# Patient Record
Sex: Female | Born: 1945
Health system: Southern US, Community
[De-identification: ages and names within clinical notes are randomized; demographics above are authoritative.]

## PROBLEM LIST (undated history)

## (undated) DIAGNOSIS — E538 Deficiency of other specified B group vitamins: Secondary | ICD-10-CM

## (undated) DIAGNOSIS — N35919 Unspecified urethral stricture, male, unspecified site: Secondary | ICD-10-CM

## (undated) DIAGNOSIS — K219 Gastro-esophageal reflux disease without esophagitis: Secondary | ICD-10-CM

## (undated) DIAGNOSIS — F419 Anxiety disorder, unspecified: Secondary | ICD-10-CM

## (undated) DIAGNOSIS — K449 Diaphragmatic hernia without obstruction or gangrene: Secondary | ICD-10-CM

## (undated) DIAGNOSIS — G4733 Obstructive sleep apnea (adult) (pediatric): Secondary | ICD-10-CM

## (undated) DIAGNOSIS — E049 Nontoxic goiter, unspecified: Secondary | ICD-10-CM

## (undated) DIAGNOSIS — K760 Fatty (change of) liver, not elsewhere classified: Secondary | ICD-10-CM

## (undated) HISTORY — DX: Unspecified urethral stricture, male, unspecified site: N35.919

## (undated) HISTORY — PX: APPENDECTOMY: SHX54

## (undated) HISTORY — DX: Obstructive sleep apnea (adult) (pediatric): G47.33

## (undated) HISTORY — DX: Diaphragmatic hernia without obstruction or gangrene: K44.9

## (undated) HISTORY — DX: Gastro-esophageal reflux disease without esophagitis: K21.9

## (undated) HISTORY — DX: Nontoxic goiter, unspecified: E04.9

## (undated) HISTORY — DX: Anxiety disorder, unspecified: F41.9

## (undated) HISTORY — DX: Fatty (change of) liver, not elsewhere classified: K76.0

## (undated) HISTORY — DX: Deficiency of other specified B group vitamins: E53.8

---

## 2000-11-26 ENCOUNTER — Encounter: Admission: RE | Admit: 2000-11-26 | Discharge: 2001-02-24 | Payer: Self-pay | Admitting: Internal Medicine

## 2001-09-21 ENCOUNTER — Emergency Department (HOSPITAL_COMMUNITY): Admission: EM | Admit: 2001-09-21 | Discharge: 2001-09-21 | Payer: Self-pay | Admitting: Emergency Medicine

## 2001-09-21 ENCOUNTER — Encounter: Payer: Self-pay | Admitting: Emergency Medicine

## 2003-07-11 HISTORY — PX: ESOPHAGOGASTRODUODENOSCOPY: SHX1529

## 2003-09-02 ENCOUNTER — Encounter: Admission: RE | Admit: 2003-09-02 | Discharge: 2003-09-02 | Payer: Self-pay | Admitting: Internal Medicine

## 2003-09-04 ENCOUNTER — Encounter: Admission: RE | Admit: 2003-09-04 | Discharge: 2003-09-04 | Payer: Self-pay | Admitting: Internal Medicine

## 2003-11-17 ENCOUNTER — Emergency Department (HOSPITAL_COMMUNITY): Admission: EM | Admit: 2003-11-17 | Discharge: 2003-11-18 | Payer: Self-pay | Admitting: Emergency Medicine

## 2003-11-19 ENCOUNTER — Ambulatory Visit (HOSPITAL_BASED_OUTPATIENT_CLINIC_OR_DEPARTMENT_OTHER): Admission: RE | Admit: 2003-11-19 | Discharge: 2003-11-19 | Payer: Self-pay | Admitting: Urology

## 2003-12-09 ENCOUNTER — Encounter: Payer: Self-pay | Admitting: Gastroenterology

## 2004-05-02 ENCOUNTER — Encounter: Payer: Self-pay | Admitting: Gastroenterology

## 2004-10-24 ENCOUNTER — Ambulatory Visit: Payer: Self-pay | Admitting: Internal Medicine

## 2004-11-11 ENCOUNTER — Ambulatory Visit: Payer: Self-pay | Admitting: Internal Medicine

## 2004-12-14 ENCOUNTER — Ambulatory Visit: Payer: Self-pay | Admitting: Internal Medicine

## 2005-02-27 ENCOUNTER — Ambulatory Visit: Payer: Self-pay | Admitting: Internal Medicine

## 2005-03-22 ENCOUNTER — Ambulatory Visit: Payer: Self-pay | Admitting: Internal Medicine

## 2005-06-22 ENCOUNTER — Ambulatory Visit: Payer: Self-pay | Admitting: Gastroenterology

## 2006-04-30 ENCOUNTER — Ambulatory Visit: Payer: Self-pay | Admitting: Internal Medicine

## 2006-04-30 LAB — CONVERTED CEMR LAB
ALT: 38 units/L (ref 0–40)
CO2: 26 meq/L (ref 19–32)
Calcium: 9.1 mg/dL (ref 8.4–10.5)
Chloride: 105 meq/L (ref 96–112)
Creatinine, Ser: 1.1 mg/dL (ref 0.4–1.2)
Free T4: 0.6 ng/dL — ABNORMAL LOW (ref 0.9–1.8)
Glucose, Bld: 101 mg/dL — ABNORMAL HIGH (ref 70–99)
Hemoglobin: 12.6 g/dL (ref 12.0–15.0)
MCV: 94.6 fL (ref 78.0–100.0)
Platelets: 172 10*3/uL (ref 150–400)
RBC: 3.98 M/uL (ref 3.87–5.11)
RDW: 12.7 % (ref 11.5–14.6)
Sodium: 139 meq/L (ref 135–145)
T3, Free: 2.6 pg/mL (ref 2.3–4.2)
TSH: 3.33 microintl units/mL (ref 0.35–5.50)

## 2006-06-29 ENCOUNTER — Ambulatory Visit: Payer: Self-pay | Admitting: Gastroenterology

## 2006-12-10 ENCOUNTER — Ambulatory Visit: Payer: Self-pay | Admitting: Internal Medicine

## 2006-12-10 DIAGNOSIS — R5381 Other malaise: Secondary | ICD-10-CM | POA: Insufficient documentation

## 2006-12-10 DIAGNOSIS — R5383 Other fatigue: Secondary | ICD-10-CM

## 2006-12-10 DIAGNOSIS — N951 Menopausal and female climacteric states: Secondary | ICD-10-CM | POA: Insufficient documentation

## 2006-12-10 DIAGNOSIS — F411 Generalized anxiety disorder: Secondary | ICD-10-CM | POA: Insufficient documentation

## 2006-12-28 LAB — CONVERTED CEMR LAB
Basophils Absolute: 0 10*3/uL (ref 0.0–0.1)
Basophils Relative: 0.4 % (ref 0.0–1.0)
Free T4: 0.7 ng/dL (ref 0.6–1.6)
HCT: 36.7 % (ref 36.0–46.0)
Hemoglobin: 12.8 g/dL (ref 12.0–15.0)
MCHC: 34.8 g/dL (ref 30.0–36.0)
Monocytes Absolute: 0.3 10*3/uL (ref 0.2–0.7)
Neutrophils Relative %: 52.3 % (ref 43.0–77.0)
RBC: 3.93 M/uL (ref 3.87–5.11)
RDW: 12.8 % (ref 11.5–14.6)
T3, Free: 2.6 pg/mL (ref 2.3–4.2)
TSH: 3.26 microintl units/mL (ref 0.35–5.50)
WBC: 3.3 10*3/uL — ABNORMAL LOW (ref 4.5–10.5)

## 2007-01-02 ENCOUNTER — Encounter (INDEPENDENT_AMBULATORY_CARE_PROVIDER_SITE_OTHER): Payer: Self-pay | Admitting: *Deleted

## 2007-02-12 ENCOUNTER — Encounter: Payer: Self-pay | Admitting: Internal Medicine

## 2007-02-18 ENCOUNTER — Telehealth (INDEPENDENT_AMBULATORY_CARE_PROVIDER_SITE_OTHER): Payer: Self-pay | Admitting: *Deleted

## 2007-02-20 DIAGNOSIS — N35919 Unspecified urethral stricture, male, unspecified site: Secondary | ICD-10-CM | POA: Insufficient documentation

## 2007-02-21 ENCOUNTER — Other Ambulatory Visit: Admission: RE | Admit: 2007-02-21 | Discharge: 2007-02-21 | Payer: Self-pay | Admitting: Obstetrics & Gynecology

## 2007-10-04 ENCOUNTER — Telehealth (INDEPENDENT_AMBULATORY_CARE_PROVIDER_SITE_OTHER): Payer: Self-pay | Admitting: *Deleted

## 2008-05-04 ENCOUNTER — Telehealth (INDEPENDENT_AMBULATORY_CARE_PROVIDER_SITE_OTHER): Payer: Self-pay | Admitting: *Deleted

## 2008-07-09 ENCOUNTER — Ambulatory Visit: Payer: Self-pay | Admitting: Internal Medicine

## 2008-07-20 ENCOUNTER — Telehealth (INDEPENDENT_AMBULATORY_CARE_PROVIDER_SITE_OTHER): Payer: Self-pay | Admitting: *Deleted

## 2008-07-20 LAB — CONVERTED CEMR LAB
BUN: 24 mg/dL — ABNORMAL HIGH (ref 6–23)
Calcium: 9.4 mg/dL (ref 8.4–10.5)
Cholesterol: 252 mg/dL (ref 0–200)
Eosinophils Absolute: 0.1 10*3/uL (ref 0.0–0.7)
Eosinophils Relative: 2.3 % (ref 0.0–5.0)
GFR calc Af Amer: 93 mL/min
GFR calc non Af Amer: 77 mL/min
Glucose, Bld: 96 mg/dL (ref 70–99)
HCT: 39.1 % (ref 36.0–46.0)
Hemoglobin: 13.5 g/dL (ref 12.0–15.0)
MCV: 92.9 fL (ref 78.0–100.0)
Monocytes Absolute: 0.3 10*3/uL (ref 0.1–1.0)
Monocytes Relative: 9.5 % (ref 3.0–12.0)
Neutro Abs: 1.5 10*3/uL (ref 1.4–7.7)
Platelets: 143 10*3/uL — ABNORMAL LOW (ref 150–400)
RDW: 12.2 % (ref 11.5–14.6)
TSH: 4.56 microintl units/mL (ref 0.35–5.50)
Total CHOL/HDL Ratio: 6.1
Triglycerides: 129 mg/dL (ref 0–149)
WBC: 3.2 10*3/uL — ABNORMAL LOW (ref 4.5–10.5)

## 2008-09-07 ENCOUNTER — Telehealth (INDEPENDENT_AMBULATORY_CARE_PROVIDER_SITE_OTHER): Payer: Self-pay | Admitting: *Deleted

## 2008-09-10 ENCOUNTER — Ambulatory Visit: Payer: Self-pay | Admitting: Internal Medicine

## 2008-09-15 ENCOUNTER — Telehealth: Payer: Self-pay | Admitting: Internal Medicine

## 2008-09-15 LAB — CONVERTED CEMR LAB
ALT: 37 units/L — ABNORMAL HIGH (ref 0–35)
AST: 28 units/L (ref 0–37)
Alkaline Phosphatase: 55 units/L (ref 39–117)
Bilirubin, Direct: 0.1 mg/dL (ref 0.0–0.3)
Total Bilirubin: 0.9 mg/dL (ref 0.3–1.2)

## 2008-11-16 ENCOUNTER — Other Ambulatory Visit: Admission: RE | Admit: 2008-11-16 | Discharge: 2008-11-16 | Payer: Self-pay | Admitting: Obstetrics and Gynecology

## 2009-01-18 DIAGNOSIS — K449 Diaphragmatic hernia without obstruction or gangrene: Secondary | ICD-10-CM | POA: Insufficient documentation

## 2009-01-21 ENCOUNTER — Ambulatory Visit: Payer: Self-pay | Admitting: Gastroenterology

## 2009-01-21 DIAGNOSIS — K219 Gastro-esophageal reflux disease without esophagitis: Secondary | ICD-10-CM | POA: Insufficient documentation

## 2009-10-13 ENCOUNTER — Telehealth (INDEPENDENT_AMBULATORY_CARE_PROVIDER_SITE_OTHER): Payer: Self-pay | Admitting: *Deleted

## 2009-10-14 ENCOUNTER — Encounter (INDEPENDENT_AMBULATORY_CARE_PROVIDER_SITE_OTHER): Payer: Self-pay | Admitting: *Deleted

## 2010-01-11 ENCOUNTER — Ambulatory Visit: Payer: Self-pay | Admitting: Internal Medicine

## 2010-01-13 ENCOUNTER — Ambulatory Visit: Payer: Self-pay | Admitting: Internal Medicine

## 2010-01-13 ENCOUNTER — Encounter (INDEPENDENT_AMBULATORY_CARE_PROVIDER_SITE_OTHER): Payer: Self-pay | Admitting: *Deleted

## 2010-01-13 LAB — CONVERTED CEMR LAB
HCV Ab: NEGATIVE
Hep A Total Ab: NEGATIVE

## 2010-01-25 ENCOUNTER — Telehealth: Payer: Self-pay | Admitting: Internal Medicine

## 2010-01-27 ENCOUNTER — Encounter: Admission: RE | Admit: 2010-01-27 | Discharge: 2010-01-27 | Payer: Self-pay | Admitting: Internal Medicine

## 2010-02-14 ENCOUNTER — Ambulatory Visit: Payer: Self-pay | Admitting: Gastroenterology

## 2010-02-14 DIAGNOSIS — D72819 Decreased white blood cell count, unspecified: Secondary | ICD-10-CM | POA: Insufficient documentation

## 2010-02-15 ENCOUNTER — Encounter: Payer: Self-pay | Admitting: Internal Medicine

## 2010-02-15 DIAGNOSIS — E538 Deficiency of other specified B group vitamins: Secondary | ICD-10-CM | POA: Insufficient documentation

## 2010-02-15 LAB — CONVERTED CEMR LAB
Folate: >20 ng/mL
Iron: 114 ug/dL (ref 42–145)

## 2010-02-18 ENCOUNTER — Ambulatory Visit: Payer: Self-pay | Admitting: Internal Medicine

## 2010-02-18 LAB — CONVERTED CEMR LAB
ANA Titer 1: 1:40 {titer} — ABNORMAL HIGH
Anti Nuclear Antibody(ANA): POSITIVE — AB

## 2010-02-25 ENCOUNTER — Telehealth (INDEPENDENT_AMBULATORY_CARE_PROVIDER_SITE_OTHER): Payer: Self-pay | Admitting: *Deleted

## 2010-02-25 ENCOUNTER — Ambulatory Visit: Payer: Self-pay | Admitting: Internal Medicine

## 2010-03-04 ENCOUNTER — Ambulatory Visit: Payer: Self-pay | Admitting: Internal Medicine

## 2010-03-17 ENCOUNTER — Ambulatory Visit: Payer: Self-pay | Admitting: Gastroenterology

## 2010-03-25 ENCOUNTER — Ambulatory Visit (HOSPITAL_COMMUNITY): Admission: RE | Admit: 2010-03-25 | Discharge: 2010-03-25 | Payer: Self-pay | Admitting: Gastroenterology

## 2010-03-31 ENCOUNTER — Ambulatory Visit: Payer: Self-pay | Admitting: Gastroenterology

## 2010-03-31 DIAGNOSIS — K7689 Other specified diseases of liver: Secondary | ICD-10-CM | POA: Insufficient documentation

## 2010-04-08 ENCOUNTER — Ambulatory Visit: Payer: Self-pay | Admitting: Family Medicine

## 2010-05-06 ENCOUNTER — Ambulatory Visit: Payer: Self-pay | Admitting: Internal Medicine

## 2010-07-05 ENCOUNTER — Ambulatory Visit: Payer: Self-pay | Admitting: Gastroenterology

## 2010-07-08 ENCOUNTER — Telehealth: Payer: Self-pay | Admitting: Gastroenterology

## 2010-07-12 ENCOUNTER — Telehealth: Payer: Self-pay | Admitting: Gastroenterology

## 2010-07-13 ENCOUNTER — Other Ambulatory Visit: Payer: Self-pay | Admitting: Gastroenterology

## 2010-07-13 LAB — HEPATIC FUNCTION PANEL
ALT: 43 U/L — ABNORMAL HIGH (ref 0–35)
AST: 33 U/L (ref 0–37)
Albumin: 3.8 g/dL (ref 3.5–5.2)
Alkaline Phosphatase: 57 U/L (ref 39–117)
Bilirubin, Direct: 0 mg/dL (ref 0.0–0.3)
Total Bilirubin: 0.7 mg/dL (ref 0.3–1.2)
Total Protein: 7.2 g/dL (ref 6.0–8.3)

## 2010-07-15 ENCOUNTER — Ambulatory Visit
Admission: RE | Admit: 2010-07-15 | Discharge: 2010-07-15 | Payer: Self-pay | Source: Home / Self Care | Attending: Internal Medicine | Admitting: Internal Medicine

## 2010-07-29 ENCOUNTER — Ambulatory Visit: Admit: 2010-07-29 | Payer: Self-pay | Admitting: Gastroenterology

## 2010-08-07 LAB — CONVERTED CEMR LAB
Albumin: 3.9 g/dL (ref 3.5–5.2)
BUN: 25 mg/dL — ABNORMAL HIGH (ref 6–23)
Basophils Absolute: 0 10*3/uL (ref 0.0–0.1)
Basophils Relative: 0.6 % (ref 0.0–3.0)
Bilirubin, Direct: 0.1 mg/dL (ref 0.0–0.3)
CO2: 27 meq/L (ref 19–32)
Chloride: 111 meq/L (ref 96–112)
Creatinine, Ser: 1.1 mg/dL (ref 0.4–1.2)
Eosinophils Absolute: 0.1 10*3/uL (ref 0.0–0.7)
Glucose, Bld: 96 mg/dL (ref 70–99)
HDL: 50 mg/dL (ref >39.00)
MCHC: 34.8 g/dL (ref 30.0–36.0)
MCV: 93.9 fL (ref 78.0–100.0)
Monocytes Absolute: 0.3 10*3/uL (ref 0.1–1.0)
Neutrophils Relative %: 47.6 % (ref 43.0–77.0)
Pap Smear: NORMAL
RBC: 4.05 M/uL (ref 3.87–5.11)
RDW: 13.4 % (ref 11.5–14.6)
TSH: 3.98 microintl units/mL (ref 0.35–5.50)
Total Protein: 6.9 g/dL (ref 6.0–8.3)
Triglycerides: 125 mg/dL (ref 0.0–149.0)

## 2010-08-09 ENCOUNTER — Ambulatory Visit
Admission: RE | Admit: 2010-08-09 | Discharge: 2010-08-09 | Payer: Self-pay | Source: Home / Self Care | Attending: Gastroenterology | Admitting: Gastroenterology

## 2010-08-09 NOTE — Assessment & Plan Note (Signed)
Summary: follow up liver bx/lk    History of Present Illness Visit Type: Follow-up Visit Primary GI MD: Verl Blalock MD [place] Primary Provider: Kathlene November, MD Requesting Provider: na Chief Complaint: Follow up from Liver Biopsy, Pt has no GI complaints History of Present Illness:   liver biopsy reviewed with pathology,and serologic and metabolic tests reviewed. She has a weakly positive ANA of 1-40 with a negative AMA. Workup for hepatitis B. and C. is negative. Her BMI is in the normal range and she is not diabetic. CDT level for alcohol excess was normal. Patient remains asymptomatic. Review of her liver enzymes shows transaminase elevations less than twice normal for greater than one year.   GI Review of Systems      Denies abdominal pain, acid reflux, belching, bloating, chest pain, dysphagia with liquids, dysphagia with solids, heartburn, loss of appetite, nausea, vomiting, vomiting blood, weight loss, and  weight gain.        Denies anal fissure, black tarry stools, change in bowel habit, constipation, diarrhea, diverticulosis, fecal incontinence, heme positive stool, hemorrhoids, irritable bowel syndrome, jaundice, light color stool, liver problems, rectal bleeding, and  rectal pain.    Current Medications (verified): 1)  Citalopram Hydrobromide 20 Mg Tabs (Citalopram Hydrobromide) .[name].. 1 By Mouth Once Daily 2)  Aciphex 20 Mg Tbec (Rabeprazole Sodium) .[name].. 1 By Mouth Qd 3)  Multivitamins  Caps (Multiple Vitamin) .[name].. 1 By Mouth Once Daily 4)  Fish Oil 1000 Mg Caps (Omega-3 Fatty Acids) .[name].. 1 Capsule By Mouth Once Daily 5)  Resveratrol 100 Mg Caps (Resveratrol) .... Take 1 Cap Once Daily 6)  Coq10 100 Mg Caps (Coenzyme Q10) .... Once Daily 7)  B Complex 100  Tabs (B Complex Vitamins) .... Once Daily 8)  Calcium-Magnesium-Vitamin D 400-166.7-133.3 Mg-Mg-Uni Tabs (Calcium-Magnesium-Vitamin D) .... Once Daily 9)  Kelp  Tabs (Iodine (Kelp)) .... Take 145m Once Daily 10)   Evening Primrose Oil 1000 Mg Caps (Evening Primrose Oil) .... Once Daily 11)  Milk Thistle 200 Mg Caps (Milk Thistle) .... Once Daily 12)  Psyllum 20034m.... Once Daily 13)  Cyanocobalamin 1000 Mcg/ml Inj Soln (Cyanocobalamin) ...[name] 1 Cc Im Weekly X 3 Then Monthly 14)  Vitamin D (Ergocalciferol) 50000 Unit Caps (Ergocalciferol) .... One Capsule By Mouth Once A Week  Allergies (verified): 1)  ! * Advair  Past History:  Family History: Last updated: 01/11/2010 CAD - MGF (MI late in life) stroke - no DM - no HTN - MGF colon Ca - no breast Ca - M aunt M - deceased scleroderma F - deceased emphysema +smoker  Social History: Last updated: 01/11/2010 Single Never Smoked Alcohol use-yes-occ no children Occupation:carrer maBiochemist, clinicalIllicit Drug Use - no exercise-- walks 2 miles once daily  diet-- needs improvment   Past medical, surgical, family and social histories (including risk factors) reviewed for relevance to current acute and chronic problems.  Past Medical History: Reviewed history from 07/09/2008 and no changes required. Goiter Vit D deficinecy (f/u per gyn) Anxiety (panik attacks) 2005: CT of the chest and  ultrasound of the legs negative for PE or DVT.  Stress test negative 2005: history of a urethral stricture, status post dilatation 2005: EGD showed HHDickensPast Surgical History: Reviewed history from 07/09/2008 and no changes required. Appendectomy  Family History: Reviewed history from 01/11/2010 and no changes required. CAD - MGF (MI late in life) stroke - no DM - no HTN - MGF colon Ca - no breast Ca - M  aunt M - deceased scleroderma F - deceased emphysema +smoker  Social History: Reviewed history from 01/11/2010 and no changes required. Single Never Smoked Alcohol use-yes-occ no children Occupation:carrer Biochemist, clinical  Illicit Drug Use - no exercise-- walks 2 miles once daily  diet-- needs improvment   Review of  Systems  The patient denies allergy/sinus, anemia, anxiety-new, arthritis/joint pain, back pain, blood in urine, breast changes/lumps, change in vision, confusion, cough, coughing up blood, depression-new, fainting, fatigue, fever, headaches-new, hearing problems, heart murmur, heart rhythm changes, itching, menstrual pain, muscle pains/cramps, night sweats, nosebleeds, pregnancy symptoms, shortness of breath, skin rash, sleeping problems, sore throat, swelling of feet/legs, swollen lymph glands, thirst - excessive , urination - excessive , urination changes/pain, urine leakage, vision changes, and voice change.    Vital Signs:  Patient profile:   65 year old female Height:      72 inches Weight:      200 pounds BMI:     27.22 BSA:     2.13 Pulse rate:   72 / minute Pulse rhythm:   regular BP sitting:   100 / 60  (left arm)  Vitals Entered By: [place] Deborra Medina) (March 31, 2010 8:36 AM)  Physical Exam  General:  Well developed, well nourished, no acute distress. Head:  Normocephalic and atraumatic. Eyes:  PERRLA, no icterus.exam deferred to patient's ophthalmologist.   Psych:  Alert and cooperative. Normal mood and affect.   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 Her liver slides have been sent for further examination by other hepatologists.We will check her liver function test in 3 months with office visit in 4 months. There is no clear treatment for her [name] syndrome at this time. She has no approximate 25% chance of future cirrhosis. I have offered her referral to [facility] for another opinion, she seems content with cautious observation currently. I have advised her to moderate her alcohol intake and to avoid unnecessary medications. She otherwise continue medical followup with Dr. Larose Kells as  scheduled.  Problem # 2:  VITAMIN B12 DEFICIENCY (ICD-266.2) Assessment: Improved She is on parenteral replacement therapy for her B12 deficiency.  Problem # 3:  GERD (ICD-530.81) Assessment: Improved Continue AcipHex daily and antireflux maneuvers.  Patient Instructions: 1)  Copy sent to : Kathlene November, MD 2)  Please continue current medications.  3)  We will contact you when it is time to have you liver function test drawn in 3 months.  4)  The medication list was reviewed and reconciled.  All changed / newly prescribed medications were explained.  A complete medication list was provided to the patient / caregiver. 5)  Fatty Liver handout given.

## 2010-08-09 NOTE — Assessment & Plan Note (Signed)
Summary: ELEVATED LFTS...EM    History of Present Illness Visit Type: Follow-up Consult Primary GI MD: Verl Blalock MD [place] Primary Provider: Kathlene November, MD Requesting Provider: Kathlene November, MD Chief Complaint: abnormal liver function tests History of Present Illness:   65 year old Caucasian female with mildly abnormal liver function test for greater than a year positive hepatitis B-E antigen. I cannot find a hepatitis B surface antigen. Hepatitis A and C. antibodies were negative.  Patient denies any symptomatology except for mild fatigue. She is on daily AcipHex for chronic GERD. There is no history of hepatitis, icterus, abdominal pain, or any symptoms of hepatic insufficiency. She does take a variety of multivitamins but not vitamin A. She recently started p.o. milk thistle. She does use wine daily but denies problems with alcohol abuse. She's been divorced for 25 years.  Her mother apparently has scleroderma. Patient has no known history of autoimmune disease. Recent ultrasound showed a right hepatic cyst but otherwise was unremarkable. She also has a history of chronic depression and panic attacks.  Labs also showed mild leukopenia with a white count 3100, platelet count 137,000, hemoglobin 13.2, normal bilirubin, SGOT 47, SGPT 68, and serum albumin 3.9 g percent. There is no evidence of splenomegaly on ultrasound exam but apparently spleen was borderline enlarged.   GI Review of Systems    Reports acid reflux and  belching.      Denies abdominal pain, bloating, chest pain, dysphagia with liquids, dysphagia with solids, heartburn, loss of appetite, nausea, vomiting, vomiting blood, weight loss, and  weight gain.        Denies anal fissure, black tarry stools, change in bowel habit, constipation, diarrhea, diverticulosis, fecal incontinence, heme positive stool, hemorrhoids, irritable bowel syndrome, jaundice, light color stool, liver problems, rectal bleeding, and  rectal pain.     Current Medications (verified): 1)  Citalopram Hydrobromide 20 Mg Tabs (Citalopram Hydrobromide) .[name].. 1 By Mouth Once Daily 2)  Aciphex 20 Mg Tbec (Rabeprazole Sodium) .[name].. 1 By Mouth Qd 3)  Multivitamins  Caps (Multiple Vitamin) .[name].. 1 By Mouth Once Daily 4)  Fish Oil 1000 Mg Caps (Omega-3 Fatty Acids) .[name].. 1 Capsule By Mouth Once Daily 5)  Resveratrol 100 Mg Caps (Resveratrol) .... Take 1 Cap Once Daily 6)  Coq10 100 Mg Caps (Coenzyme Q10) .... Once Daily 7)  B Complex 100  Tabs (B Complex Vitamins) .... Once Daily 8)  Calcium-Magnesium-Vitamin D 400-166.7-133.3 Mg-Mg-Uni Tabs (Calcium-Magnesium-Vitamin D) .... Once Daily 9)  Kelp  Tabs (Iodine (Kelp)) .... Take 131m Once Daily 10)  Evening Primrose Oil 1000 Mg Caps (Evening Primrose Oil) .... Once Daily 11)  Milk Thistle 200 Mg Caps (Milk Thistle) .... Once Daily 12)  Psyllum 20076m.... Once Daily  Allergies (verified): 1)  ! * Advair  Past History:  Past medical, surgical, family and social histories (including risk factors) reviewed for relevance to current acute and chronic problems.  Past Medical History: Reviewed history from 07/09/2008 and no changes required. Goiter Vit D deficinecy (f/u per gyn) Anxiety (panik attacks) 2005: CT of the chest and  ultrasound of the legs negative for PE or DVT.  Stress test negative 2005: history of a urethral stricture, status post dilatation 2005: EGD showed HHC[place] HillsPast Surgical History: Reviewed history from 07/09/2008 and no changes required. Appendectomy  Family History: Reviewed history from 01/11/2010 and no changes required. CAD - MGF (MI late in life) stroke - no DM - no HTN - MGF colon Ca - no breast Ca -  M aunt M - deceased scleroderma F - deceased emphysema +smoker  Social History: Reviewed history from 01/11/2010 and no changes required. Single Never Smoked Alcohol use-yes-occ no children Occupation:carrer Biochemist, clinical  Illicit Drug Use -  no exercise-- walks 2 miles once daily  diet-- needs improvment   Review of Systems       The patient complains of fatigue.  The patient denies allergy/sinus, anemia, anxiety-new, arthritis/joint pain, back pain, blood in urine, breast changes/lumps, change in vision, confusion, cough, coughing up blood, depression-new, fainting, fever, headaches-new, hearing problems, heart murmur, heart rhythm changes, itching, menstrual pain, muscle pains/cramps, night sweats, nosebleeds, pregnancy symptoms, shortness of breath, skin rash, sleeping problems, sore throat, swelling of feet/legs, swollen lymph glands, thirst - excessive , urination - excessive , urination changes/pain, urine leakage, vision changes, and voice change.    Vital Signs:  Patient profile:   65 year old female Height:      72 inches Weight:      197.25 pounds BMI:     26.85 Pulse rate:   72 / minute Pulse rhythm:   regular BP sitting:   100 / 80  (right arm) Cuff size:   regular  Vitals Entered By: June McMurray [place] Deborra Medina) (February 14, 2010 8:36 AM)  Physical Exam  General:  Well developed, well nourished, no acute distress.No stigmata of chronic liver disease noted. Head:  Normocephalic and atraumatic. Eyes:  PERRLA, no icterus.exam deferred to patient's ophthalmologist.   Lungs:  Clear throughout to auscultation. Heart:  Regular rate and rhythm; no murmurs, rubs,  or bruits. Abdomen:  Soft, nontender and nondistended. No masses, hepatosplenomegaly or hernias noted. Normal bowel sounds. Msk:  Symmetrical with no gross deformities. Normal posture. Pulses:  Normal pulses noted. Extremities:  No clubbing, cyanosis, edema or deformities noted. Neurologic:  Alert and  oriented x4;  grossly normal neurologically. Skin:  dry skin senile keratoses noted but no other significant rashes or skin lesions Psych:  Alert and cooperative. Normal mood and affect.   Impression & Recommendations:  Problem # 1:  HEPATITIS B, CHRONIC  (ICD-070.32) Assessment Unchanged Further labs ordered including hepatitis B surface antigen and hepatitis B quantitation. We also will check ANA, prothrombin time, iron levels, etc. I have informed the patient that we will probably need to refer her to the [facility] hepatitis clinic for treatment in [place]. She will probably need liver biopsy also either here or in [facility].Ultrasound suggested fatty infiltration of the liver and borderline splenomegaly. She does have a history of hyperlipidemia but is not on statin medication. The amount of alcohol ingestion is unclear and I will check a CPT level. Orders: TLB-B12 + Folate Pnl (46503_54656-C12/XNT) TLB-IBC Pnl (Iron/FE;Transferrin) (83550-IBC) TLB-Ferritin (82728-FER) T-Alpha-1-Antitrypsin Tot (70017-49449) T-AMA [phone]) T-ANA [phone]) T-Anti SMA (79390-30092) T-Ceruloplasmin ([phone]) T-Hepatitis B Surface Antigen (33545-62563) T-Hepatitis B DNA Quant (89373) T-Hepatitis D Antibody (42876-81157)  Problem # 2:  GERD (ICD-530.81) Assessment: Improved continue daily AcipHex 20 mg.  Problem # 3:  LEUKOPENIA, MILD (ICD-288.50) Assessment: New Borderline splenomegaly and perhaps congestion with low WBC and borderline platelet count. Of course, she could have chronic low-grade cirrhosis.  Patient Instructions: 1)  Please go to the basement to have your lab tests drawn today.  2)  We will call you with further follow up after reviewing these results. 3)  The medication list was reviewed and reconciled.  All changed / newly prescribed medications were explained.  A complete medication list was provided to  the patient / caregiver. 4)  Hepatitis B brochure given.   Appended Document: Orders Update Clinical Lists Changes  Orders: Added new Test order of T-CDT (carbohydrate deficient transferrin) ([id number]) - Signed

## 2010-08-09 NOTE — Progress Notes (Signed)
Summary: b12 inj  Phone Note Outgoing Call   Summary of Call: Please call pt and schedule B12 injections. Received a letter she would like to get these done here. [place]  February 25, 2010 12:50 PM Needs weekly for 3 weeks then monthly. Allyn Kenner CMA  February 25, 2010 12:50 PM   Follow-up for Phone Call        pt already has appts scheduled.Kathie Dike Negrete  February 25, 2010 2:10 PM

## 2010-08-09 NOTE — Assessment & Plan Note (Signed)
Summary: B-12 inj and Flu vacc/kb   Nurse Visit   Allergies: 1)  ! * Advair  Medication Administration  Injection # 1:    Medication: Vit B12 1000 mcg    Diagnosis: VITAMIN B12 DEFICIENCY (ICD-266.2)    Route: IM    Site: L deltoid    Exp Date: 09/08/2011    Lot #: [id number]    Mfr: American Regent    Patient tolerated injection without complications    Given by: Ernestene Mention CMA (April 08, 2010 8:12 AM)  Orders Added: 1)  Vit B12 1000 mcg [J3420] 2)  Admin of Therapeutic Inj  intramuscular or subcutaneous [96372] 3)  Admin 1st Vaccine [90471] 4)  Flu Vaccine 75yr + [[12258]        Flu Vaccine Consent Questions     Do you have a history of severe allergic reactions to this vaccine? no    Any prior history of allergic reactions to egg and/or gelatin? no    Do you have a sensitivity to the preservative Thimersol? no    Do you have a past history of Guillan-Barre Syndrome? no    Do you currently have an acute febrile illness? no    Have you ever had a severe reaction to latex? no    Vaccine information given and explained to patient? yes    Are you currently pregnant? no    Lot Number:[id number]   Exp Date:01/07/2011   Site Given  Left Deltoid IM

## 2010-08-09 NOTE — Assessment & Plan Note (Signed)
Summary: B-12//PH   Nurse Visit   Allergies: 1)  ! * Advair  Medication Administration  Injection # 1:    Medication: Vit B12 1000 mcg    Diagnosis: VITAMIN B12 DEFICIENCY (ICD-266.2)    Route: IM    Site: L deltoid    Exp Date: 09/2011    Lot #: [id number]    Mfr: American Regent    Patient tolerated injection without complications    Given by: Allyn Kenner CMA (February 25, 2010 3:42 PM)  Orders Added: 1)  Admin of Therapeutic Inj  intramuscular or subcutaneous [96372] 2)  Vit B12 1000 mcg [L2493]

## 2010-08-09 NOTE — Assessment & Plan Note (Signed)
Summary: B-12//PH   Nurse Visit   Allergies: 1)  ! * Advair  Medication Administration  Injection # 1:    Medication: Vit B12 1000 mcg    Diagnosis: VITAMIN B12 DEFICIENCY (ICD-266.2)    Route: IM    Site: L deltoid    Exp Date: 09/2011    Lot #: [id number]    Mfr: American Regent    Patient tolerated injection without complications    Given by: Allyn Kenner CMA (February 18, 2010 8:36 AM)  Orders Added: 1)  Admin of Therapeutic Inj  intramuscular or subcutaneous [96372] 2)  Vit B12 1000 mcg [L4650]

## 2010-08-09 NOTE — Assessment & Plan Note (Signed)
Summary: B12/KN   Nurse Visit  CC: B-12 inj./kb   Allergies: 1)  ! * Advair  Medication Administration  Injection # 1:    Medication: Vit B12 1000 mcg    Diagnosis: VITAMIN B12 DEFICIENCY (ICD-266.2)    Route: IM    Site: L deltoid    Exp Date: 09/08/2011    Lot #: [id number]    Mfr: American Regent    Patient tolerated injection without complications    Given by: Ernestene Mention CMA (May 06, 2010 8:03 AM)  Orders Added: 1)  Vit B12 1000 mcg [J3420] 2)  Admin of Therapeutic Inj  intramuscular or subcutaneous [53391]

## 2010-08-09 NOTE — Assessment & Plan Note (Signed)
Summary: CPX,NO PAP,FASTING,AETNA INS/RH.....   Vital Signs:  Patient profile:   65 year old female Height:      72 inches Weight:      198.50 pounds BMI:     27.02 Temp:     98.4 degrees F oral Pulse rate:   67 / minute Pulse rhythm:   regular BP sitting:   118 / 70  (left arm) Cuff size:   large  Vitals Entered By: Allyn Kenner CMA (January 11, 2010 8:00 AM) CC: [id number]   History of Present Illness: CPX  Preventive Screening-Counseling & Management  Caffeine-Diet-Exercise     Does Patient Exercise: yes     Times/week: 7  Allergies: 1)  ! * Advair  Past History:  Past Medical History: Reviewed history from 07/09/2008 and no changes required. Goiter Vit D deficinecy (f/u per gyn) Anxiety (panik attacks) 2005: CT of the chest and  ultrasound of the legs negative for PE or DVT.  Stress test negative 2005: history of a urethral stricture, status post dilatation 2005: EGD showed [place]  Past Surgical History: Reviewed history from 07/09/2008 and no changes required. Appendectomy  Family History: Reviewed history from 07/09/2008 and no changes required. CAD - MGF (MI late in life) stroke - no DM - no HTN - MGF colon Ca - no breast Ca - M aunt M - deceased scleroderma F - deceased emphysema +smoker  Social History: Single Never Smoked Alcohol use-yes-occ no children Engineer, technical sales  Illicit Drug Use - no exercise-- walks 2 miles once daily  diet-- needs improvment  Does Patient Exercise:  yes  Review of Systems General:  Denies weight loss. CV:  Denies palpitations and swelling of feet; occasionally chest discomfort  for the last 2 years: "pressure",  w/  walking  or eating, decrease by belching, no radiation. She is able to walk 2 miles daily without problems. Resp:  Denies cough, shortness of breath, and wheezing. GI:  Denies bloody stools, diarrhea, and nausea. GU:  sees gyn . Psych:  Denies depression; symptoms well  controlled w/  citalopram .  Physical Exam  General:  alert, well-developed, and well-nourished.   Neck:  no masses and no thyromegaly.   Lungs:  normal respiratory effort, no intercostal retractions, no accessory muscle use, and normal breath sounds.   Heart:  normal rate, regular rhythm, no murmur, and no gallop.   Abdomen:  soft, non-tender, normal bowel sounds, no distention, no guarding, no rigidity, and no rebound tenderness. Barely  palpable aorta in the epigastric area without tenderness or bruit Extremities:  no lower extremity edema Neurologic:  alert & oriented X3, strength normal in all extremities, and gait normal.   Psych:  Cognition and judgment appear intact. Alert and cooperative with normal attention span and concentration.  not anxious appearing and not depressed appearing.     Impression & Recommendations:  Problem # 1:  HEALTH SCREENING (ICD-V70.0) Td 08 pneumonia shot 12-09 healthy diet discussed, continue w/ exercise  pt has GYN - last visit 2 weeks ago  MMG-- pending  Last PAP-- 2 weeks ago  Cscope 2005, unremarkable; was rec next in 10 years per patient   Other issues: barely palpable aorta without tenderness or bruit. Observation Chest discomfort with eating or exertion. See review of systems. EKG today within normal. Observation. Patient will call if anything changes or symptoms increase    Orders: Venipuncture (32355) TLB-BMP (Basic Metabolic Panel-BMET) (73220-URKYHCW) TLB-CBC Platelet - w/Differential (85025-CBCD) TLB-Hepatic/Liver Function Pnl (80076-HEPATIC) TLB-Lipid  Panel (80061-LIPID) TLB-TSH (Thyroid Stimulating Hormone) (84443-TSH) EKG w/ Interpretation (93000)  Problem # 2:       Complete Medication List: 1)  Citalopram Hydrobromide 20 Mg Tabs (Citalopram hydrobromide) .[name].. 1 by mouth once daily - no additional refills without office visit 2)  Aciphex 20 Mg Tbec (Rabeprazole sodium) .[name].. 1 by mouth qd 3)  Multivitamins Caps  (Multiple vitamin) .[name].. 1 by mouth once daily 4)  Fish Oil 1000 Mg Caps (Omega-3 fatty acids) .[name].. 1 capsule by mouth once daily  Patient Instructions: 1)  Please schedule a follow-up appointment in 1 year.    Risk Factors:  Exercise:  yes    Times per week:  7  PAP Smear History:     Date of Last PAP Smear:  12/28/2009    Results:  normal     Preventive Care Screening  Pap Smear:    Date:  12/28/2009    Results:  normal

## 2010-08-09 NOTE — Progress Notes (Signed)
Summary: lab results, GI referal, u/s(lmom 7/19)  Phone Note Outgoing Call   Summary of Call: -LFTs mildly elevated, serology consistent with prior Hep B  infection  -in light of the presence of thrombocytopenia will get a ultrasound of the abdomen and refer to GI (further w/u? )  -cholesterol is slightly elevated, for now diet and exercise -LMOM to call back  [place]. Theus Espin MD  January 25, 2010 9:49 AM   Follow-up for Phone Call        discussed  with the patient, aware of  the plan Helix Lafontaine E. Sylvester Minton MD  January 26, 2010 3:52 PM   New Problems: NONSPECIFIC ABNORM RESULTS OTH SPEC FUNCT STUDY (ICD-794.9)   New Problems: NONSPECIFIC ABNORM RESULTS OTH SPEC FUNCT STUDY (ICD-794.9)

## 2010-08-09 NOTE — Assessment & Plan Note (Signed)
Summary: B-12//PH   Nurse Visit   Allergies: 1)  ! * Advair  Medication Administration  Injection # 1:    Medication: Vit B12 1000 mcg    Diagnosis: VITAMIN B12 DEFICIENCY (ICD-266.2)    Route: IM    Site: L deltoid    Exp Date: 09/2011    Lot #: [id number]    Mfr: American Regent    Patient tolerated injection without complications    Given by: Allyn Kenner CMA (March 04, 2010 8:14 AM)  Orders Added: 1)  Admin of Therapeutic Inj  intramuscular or subcutaneous [96372] 2)  Vit B12 1000 mcg [J3420]

## 2010-08-09 NOTE — Progress Notes (Signed)
Summary: due cpx  Phone Note Outgoing Call Call back at [facility] Phone [phone] Call back at Work Phone [phone]   Summary of Call: DUE CPX Emily Parrish  October 13, 2009 8:54 AM     Additional Follow-up for Phone Call Additional follow up Details #2::    lmtcb Follow-up by: Silva Bandy,  October 13, 2009 9:14 AM  Additional Follow-up for Phone Call Additional follow up Details #3:: Details for Additional Follow-up Action Taken: MAILED A LETTER Additional Follow-up by: Silva Bandy,  October 14, 2009 2:50 PM

## 2010-08-09 NOTE — Letter (Signed)
Summary: [facility]  [facility]   Imported By: Edmonia James 02/24/2010 11:09:23  _____________________________________________________________________  External Attachment:    Type:   Image     Comment:   External Document  Appended Document: [facility] please be sure we follow up on this  Appended Document: [facility] see phone note

## 2010-08-09 NOTE — Assessment & Plan Note (Signed)
Summary: Follow UP/ dfs    History of Present Illness Visit Type: Follow-up Visit Primary GI MD: Verl Blalock MD [place] Primary Provider: Kathlene November, MD Requesting Provider: na Chief Complaint: F/u on labs. Pt c/o fatigue and denies any GI complaints  History of Present Illness:   65 year old Caucasian female who is asymptomatic, and is being evaluated for abnormal liver function test of unexplained etiology with complete negative serologic and metabolic and hepatitis workup except for serologies which showed previous hepatitis B exposure with antibody production. She has no hepatitis B DNA in her serum. All labs have been negative except for her weekly positive ANA of 1-40 with a speckled pattern. Anti-smooth muscle antibody has been negative. Ultrasound has shown possible fatty infiltration. She gives no history of severe hyperlipidemia, diabetes, or use of a statin medication. She is on B12 replacement and also takes p.o. milk thistle. Her mother does suffer from scleroderma. The patient denies other symptoms of collagen vascular disease.  She uses wine regularly but serum CDT levels were normal. There is no history of nonalcoholic hepatitis or pancreatitis or other complications of alcohol use. She does take a variety of multivitamins but no protein supplements. She specifically denies itching, mental status changes, recurrent skin rashes, abdominal swelling, but does have some mild chronic fatigue. She has not had previous liver biopsy.   GI Review of Systems      Denies abdominal pain, acid reflux, belching, bloating, chest pain, dysphagia with liquids, dysphagia with solids, heartburn, loss of appetite, nausea, vomiting, vomiting blood, weight loss, and  weight gain.        Denies anal fissure, black tarry stools, change in bowel habit, constipation, diarrhea, diverticulosis, fecal incontinence, heme positive stool, hemorrhoids, irritable bowel syndrome, jaundice, light color stool,  liver problems, rectal bleeding, and  rectal pain.    Current Medications (verified): 1)  Citalopram Hydrobromide 20 Mg Tabs (Citalopram Hydrobromide) .[name].. 1 By Mouth Once Daily 2)  Aciphex 20 Mg Tbec (Rabeprazole Sodium) .[name].. 1 By Mouth Qd 3)  Multivitamins  Caps (Multiple Vitamin) .[name].. 1 By Mouth Once Daily 4)  Fish Oil 1000 Mg Caps (Omega-3 Fatty Acids) .[name].. 1 Capsule By Mouth Once Daily 5)  Resveratrol 100 Mg Caps (Resveratrol) .... Take 1 Cap Once Daily 6)  Coq10 100 Mg Caps (Coenzyme Q10) .... Once Daily 7)  B Complex 100  Tabs (B Complex Vitamins) .... Once Daily 8)  Calcium-Magnesium-Vitamin D 400-166.7-133.3 Mg-Mg-Uni Tabs (Calcium-Magnesium-Vitamin D) .... Once Daily 9)  Kelp  Tabs (Iodine (Kelp)) .... Take 186m Once Daily 10)  Evening Primrose Oil 1000 Mg Caps (Evening Primrose Oil) .... Once Daily 11)  Milk Thistle 200 Mg Caps (Milk Thistle) .... Once Daily 12)  Psyllum 20017m.... Once Daily 13)  Cyanocobalamin 1000 Mcg/ml Inj Soln (Cyanocobalamin) ...[name] 1 Cc Im Weekly X 3 Then Monthly 14)  Vitamin D (Ergocalciferol) 50000 Unit Caps (Ergocalciferol) .... One Capsule By Mouth Once A Week  Allergies (verified): 1)  ! * Advair  Past History:  Past medical, surgical, family and social histories (including risk factors) reviewed for relevance to current acute and chronic problems.  Past Medical History: Reviewed history from 07/09/2008 and no changes required. Goiter Vit D deficinecy (f/u per gyn) Anxiety (panik attacks) 2005: CT of the chest and  ultrasound of the legs negative for PE or DVT.  Stress test negative 2005: history of a urethral stricture, status post dilatation 2005: [place] Surgical History: Reviewed history from 07/09/2008 and  no changes required. Appendectomy  Family History: Reviewed history from 01/11/2010 and no changes required. CAD - MGF (MI late in life) stroke - no DM - no HTN - MGF colon Ca - no breast Ca - M aunt M -  deceased scleroderma F - deceased emphysema +smoker  Social History: Reviewed history from 01/11/2010 and no changes required. Single Never Smoked Alcohol use-yes-occ no children Occupation:carrer Biochemist, clinical  Illicit Drug Use - no exercise-- walks 2 miles once daily  diet-- needs improvment   Review of Systems       The patient complains of fatigue.  The patient denies allergy/sinus, anemia, anxiety-new, arthritis/joint pain, back pain, blood in urine, breast changes/lumps, change in vision, confusion, cough, coughing up blood, depression-new, fainting, fever, headaches-new, hearing problems, heart murmur, heart rhythm changes, itching, menstrual pain, muscle pains/cramps, night sweats, nosebleeds, pregnancy symptoms, shortness of breath, skin rash, sleeping problems, sore throat, swelling of feet/legs, swollen lymph glands, thirst - excessive , urination - excessive , urination changes/pain, urine leakage, vision changes, and voice change.    Vital Signs:  Patient profile:   65 year old female Height:      72 inches Weight:      199 pounds BMI:     27.09 BSA:     2.13 Pulse rate:   74 / minute Pulse rhythm:   regular BP sitting:   100 / 60  (left arm) Cuff size:   regular  Vitals Entered By: Hope Pigeon CMA (March 17, 2010 8:54 AM)                                                                               Physical Exam  General:  Well developed, well nourished, no acute distress.I cannot appreciate stigmata of chronic liver disease. Head:  Normocephalic and atraumatic. Eyes:  PERRLA, no icterus.exam deferred to patient's ophthalmologist.   Abdomen:  No hepatosplenomegaly, abdominal masses, tenderness, or ascites. Extremities:  No clubbing, cyanosis, edema or deformities noted. Neurologic:  Alert and  oriented x4;  grossly normal neurologically. Psych:  Alert and cooperative. Normal mood and affect.   Impression & Recommendations:  Problem # 1:   NONSPECIFIC ABNORM RESULTS OTH SPEC FUNCT STUDY (RSW-546.9) Assessment Unchanged we will proceed with ultrasound guided percutaneous liver biopsy to exclude fatty liver,NASH syndrome, or immune hepatitis, hemachromatosis, or other metabolic liver diseases or drug-induced hepatitis.  Problem # 2:  VITAMIN B12 DEFICIENCY (ICD-266.2) Assessment: Improved continue current treatment.  Problem # 3:  GERD (ICD-530.81) Assessment: Improved continue reflex regime and daily AcipHex therapy.  Other Orders: GI Misc Procedure/ Radiology Order (GI Misc )  Patient Instructions: 1)  Copy sent to : Kathlene November, MD 2)  Please continue current medications.  3)  Please call back with a good date for your liver biopsy. Ask to speak to [facility].  4)  The medication list was reviewed and reconciled.  All changed / newly prescribed medications were explained.  A complete medication list was provided to the patient / caregiver. 5)  Liver Biopsy handout given.   Appended Document: Orders Update faxed order to Radiology and they will contact the patient with a date and time.    Clinical  Lists Changes  Orders: Added new Referral order of CT/ULS Guided Liver Biospy (CT/ULS Guided Liv BX) - Signed

## 2010-08-09 NOTE — Letter (Signed)
Summary: Primary Care Appointment Letter  [facility]   [place], [place] [address]   Phone: [phone]  Fax: [phone]    10/14/2009 MRN: [id number]  [address] [place], [place]  [address]  Dear Ms. Annamarie Dawley,   [place] Paz MD has indicated that:    ___X____it is time to schedule an appointment. DUE FOR A PHYSICAL    _______you missed your appointment on______ and need to call and          reschedule.    _______you need to have lab work done.    _______you need to schedule an appointment discuss lab or test results.    _______you need to call to reschedule your appointment that is                       scheduled on _________.     Please call our office as soon as possible. Our phone number is [phone]________. Please press option 1. Our office is open 8a-12noon and 1p-5p, Monday through Friday.     Thank you,    Iva

## 2010-08-11 NOTE — Progress Notes (Signed)
Summary: Labs and Office Visit   ---- Converted from flag ---- ---- 07/08/2010 10:21 AM, Bernita Buffy CMA (AAMA) wrote: pt called back and she will come to get her labs done and then she will call back to make office visit since at this time she is sick.  ---- 07/08/2010 8:25 AM, Bernita Buffy CMA (AAMA) wrote: Left a message on patients machine to call back.   ---- 07/01/2010 12:06 PM, Bernita Buffy CMA (AAMA) wrote: Left a message on patients machine to call back.   ---- 07/01/2010 12:05 PM, Bernita Buffy CMA (AAMA) wrote: Left a message on patients machine to call back. check on labs  ---- 03/31/2010 9:00 AM, Bernita Buffy CMA (AAMA) wrote: needs lfts ------------------------------

## 2010-08-11 NOTE — Letter (Signed)
Summary: Office Visit Letter  [facility]  [address]   [place], [place] [address]   Phone: [phone]  Fax: [phone]      July 05, 2010 MRN: [id number]   [address] [place], [place]  [address]   Dear [name],   According to our records, it is time for you to schedule a follow-up office visit with [place].   At your convenience, please call [phone] (option #2)to schedule an office visit. If you have any questions, concerns, or feel that this letter is in error, we would appreciate your call.   Sincerely,   Verl Blalock M.D.  [facility] [phone]

## 2010-08-11 NOTE — Progress Notes (Signed)
Summary: speak to nurse   Phone Note Call from Patient Call back at Work Phone [phone]   Caller: Patient Call For: Dr Sharlett Iles Reason for Call: Talk to Nurse Summary of Call: Patient wants to speak to [facility] regarding appt. Initial call taken by: Ronalee Red,  July 12, 2010 9:04 AM  Follow-up for Phone Call        appt made for 07/29/2010 at 9:45am. pt aware Follow-up by: Bernita Buffy CMA ([place]),  July 12, 2010 9:34 AM

## 2010-08-11 NOTE — Assessment & Plan Note (Signed)
Summary: b12 inj//lch   Nurse Visit  CC: B-2 inj./kb   Allergies: 1)  ! * Advair  Medication Administration  Injection # 1:    Medication: Vit B12 1000 mcg    Diagnosis: VITAMIN B12 DEFICIENCY (ICD-266.2)    Route: IM    Site: L deltoid    Exp Date: 09/08/2011    Lot #: [id number]    Mfr: American Regent    Patient tolerated injection without complications    Given by: Ernestene Mention CMA (July 15, 2010 9:22 AM)  Orders Added: 1)  Vit B12 1000 mcg [J3420] 2)  Admin of Therapeutic Inj  intramuscular or subcutaneous [21798]

## 2010-08-12 ENCOUNTER — Encounter: Payer: Self-pay | Admitting: Internal Medicine

## 2010-08-12 ENCOUNTER — Ambulatory Visit (INDEPENDENT_AMBULATORY_CARE_PROVIDER_SITE_OTHER): Payer: Managed Care, Other (non HMO)

## 2010-08-12 DIAGNOSIS — E538 Deficiency of other specified B group vitamins: Secondary | ICD-10-CM

## 2010-08-15 NOTE — Miscellaneous (Signed)
Summary: add hep serology  ---- Converted from flag ---- ---- 01/13/2010 10:13 AM, Allyn Kenner CMA wrote: can this be added?  ---- 01/13/2010 10:08 AM, Jacqulyn Bath E. Paz MD wrote: please add (order redraw) a Chronic hepatitis panel dx. elevated LFTs ------------------------------  Pt is coming in today at 2pm.

## 2010-08-17 NOTE — Assessment & Plan Note (Signed)
Summary: NASH follow up/lk    History of Present Illness Visit Type: Follow-up Visit Primary GI MD: Verl Blalock MD [place] Primary Provider: Kathlene November, MD Requesting Provider: na Chief Complaint: Patient here to follow up after labwork for NASH. She states that she is not having any new GI symptoms at this time. 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 She is on milk thistle 200 mg a day and a variety of vitamin and mineral supplementations. She does take citalopram 20 mg a day for depression and AcipHex 20 mg a day for acid reflux, B12 shots monthly and daily fish oil.   GI Review of Systems      Denies abdominal pain, acid reflux, belching, bloating, chest pain, dysphagia with liquids, dysphagia with solids, heartburn, loss of appetite, nausea, vomiting, vomiting blood, weight loss, and  weight gain.      Reports liver problems.     Denies anal fissure, black tarry stools, change in bowel habit, constipation, diarrhea, diverticulosis, fecal incontinence, heme positive stool, hemorrhoids, irritable bowel syndrome, jaundice, light color stool, rectal bleeding, and  rectal pain. Preventive Screening-Counseling & Management  Alcohol-Tobacco     Smoking Status: quit    Current Medications (verified): 1)  Citalopram Hydrobromide 20 Mg Tabs (Citalopram Hydrobromide) .[name].. 1 By Mouth Once Daily 2)  Aciphex 20 Mg Tbec (Rabeprazole Sodium) .[name].. 1 By Mouth Qd 3)  Multivitamins  Caps (Multiple Vitamin) .[name].. 1 By Mouth Once Daily 4)  Fish Oil 1000 Mg Caps (Omega-3 Fatty Acids) .[name].. 1 Capsule By Mouth Once Daily 5)  Resveratrol 100 Mg Caps (Resveratrol) .... Take 1 Cap Once Daily 6)  Coq10 100 Mg Caps (Coenzyme Q10) .... Once Daily 7)   B Complex 100  Tabs (B Complex Vitamins) .... Once Daily 8)  Calcium-Magnesium-Vitamin D 400-166.7-133.3 Mg-Mg-Uni Tabs (Calcium-Magnesium-Vitamin D) .... Once Daily 9)  Kelp  Tabs (Iodine (Kelp)) .... Take 120m Once Daily 10)  Evening Primrose Oil 1000 Mg Caps (Evening Primrose Oil) .... Once Daily 11)  Milk Thistle 200 Mg Caps (Milk Thistle) .... Once Daily 12)  Psyllum 20071m.... Once Daily 13)  Cyanocobalamin 1000 Mcg/ml Inj Soln (Cyanocobalamin) ...[name] 1 Cc Im Weekly X 3 Then Monthly 14)  Vitamin D (Ergocalciferol) 50000 Unit Caps (Ergocalciferol) .... One Capsule By Mouth Once A Week  Allergies (verified): 1)  ! * Advair  Past History:  Past medical, surgical, family and social histories (including risk factors) reviewed for relevance to current acute and chronic problems.  Past Medical History: Reviewed history from 07/09/2008 and no changes required. Goiter Vit D deficinecy (f/u per gyn) Anxiety (panik attacks) 2005: CT of the chest and  ultrasound of the legs negative for PE or DVT.  Stress test negative 2005: history of a urethral stricture, status post dilatation 2005: E[place] Surgical History: Reviewed history from 07/09/2008 and no changes required. Appendectomy  Family History: Reviewed history from 01/11/2010 and no changes required. CAD - MGF (MI late in life) stroke - no DM - no HTN - MGF colon Ca - no breast Ca - M aunt M - deceased scleroderma F - deceased emphysema +smoker  Social History: Reviewed history from 01/11/2010 and no changes required. Single Alcohol use-yes-occ no children Occupation:carrer managment consultant  Illicit Drug Use -  no exercise-- walks occasionally diet-- needs improvment  Patient is a former smoker. -stopped at around age 35  Review of Systems       The patient complains of itching, muscle pains/cramps, and night sweats.  The patient denies allergy/sinus, anemia, anxiety-new, arthritis/joint pain, back  pain, blood in urine, breast changes/lumps, change in vision, confusion, cough, coughing up blood, depression-new, fainting, fatigue, fever, headaches-new, hearing problems, heart murmur, heart rhythm changes, menstrual pain, nosebleeds, pregnancy symptoms, shortness of breath, skin rash, sleeping problems, sore throat, swelling of feet/legs, swollen lymph glands, thirst - excessive , urination - excessive , urination changes/pain, urine leakage, vision changes, and voice change.    Vital Signs:  Patient profile:   65 year old female Height:      72 inches Weight:      201.25 pounds BMI:     27.39 BSA:     2.14 Pulse rate:   68 / minute Pulse rhythm:   regular BP sitting:   118 / 74  (left arm)  Vitals Entered By: Madlyn Frankel CMA Deborra Medina) (August 09, 2010 2:25 PM)  Physical Exam  General:  Well developed, well nourished, no acute distress.healthy appearing.  I cannot appreciate stigmata of chronic liver disease. Head:  Normocephalic and atraumatic. Eyes:  PERRLA, no icterus. Abdomen:  no hepatosplenomegaly, abdominal masses, tenderness, or ascites noted. Extremities:  No clubbing, cyanosis, edema or deformities noted. Neurologic:  Alert and  oriented x4;  grossly normal neurologically. Psych:  Alert and cooperative. Normal mood and affect.   Impression & Recommendations:  Problem # 1:  OTHER CHRONIC NONALCOHOLIC LIVER DISEASE (HER-740.8) Assessment Improved Recent Liver Function Tests Were Essentially Normal, there is no evidence of chronic liver disease on exam or hepatosplenomegaly or neurological changes. I've advised her that we should perform liver profiles every 6 months with office visits yearly. She is asked to keep her BMI under 30 and to avoid excessive acetaminophen, alcohol, and to follow a healthy balanced diet with regular aerobic exercise program.  Problem # 2:  VITAMIN B12 DEFICIENCY (ICD-266.2) Assessment: Improved  Problem # 3:  CONSTIPATION  (ICD-564.00) Assessment: Improved  Problem # 4:  GERD (ICD-530.81) Assessment: Improved Continue antireflux maneuvers and AcipHex 20 mg a day.  Problem # 5:  FATIGUE, CHRONIC (ICD-780.79) Assessment: Improved  Patient Instructions: 1)  Copy sent to : Kathlene November, MD 2)  You will need you liver test repeated in 6 months we will contact you when your due for labs.  3)  Please schedule a follow-up appointment in 1 year. 4)  The medication list was reviewed and reconciled.  All changed / newly prescribed medications were explained.  A complete medication list was provided to the patient / caregiver. 5)  Fatty Liver handout given.

## 2010-08-17 NOTE — Assessment & Plan Note (Signed)
   Nurse Visit   Allergies: 1)  ! * Advair  Medication Administration  Injection # 1:    Medication: Vit B12 1000 mcg    Diagnosis: VITAMIN B12 DEFICIENCY (ICD-266.2)    Route: IM    Site: R deltoid    Exp Date: 09/08/2011    Lot #: [id number]    Mfr: American Regent    Patient tolerated injection without complications    Given by: Rolla Flatten CMA (August 12, 2010 9:44 AM)  Orders Added: 1)  Admin of Therapeutic Inj  intramuscular or subcutaneous [[id number]] 2)  Vit B12 1000 mcg [[id number]]

## 2010-09-22 LAB — CBC
MCHC: 35 g/dL (ref 30.0–36.0)
RDW: 13.2 % (ref 11.5–15.5)

## 2010-09-22 LAB — PROTIME-INR
INR: 1.13 (ref 0.00–1.49)
Prothrombin Time: 14.7 seconds (ref 11.6–15.2)

## 2010-09-23 ENCOUNTER — Ambulatory Visit (INDEPENDENT_AMBULATORY_CARE_PROVIDER_SITE_OTHER): Payer: Managed Care, Other (non HMO) | Admitting: Internal Medicine

## 2010-09-23 ENCOUNTER — Encounter: Payer: Self-pay | Admitting: Internal Medicine

## 2010-09-23 ENCOUNTER — Other Ambulatory Visit: Payer: Self-pay | Admitting: Internal Medicine

## 2010-09-23 ENCOUNTER — Ambulatory Visit: Payer: Managed Care, Other (non HMO)

## 2010-09-23 DIAGNOSIS — R5383 Other fatigue: Secondary | ICD-10-CM

## 2010-09-23 DIAGNOSIS — E538 Deficiency of other specified B group vitamins: Secondary | ICD-10-CM

## 2010-09-23 DIAGNOSIS — R5381 Other malaise: Secondary | ICD-10-CM

## 2010-09-23 LAB — BASIC METABOLIC PANEL
BUN: 19 mg/dL (ref 6–23)
Chloride: 106 mEq/L (ref 96–112)
Glucose, Bld: 96 mg/dL (ref 70–99)
Potassium: 4.1 mEq/L (ref 3.5–5.1)

## 2010-09-23 LAB — CONVERTED CEMR LAB
Cholesterol, target level: 200 mg/dL
Vit D, 25-Hydroxy: 43 ng/mL (ref 30–89)

## 2010-09-25 ENCOUNTER — Telehealth: Payer: Self-pay | Admitting: Internal Medicine

## 2010-09-27 ENCOUNTER — Other Ambulatory Visit: Payer: Self-pay | Admitting: Internal Medicine

## 2010-09-27 DIAGNOSIS — E041 Nontoxic single thyroid nodule: Secondary | ICD-10-CM

## 2010-09-27 NOTE — Assessment & Plan Note (Signed)
Summary: extremely tired,   b12 shot  ///sph   Vital Signs:  Patient profile:   65 year old female Weight:      199 pounds Pulse rate:   84 / minute Pulse rhythm:   regular BP sitting:   116 / 70  (left arm) Cuff size:   regular  Vitals Entered By: Merkel (September 23, 2010 9:13 AM) CC: Pt here to discuss being very fatigued, Lipid Management Comments getting worse no energy fasting [facility]   History of Present Illness:  chief complaint today is fatigue, this is going on for years, gradually getting worse. Typically, she sleeps from 10 PM to 6 or 7 AM.   he takes at least 45 minutes to feel completely awake , does not feel rested in the morning  usually by mid afternoon she runs out of energy and she feels sleepy.  Denies falling asleep driving or at work  Lipid Management History:      Positive NCEP/ATP III risk factors include female age 14 years old or older.  Negative NCEP/ATP III risk factors include non-tobacco-user status.    Current Medications (verified): 1)  Citalopram Hydrobromide 20 Mg Tabs (Citalopram Hydrobromide) .[name].. 1 By Mouth Once Daily 2)  Aciphex 20 Mg Tbec (Rabeprazole Sodium) .[name].. 1 By Mouth Qd 3)  Multivitamins  Caps (Multiple Vitamin) .[name].. 1 By Mouth Once Daily 4)  Fish Oil 1000 Mg Caps (Omega-3 Fatty Acids) .[name].. 1 Capsule By Mouth Once Daily 5)  Resveratrol 100 Mg Caps (Resveratrol) .... Take 1 Cap Once Daily 6)  Coq10 100 Mg Caps (Coenzyme Q10) .... Once Daily 7)  B Complex 100  Tabs (B Complex Vitamins) .... Once Daily 8)  Calcium-Magnesium-Vitamin D 400-166.7-133.3 Mg-Mg-Uni Tabs (Calcium-Magnesium-Vitamin D) .... Once Daily 9)  Kelp  Tabs (Iodine (Kelp)) .... Take 17m Once Daily 10)  Evening Primrose Oil 1000 Mg Caps (Evening Primrose Oil) .... Once Daily 11)  Milk Thistle 200 Mg Caps (Milk Thistle) .... Once Daily 12)  Psyllum 20061m.... Once Daily 13)  Cyanocobalamin 1000 Mcg/ml Inj Soln (Cyanocobalamin) ...[name] 1 Cc Im  Weekly X 3 Then Monthly 14)  Vitamin D (Ergocalciferol) 50000 Unit Caps (Ergocalciferol) .... One Capsule By Mouth Once A Week  Allergies (verified): 1)  ! * Advair  Past History:  Past Medical History: Goiter Vit D deficinecy (f/u per gyn) Anxiety (panik attacks) 2005: CT of the chest and  ultrasound of the legs negative for PE or DVT.  Stress test negative 2005: history of a urethral stricture, status post dilatation 2005: EGD showed HH NASH, dx base on liver bx   Past Surgical History: Reviewed history from 07/09/2008 and no changes required. Appendectomy  Social History: Reviewed history from 08/09/2010 and no changes required. Single Alcohol use-yes-occ no children Occupation:carrer managment consultant  Illicit Drug Use - no exercise-- walks occasionally diet--unchanged Patient is a former smoker. -stopped at around age [age]Review of Systems General:  Denies fever and weight loss. CV:  Denies palpitations and swelling of feet; occasionally pressure in the chest, cleared after burping no DOE w/ regular activities . Resp:  Denies cough and wheezing; does not know if snores. GI:  Denies diarrhea, nausea, and vomiting. GU:  Denies abnormal vaginal bleeding. Psych:  Denies depression; ++ stress (d/t  cat sick).  Physical Exam  General:  alert, well-developed, and well-nourished.   Mouth:   throat is slightly crowded Neck:   no mass, right thyroid nodule? Lungs:  normal  respiratory effort, no intercostal retractions, no accessory muscle use, and normal breath sounds.   Heart:  normal rate, regular rhythm, no murmur, and no gallop.   Abdomen:  soft, non-tender, no distention, no masses, no guarding, and no rigidity.   Extremities:   no extremity edema Neurologic:  alert & oriented X3 and gait normal.   Psych:  Cognition and judgment appear intact. Alert and cooperative with normal attention span and concentration.     Impression & Recommendations:  Problem # 1:   FATIGUE, CHRONIC (ICD-780.79) Assessment Deteriorated   long history of fatigue, getting worse gradually  some features of sleep apnea (not rested in the morning, sleepy in the afternoon)  plan: labs apnealink  Orders: Venipuncture (51025) TLB-B12 + Folate Pnl (82746_82607-B12/FOL) T-Vitamin D (25-Hydroxy) (85277-82423) TLB-TSH (Thyroid Stimulating Hormone) (84443-TSH) TLB-Hemoglobin (Hgb) (85018-HGB) TLB-BMP (Basic Metabolic Panel-BMET) (53614-ERXVQMG) Specimen Handling (99000) Pulmonary Referral (Pulmonary)  thyroid nodule? plan: Ultrasound    Complete Medication List: 1)  Cyanocobalamin 1000 Mcg/ml Inj Soln (Cyanocobalamin) .[name].. 1 cc im weekly x 3 then monthly 2)  Citalopram Hydrobromide 20 Mg Tabs (Citalopram hydrobromide) .[name].. 1 by mouth once daily 3)  Aciphex 20 Mg Tbec (Rabeprazole sodium) .[name].. 1 by mouth qd 4)  Multivitamins Caps (Multiple vitamin) .[name].. 1 by mouth once daily 5)  Fish Oil 1000 Mg Caps (Omega-3 fatty acids) .[name].. 1 capsule by mouth once daily 6)  Resveratrol 100 Mg Caps (Resveratrol) .... Take 1 cap once daily 7)  Coq10 100 Mg Caps (Coenzyme q10) .... Once daily 8)  B Complex 100 Tabs (B complex vitamins) .... Once daily 9)  Calcium-magnesium-vitamin D 400-166.7-133.3 Mg-mg-uni Tabs (Calcium-magnesium-vitamin d) .... Once daily 10)  Kelp Tabs (Iodine (kelp)) .... Take 159m once daily 11)  Evening Primrose Oil 1000 Mg Caps (Evening primrose oil) .... Once daily 12)  Milk Thistle 200 Mg Caps (Milk thistle) .... Once daily 13)  Psyllum 20044m .... Once daily 14)  Vitamin D (ergocalciferol) 50000 Unit Caps (Ergocalciferol) .... One capsule by mouth once a week  Other Orders: Vit B12 1000 mcg (J3420) Admin of Therapeutic Inj  intramuscular or subcutaneous (9(86761 Lipid Assessment/Plan:      Based on NCEP/ATP III, the patient's risk factor category is "0-1 risk factors".  The patient's lipid goals are as follows: Total cholesterol goal is 200; LDL  cholesterol goal is 160; HDL cholesterol goal is 40; Triglyceride goal is 150.     Patient Instructions: 1)  Please schedule a follow-up appointment in 1 month.    Medication Administration  Injection # 1:    Medication: Vit B12 1000 mcg    Diagnosis: VITAMIN B12 DEFICIENCY (ICD-266.2)    Route: IM    Site: R deltoid    Exp Date: 09/08/2011    Lot #: [id number]    Mfr: American Regent    Given by: ChMalachi BondsMA (September 23, 2010 10:02 AM)  Orders Added: 1)  Venipuncture [3[95093])  TLB-B12 + Folate Pnl [82746_82607-B12/FOL] 3)  T-Vitamin D (25-Hydroxy) [8[26712-45809])  TLB-TSH (Thyroid Stimulating Hormone) [84443-TSH] 5)  TLB-Hemoglobin (Hgb) [85018-HGB] 6)  TLB-BMP (Basic Metabolic Panel-BMET) [8[98338-SNKNLZJ])  Specimen Handling [99000] 8)  Vit B12 1000 mcg [J3420] 9)  Admin of Therapeutic Inj  intramuscular or subcutaneous [96372] 10)  Est. Patient Level IV [9[67341]1)  Pulmonary Referral [Pulmonary]     Medication Administration  Injection # 1:    Medication: Vit B12 1000 mcg    Diagnosis: VITAMIN B12 DEFICIENCY (ICD-266.2)  Route: IM    Site: R deltoid    Exp Date: 09/08/2011    Lot #: [id number]    Mfr: American Regent    Given by: Malachi Bonds CMA (September 23, 2010 10:02 AM)  Orders Added: 1)  Venipuncture [84166] 2)  TLB-B12 + Folate Pnl [82746_82607-B12/FOL] 3)  T-Vitamin D (25-Hydroxy) [06301-60109] 4)  TLB-TSH (Thyroid Stimulating Hormone) [84443-TSH] 5)  TLB-Hemoglobin (Hgb) [85018-HGB] 6)  TLB-BMP (Basic Metabolic Panel-BMET) [32355-DDUKGUR] 7)  Specimen Handling [99000] 8)  Vit B12 1000 mcg [J3420] 9)  Admin of Therapeutic Inj  intramuscular or subcutaneous [96372] 10)  Est. Patient Level IV [42706] 11)  Pulmonary Referral [Pulmonary]

## 2010-09-30 ENCOUNTER — Encounter: Payer: Self-pay | Admitting: *Deleted

## 2010-09-30 ENCOUNTER — Other Ambulatory Visit: Payer: Managed Care, Other (non HMO)

## 2010-10-06 NOTE — Letter (Signed)
Summary: Unable To Reach-Consult Scheduled  [facility]   [place], [place] [address]   Phone: [phone]  Fax: [phone]    09/30/2010 MRN: [id number]    Dear Ms. Annamarie Dawley,   We have been unable to reach you by phone.  Please contact our office with an updated phone number.  If you have any question please call us.     Thank you,  Patient Care Coordinator [place] at [facility]

## 2010-10-06 NOTE — Progress Notes (Signed)
Summary: Labs (lmom 3/19,3/20,3/21)  Phone Note Outgoing Call   Summary of Call: advise patient:  all labs ok  Brinna Divelbiss E. Tausha Milhoan MD  September 25, 2010 7:06 AM   Follow-up for Phone Call        left message for pt to call back. [place]  September 26, 2010 11:45 AM   Additional Follow-up for Phone Call Additional follow up Details #1::        left message for pt to call back. Allyn Kenner CMA  September 27, 2010 10:45 AM     Additional Follow-up for Phone Call Additional follow up Details #2::    left message for pt to call back. Allyn Kenner CMA  September 28, 2010 10:46 AM   Additional Follow-up for Phone Call Additional follow up Details #3:: Details for Additional Follow-up Action Taken: printed and mailed to pt w/ letter. [place]  September 30, 2010 1:26 PM

## 2010-10-14 ENCOUNTER — Other Ambulatory Visit: Payer: Self-pay | Admitting: Gastroenterology

## 2010-10-22 ENCOUNTER — Other Ambulatory Visit: Payer: Self-pay | Admitting: Internal Medicine

## 2010-11-25 NOTE — Op Note (Signed)
NAME:  Emily Parrish, Emily Parrish                      ACCOUNT NO.:  [id number]   MEDICAL RECORD NO.:  [id number]                   PATIENT TYPE:  AMB   LOCATION:  [facility]                                 FACILITY:  [facility]   PHYSICIAN:  Marshall Cork. Jeffie Pollock, M.D.                 DATE OF BIRTH:  [birth year]   DATE OF PROCEDURE:  11/19/2003  DATE OF DISCHARGE:                                 OPERATIVE REPORT   PREOPERATIVE DIAGNOSIS:  Urethral stricture.   POSTOPERATIVE DIAGNOSIS:  Urethral stricture.   PROCEDURE:  Cystoscopy, urethral dilation.   SURGEON:  Marshall Cork. Jeffie Pollock, M.D.   ANESTHESIA:  General.   COMPLICATIONS:  None.   INDICATIONS:  [name] is a 65 year old white female who was found to  have a urethral stricture, resulting in bladder pain and spasm with  difficulty urinating.  She is to undergo cysto and urethral dilation under  anesthesia.   DESCRIPTION OF PROCEDURE:  The patient was given p.o. Cipro, she was taken  to the operating room where a general anesthetic was induced.  She was  placed in the lithotomy position and her perineum and genitalia were prepped  with Betadine solution and she was draped in the usual sterile fashion.  An  attempt was made to pass the 73 French cystoscope, this was unsuccessful due  to the stricture but I was able to take an image of the stricture.  I then  dilated the urethra from 18 to 39 [place] with female sounds.  The stricture  dilated easily.  Cystoscopy after dilation with the 70 and 12 degree lenses  demonstrated marked trabeculation of the bladder wall with a small  diverticulum, the ureteral orifices were unremarkable and no tumors or  stones were seen.  There were some glomerulations of the dome of the bladder  but those may have been from scope trauma.  The bladder was then drained,  the scope was removed, the patient was taken down from lithotomy position,  her anesthetic was reversed, and she was taken to the recovery room in  stable  condition.  There were no complications.                                               Marshall Cork. Jeffie Pollock, M.D.    JJW/MEDQ  D:  11/19/2003  T:  11/19/2003  Job:  [id number]   cc:   Colon Branch, MD [place]  [phone] [place], [place] [address]

## 2010-11-25 NOTE — Assessment & Plan Note (Signed)
[facility]                         GASTROENTEROLOGY OFFICE NOTE   Emily Parrish, Emily Parrish                   MRN:          [id number]  DATE:06/29/2006                            DOB:          April 13, 1946    Mrs.  Emily Parrish is having no globus problems or acid reflux symptoms.  She has been Aciphex daily for several years.  She wishes to go off of  this medication and we will taper her off it over the next week and see  how she does symptomatically.  I have explained a reflux regimen with  her and have let her see a patient education movie on acid reflux.  If  her symptoms return she will restart Aciphex and do calcium vitamin D  supplementation twice a day.   She is followed by Dr. Larose Kells for her primary care.  Last had colonoscopy  in October 2005 which was unremarkable.  She is on multiple other  medicines that are listed and reviewed, are mostly vitamin supplements  and also Celexa 20 mg a day.     Loralee Pacas. Sharlett Iles, MD, [facility], [facility]  Electronically Signed    DRP/MedQ  DD: 06/29/2006  DT: 06/29/2006  Job #: [id number]   cc:   Kathlene November, MD

## 2010-12-02 ENCOUNTER — Ambulatory Visit (INDEPENDENT_AMBULATORY_CARE_PROVIDER_SITE_OTHER): Payer: Managed Care, Other (non HMO) | Admitting: Pulmonary Disease

## 2010-12-02 DIAGNOSIS — G4733 Obstructive sleep apnea (adult) (pediatric): Secondary | ICD-10-CM

## 2010-12-07 DIAGNOSIS — G4733 Obstructive sleep apnea (adult) (pediatric): Secondary | ICD-10-CM | POA: Insufficient documentation

## 2010-12-07 HISTORY — DX: Obstructive sleep apnea (adult) (pediatric): G47.33

## 2010-12-07 NOTE — Assessment & Plan Note (Signed)
Study consistent with mild sleep apnea.   Recommend diet, exercise, and weight loss.  Additional therapeutic options could include CPAP therapy, oral appliance, or surgical intervention.

## 2010-12-07 NOTE — Progress Notes (Signed)
Apnealink 12/01/10:  Test time 6 hrs 25 min.  Average respiratory rate 11.  Apnea-hyponea index (AHI) 9, Respiratory disturbance index (RDI) 12.  One central apneic event; remainder of events obstructive.  Average SpO2 93%, SpO2 low 82%.  Spent 8 min with SpO2 < 89%.  Oxygen desaturation index (ODI) 10.  Average heart rate 63 (range 49 to 113).

## 2010-12-09 ENCOUNTER — Telehealth: Payer: Self-pay | Admitting: *Deleted

## 2010-12-09 NOTE — Telephone Encounter (Signed)
Message copied by Sheral Flow on Fri Dec 09, 2010  3:34 PM ------      Message from: Bernita Buffy D      Created: Wed Sep 14, 2010  9:48 AM       lfts

## 2010-12-13 ENCOUNTER — Telehealth: Payer: Self-pay | Admitting: Internal Medicine

## 2010-12-13 ENCOUNTER — Encounter: Payer: Self-pay | Admitting: Pulmonary Disease

## 2010-12-13 NOTE — Telephone Encounter (Signed)
Advise patient, recent results show mild sleep apnea. That is likely a contribution factor for her fatigue . Initially the recommendation is to weight loss if possible and also to get an oral appliance from her dentist to help with snoring. If she desires, we can refer her to a specialist or we can schedule an office visit here.

## 2010-12-14 ENCOUNTER — Encounter: Payer: Self-pay | Admitting: *Deleted

## 2010-12-14 NOTE — Telephone Encounter (Signed)
i have left several message without a return call back I will mail the patient a letter to advise she is due for labs.

## 2010-12-14 NOTE — Telephone Encounter (Signed)
Message left for patient to return my call.  

## 2010-12-15 NOTE — Telephone Encounter (Signed)
I spoke w/ pt she is aware.

## 2010-12-28 ENCOUNTER — Other Ambulatory Visit: Payer: Self-pay | Admitting: Gastroenterology

## 2010-12-28 DIAGNOSIS — K760 Fatty (change of) liver, not elsewhere classified: Secondary | ICD-10-CM

## 2010-12-28 NOTE — Progress Notes (Signed)
Pt called back and states that she will come for labs on 01/04/2011. i have put in an order.

## 2010-12-30 ENCOUNTER — Other Ambulatory Visit: Payer: Self-pay | Admitting: Internal Medicine

## 2011-01-04 ENCOUNTER — Telehealth: Payer: Self-pay | Admitting: *Deleted

## 2011-01-04 ENCOUNTER — Other Ambulatory Visit (INDEPENDENT_AMBULATORY_CARE_PROVIDER_SITE_OTHER): Payer: Managed Care, Other (non HMO)

## 2011-01-04 DIAGNOSIS — K760 Fatty (change of) liver, not elsewhere classified: Secondary | ICD-10-CM

## 2011-01-04 DIAGNOSIS — K7689 Other specified diseases of liver: Secondary | ICD-10-CM

## 2011-01-04 LAB — HEPATIC FUNCTION PANEL
AST: 27 U/L (ref 0–37)
Total Bilirubin: 0.7 mg/dL (ref 0.3–1.2)

## 2011-01-04 NOTE — Telephone Encounter (Signed)
Message copied by Sheral Flow on Wed Jan 04, 2011 12:05 PM ------      Message from: Sharlett Iles, DAVID R      Created: Wed Jan 04, 2011 10:46 AM       NORMAL RESULTS.REPEAT 1 YEAR

## 2011-01-04 NOTE — Telephone Encounter (Signed)
Staff message sent to [facility] as a reminder to have pt come back for lfts in one year. I have left a message.

## 2011-02-26 ENCOUNTER — Other Ambulatory Visit: Payer: Self-pay | Admitting: Internal Medicine

## 2011-02-27 NOTE — Telephone Encounter (Signed)
Celexa request [last refill #30x1 12/30/2010]

## 2011-02-28 NOTE — Telephone Encounter (Signed)
Ok 1 month supply and 3 RF.Also , she is due for a CPX , be sure she has it schedule, otherwise call her

## 2011-07-07 ENCOUNTER — Other Ambulatory Visit: Payer: Self-pay | Admitting: Internal Medicine

## 2011-07-07 NOTE — Telephone Encounter (Signed)
Ok RF x 3 months , has an appointment 3-13

## 2011-08-08 ENCOUNTER — Other Ambulatory Visit: Payer: Self-pay | Admitting: Gastroenterology

## 2011-09-05 ENCOUNTER — Other Ambulatory Visit: Payer: Self-pay | Admitting: Internal Medicine

## 2011-09-09 ENCOUNTER — Other Ambulatory Visit: Payer: Self-pay | Admitting: Gastroenterology

## 2011-09-18 ENCOUNTER — Encounter: Payer: Self-pay | Admitting: *Deleted

## 2011-09-19 ENCOUNTER — Ambulatory Visit (INDEPENDENT_AMBULATORY_CARE_PROVIDER_SITE_OTHER): Payer: Managed Care, Other (non HMO) | Admitting: Gastroenterology

## 2011-09-19 ENCOUNTER — Encounter: Payer: Self-pay | Admitting: Gastroenterology

## 2011-09-19 VITALS — BP 120/62 | HR 60 | Ht 72.0 in | Wt 206.0 lb

## 2011-09-19 DIAGNOSIS — K219 Gastro-esophageal reflux disease without esophagitis: Secondary | ICD-10-CM | POA: Insufficient documentation

## 2011-09-19 MED ORDER — RABEPRAZOLE SODIUM 20 MG PO TBEC: DELAYED_RELEASE_TABLET | ORAL | Status: DC

## 2011-09-19 NOTE — Progress Notes (Signed)
This is a 66 year old Caucasian female with chronic acid reflux, currently asymptomatic on AcipHex 20 mg a day. She is up-to-date on her endoscopy and colonoscopy exams. She is postmenopausal and also takes calcium with vitamin D, Celexa 20 mg a day. She denies any general medical or gastrointestinal issues at this time.  Current Medications, Allergies, Past Medical History, Past Surgical History, Family History and Social History were reviewed in [employer] record.  Pertinent Review of Systems Negative   Physical Exam: Blood pressure 120/62 and pulse 60 and regular. BMI 27.94. I cannot appreciate stigmata of chronic liver disease. Her chest is clear and she is in regular rhythm without murmurs gallops or rubs. There is no organomegaly, abdominal masses or tenderness. Bowel sounds are normal. Mental status is normal.   Assessment and Plan: A controlled acid reflux on daily PPI therapy. She gets her blood work and yearly exams with Dr.Paz yearly. I have reviewed her AcipHex and we'll see her on a when necessary basis as needed. Her colonoscopy will be scheduled as per clinical protocol. No diagnosis found.

## 2011-09-19 NOTE — Patient Instructions (Signed)
Your prescription(s) have been sent to you pharmacy.  Follow up in one year

## 2011-10-17 ENCOUNTER — Encounter: Payer: Self-pay | Admitting: Internal Medicine

## 2011-10-17 ENCOUNTER — Ambulatory Visit (INDEPENDENT_AMBULATORY_CARE_PROVIDER_SITE_OTHER): Payer: Managed Care, Other (non HMO) | Admitting: Internal Medicine

## 2011-10-17 DIAGNOSIS — F411 Generalized anxiety disorder: Secondary | ICD-10-CM

## 2011-10-17 DIAGNOSIS — K7689 Other specified diseases of liver: Secondary | ICD-10-CM

## 2011-10-17 DIAGNOSIS — E049 Nontoxic goiter, unspecified: Secondary | ICD-10-CM

## 2011-10-17 DIAGNOSIS — D72819 Decreased white blood cell count, unspecified: Secondary | ICD-10-CM

## 2011-10-17 DIAGNOSIS — Z0000 Encounter for general adult medical examination without abnormal findings: Secondary | ICD-10-CM

## 2011-10-17 DIAGNOSIS — G4733 Obstructive sleep apnea (adult) (pediatric): Secondary | ICD-10-CM

## 2011-10-17 DIAGNOSIS — E538 Deficiency of other specified B group vitamins: Secondary | ICD-10-CM

## 2011-10-17 DIAGNOSIS — N35919 Unspecified urethral stricture, male, unspecified site: Secondary | ICD-10-CM

## 2011-10-17 DIAGNOSIS — D696 Thrombocytopenia, unspecified: Secondary | ICD-10-CM

## 2011-10-17 LAB — COMPREHENSIVE METABOLIC PANEL
Albumin: 4.3 g/dL (ref 3.5–5.2)
CO2: 27 mEq/L (ref 19–32)
Calcium: 9 mg/dL (ref 8.4–10.5)
Chloride: 105 mEq/L (ref 96–112)
GFR: 63.07 mL/min (ref >60.00)
Glucose, Bld: 91 mg/dL (ref 70–99)
Sodium: 141 mEq/L (ref 135–145)
Total Bilirubin: 0.6 mg/dL (ref 0.3–1.2)
Total Protein: 7.9 g/dL (ref 6.0–8.3)

## 2011-10-17 LAB — LIPID PANEL
Cholesterol: 265 mg/dL — ABNORMAL HIGH (ref 0–200)
HDL: 49.1 mg/dL (ref >39.00)
VLDL: 28 mg/dL (ref 0.0–40.0)

## 2011-10-17 LAB — CBC WITH DIFFERENTIAL/PLATELET
Basophils Absolute: 0 10*3/uL (ref 0.0–0.1)
Lymphocytes Relative: 42.4 % (ref 12.0–46.0)
Lymphs Abs: 1.4 10*3/uL (ref 0.7–4.0)
Monocytes Relative: 9.2 % (ref 3.0–12.0)
Platelets: 144 10*3/uL — ABNORMAL LOW (ref 150.0–400.0)
RDW: 13.5 % (ref 11.5–14.6)

## 2011-10-17 LAB — LDL CHOLESTEROL, DIRECT: Direct LDL: 190.8 mg/dL

## 2011-10-17 LAB — FOLATE: Folate: 17.7 ng/mL (ref >5.9)

## 2011-10-17 NOTE — Assessment & Plan Note (Signed)
Td 08 pneumonia shot 12-09 zostavax 2009  PAPs and MMG per gyn To have a DEXA soon per pt Cscope 2005, unremarkable; was rec next in 10 years per patient  Diet-exercise discussed

## 2011-10-17 NOTE — Assessment & Plan Note (Signed)
Per GI

## 2011-10-17 NOTE — Assessment & Plan Note (Signed)
She used to see urology but has not seen them in a while. Currently asymptomatic.

## 2011-10-17 NOTE — Assessment & Plan Note (Signed)
Not taking any supplements for a while. Labs

## 2011-10-17 NOTE — Assessment & Plan Note (Signed)
Enlarged right side of the thyroid gland on exam,Rx ultrasound and TFTs

## 2011-10-17 NOTE — Assessment & Plan Note (Signed)
Well controlled 

## 2011-10-17 NOTE — Progress Notes (Signed)
  Subjective:    Patient ID: Emily Parrish, female    DOB: 01-Jul-1946, 66 y.o.   MRN: [id number]  HPI CPX  Past Medical History: Goiter Vit D deficinecy (f/u per gyn) B12 def  Anxiety (panik attacks) NASH , s/p liver Bx 2011, early portal fibrosis  Mild OSA per Apnealynk  uretheral stricture , s/p dilatation per urology ~ 2005 GERD, 2005: EGD showed [facility] 2005: CT of the chest and  ultrasound of the legs negative for PE or DVT.  Stress test negative  Past Surgical History: Appendectomy  Family History: CAD - MGF (MI late in life) stroke - no DM - no HTN - MGF colon Ca - no breast Ca - M aunt M - deceased scleroderma F - deceased emphysema +smoker  Social History: Single, no children Tobacco-- quit in the 70s Alcohol use-yes-- occ, 1 glass of wine some nights Illicit Drug Use - no Occupation:carrer managment consultant  exercise-- walks 2 miles some days  diet-- healthy, improved compared to previous years   Review of Systems  Constitutional:       Still tired, used a dental appliance w/o much help  Respiratory: Negative for cough and shortness of breath.   Cardiovascular: Negative for chest pain and leg swelling.  Gastrointestinal: Negative for abdominal pain and blood in stool.  Genitourinary: Negative for dysuria and difficulty urinating.  Psychiatric/Behavioral:       Sx well controlled on SSRIs       Objective:   Physical Exam  General:  alert, well-developed, and well-nourished.   Neck:  Enlarged R thyroid gland? Not tender    Lungs:  normal respiratory effort, no intercostal retractions, no accessory muscle use, and normal breath sounds.   Heart:  normal rate, regular rhythm, no murmur, and no gallop.   Abdomen:  soft, non-tender, normal bowel sounds, no distention, no guarding, no rigidity, and no rebound tenderness.  Extremities:  no lower extremity edema Neurologic:  alert & oriented X3, strength normal in all extremities, and gait normal.     Psych:  Cognition and judgment appear intact. Alert and cooperative with normal attention span and concentration.  not anxious appearing and not depressed appearing.      Assessment & Plan:  Td 08 pneumonia shot 12-09 zostavax 2009  PAPs and MMG per gyn To have a DEXA soon per pt Cscope 2005, unremarkable; was rec next in 10 years per patient  Diet-exercise discussed

## 2011-10-17 NOTE — Assessment & Plan Note (Signed)
Diagnosed with mild sleep apnea 11/2010, she had a dental appliance, use it for a while, did not feel any difference on her fatigue. Currently, her dentist is working on getting a new appliance. I explained the patient the increase of cardiovascular risk with sleep apnea, recommend to think about a CPAP machine, patient is reluctant to do so, will let me know if the new dental appliance doesn't work for her

## 2011-10-19 ENCOUNTER — Encounter: Payer: Self-pay | Admitting: Internal Medicine

## 2011-10-20 ENCOUNTER — Other Ambulatory Visit: Payer: Managed Care, Other (non HMO)

## 2011-10-20 NOTE — Progress Notes (Signed)
Addended by: Douglass Rivers T on: 10/20/2011 09:51 AM   Modules accepted: Orders

## 2011-10-22 ENCOUNTER — Other Ambulatory Visit: Payer: Self-pay | Admitting: Internal Medicine

## 2011-10-23 NOTE — Telephone Encounter (Signed)
Refill done.  

## 2011-10-24 ENCOUNTER — Telehealth: Payer: Self-pay | Admitting: *Deleted

## 2011-10-24 ENCOUNTER — Ambulatory Visit
Admission: RE | Admit: 2011-10-24 | Discharge: 2011-10-24 | Disposition: A | Discharge status: Home / Self Care | Payer: Managed Care, Other (non HMO) | Source: Ambulatory Visit | Attending: Internal Medicine | Admitting: Internal Medicine

## 2011-10-24 DIAGNOSIS — E049 Nontoxic goiter, unspecified: Secondary | ICD-10-CM

## 2011-10-24 NOTE — Telephone Encounter (Signed)
Pt is requesting to have high sensitve c-reaction protein(HSCRP) lab drawn. Pt notes that this lab is needed in order to complete the [employer] risk score. .Please advise on lab orders and dx codes.

## 2011-10-24 NOTE — Telephone Encounter (Signed)
I entered the test. I don't frequently prescribed this test, I don't know her insurance coverage. She just need  to be aware that it may not be covered by her insurance.

## 2011-10-24 NOTE — Telephone Encounter (Signed)
Discuss with patient, labs order future, appt scheduled.

## 2011-10-30 ENCOUNTER — Telehealth: Payer: Self-pay | Admitting: *Deleted

## 2011-10-30 ENCOUNTER — Other Ambulatory Visit: Payer: Managed Care, Other (non HMO)

## 2011-10-30 ENCOUNTER — Telehealth: Payer: Self-pay | Admitting: Hematology & Oncology

## 2011-10-30 DIAGNOSIS — G4733 Obstructive sleep apnea (adult) (pediatric): Secondary | ICD-10-CM

## 2011-10-30 NOTE — Telephone Encounter (Signed)
Pt called back stating that she would like to proceed with the CPAP machine as previously discuss at [place].

## 2011-10-30 NOTE — Telephone Encounter (Signed)
Left message to call office

## 2011-10-30 NOTE — Telephone Encounter (Signed)
Called patient to schedule appointment. She could only come on certain days. She is aware of 11-24-11 appointment

## 2011-10-30 NOTE — Telephone Encounter (Signed)
Advise pt, I just entered a pulmonary referral

## 2011-10-31 ENCOUNTER — Encounter: Payer: Self-pay | Admitting: Pulmonary Disease

## 2011-10-31 ENCOUNTER — Ambulatory Visit (INDEPENDENT_AMBULATORY_CARE_PROVIDER_SITE_OTHER): Payer: Managed Care, Other (non HMO) | Admitting: Pulmonary Disease

## 2011-10-31 VITALS — BP 114/76 | HR 68 | Temp 98.3°F | Ht 71.5 in | Wt 205.8 lb

## 2011-10-31 DIAGNOSIS — G4733 Obstructive sleep apnea (adult) (pediatric): Secondary | ICD-10-CM

## 2011-10-31 NOTE — Progress Notes (Signed)
Chief Complaint  Patient presents with  . Follow-up    Pt is here bc she is wanting to start cpap. pt states she sleeps about 8-9 hrs a day and still wakes up tired    History of Present Illness: Emily Parrish is a 66 y.o. female for evaluation of OSA.  She had home sleep study Dec 01, 2010.  This showed mild sleep apnea with AHI 9, and RDI 12.  She had SpO2 low of 89%.  She was tried on oral appliance, but was not able to tolerate this.  She continues to have trouble with her sleep, and daytime sleepiness.  As a result sleep consultation was requested to assess additional therapies for sleep apnea.  She has noticed trouble feeling sleep during the day for years.  This seems to be getting worse.  She lives alone.  She has been told she snores, but is not sure if she stops breathing while asleep.  She does have more trouble sleeping on her back, and her mouth will get dry when asleep.  She goes to bed at 10 pm.  She usually falls asleep quickly.  She does not use anything to help sleep at night.  She sleeps through the night, and wakes up at 7 am.  She feels tired in the mornings, but denies headaches.  She will occasionally use an energy drink to help keep her going.  She drinks green tea several times per day.  She does not nap because she is generally too busy.  She will fall asleep when she gets a chance to sit down.  She does get occasional leg cramps, but this gets better after she drinks some pickle juice.  The patient denies sleep walking, sleep talking, bruxism, or nightmares.  There is no history of restless legs.  The patient denies sleep hallucinations, sleep paralysis, or cataplexy.  Her Epworth score is 7 out of 24.    Past Medical History  Diagnosis Date  . Vitamin d deficiency   . Goiter   . Anxiety   . Esophageal reflux   . Hiatal hernia   . Fatty liver   . OSA (obstructive sleep apnea) 12/07/2010    Apnealink 12/01/10>>AHI 9, RDI 10, SpO2 low 82%.     Past  Surgical History  Procedure Date  . Appendectomy     Allergies  Allergen Reactions  . Advair Hfa     REACTION: rapid heart rate   History   Social History  . Marital Status: Divorced   Occupational History  . Retired     Social History Main Topics  . Smoking status: Former Smoker -- 10 years    Types: Cigarettes    Quit date: 07/11/1971  . Smokeless tobacco: Never Used   Comment: 1 1/2 pack per week  . Alcohol Use: Yes     Glass a day   . Drug Use: No    Family History  Problem Relation Age of Onset  . Coronary artery disease Maternal Grandfather   . Breast cancer Maternal Aunt   . Scleroderma Mother   . Emphysema Father   . Colon cancer Neg Hx    ROS: 12 point negative except above.  Physical Exam:  Blood pressure 114/76, pulse 68, temperature 98.3 F (36.8 C), temperature source Oral, height 5' 11.5" (1.816 m), weight 205 lb 12.8 oz (93.35 kg), SpO2 96.00%. Body mass index is 28.30 kg/(m^2). Wt Readings from Last 2 Encounters:  10/31/11 205 lb 12.8 oz (93.35  kg)  10/17/11 202 lb (91.627 kg)    General - No distress HEENT - PERRLA, EOMI, no sinus tenderness, narrow nasal angles, MP 2, +2 tonsils, no oral exudate, no LAN Cardiac - s1s2 regular, no murmur Chest - good air entry, no wheeze/rales Abdomen - soft, nontender Extremities - no edema Skin - no rashes Neurologic - normal strength Psychiatric - normal mood, behavior  CBC    Component Value Date/Time   WBC 3.3* 10/17/2011 1100   RBC 4.24 10/17/2011 1100   HGB 13.4 10/17/2011 1100   HCT 40.0 10/17/2011 1100   PLT 144.0* 10/17/2011 1100   MCV 94.4 10/17/2011 1100   MCH 32.3 03/25/2010 0826   MCHC 33.5 10/17/2011 1100   RDW 13.5 10/17/2011 1100   LYMPHSABS 1.4 10/17/2011 1100   MONOABS 0.3 10/17/2011 1100   EOSABS 0.1 10/17/2011 1100   BASOSABS 0.0 10/17/2011 1100    BMET    Component Value Date/Time   NA 141 10/17/2011 1100   K 4.3 10/17/2011 1100   CL 105 10/17/2011 1100   CO2 27 10/17/2011 1100   GLUCOSE 91  10/17/2011 1100   GLUCOSE 101* 04/30/2006 1630   BUN 22 10/17/2011 1100   CREATININE 0.9 10/17/2011 1100   CALCIUM 9.0 10/17/2011 1100   GFRNONAA 55.20 01/11/2010 0000   GFRAA 93 07/09/2008 0950    Lab Results  Component Value Date   ALT 55* 10/17/2011   AST 44* 10/17/2011   ALKPHOS 54 10/17/2011   BILITOT 0.6 10/17/2011   Lab Results  Component Value Date   TSH 5.45 10/17/2011     Assessment/Plan:  Outpatient Encounter Prescriptions as of 10/31/2011  Medication Sig Dispense Refill  . citalopram (CELEXA) 20 MG tablet TAKE 1 TABLET BY MOUTH EVERY DAY (MUST KEEP APRIL APPT FOR FURTHER REFILLS)  30 tablet  0  . RABEprazole (ACIPHEX) 20 MG tablet Take one tablet by mouth once a day  30 tablet  11    Jaivyn Gulla Pager:  [phone] 10/31/2011, 4:03 PM

## 2011-10-31 NOTE — Patient Instructions (Signed)
Will arrange for CPAP set up at home Follow up in 2 months

## 2011-10-31 NOTE — Assessment & Plan Note (Signed)
She has snoring, sleep disruption, and daytime sleepiness.  She has mild sleep apnea based on home sleep study from May 2012.  I have reviewed her sleep test results with the patient.  Explained how sleep apnea can affect the patient's health.  Driving precautions and importance of weight loss were discussed.  Treatment options for sleep apnea were reviewed.   She was not able to tolerate oral appliance.  She is willing to try CPAP.  Will arrange for auto CPAP at home.  If this is unsuccessful, then she will need in lab titration.

## 2011-10-31 NOTE — Telephone Encounter (Signed)
Discuss with patient  

## 2011-11-09 ENCOUNTER — Encounter: Payer: Self-pay | Admitting: *Deleted

## 2011-11-24 ENCOUNTER — Ambulatory Visit (HOSPITAL_BASED_OUTPATIENT_CLINIC_OR_DEPARTMENT_OTHER): Payer: Managed Care, Other (non HMO) | Admitting: Hematology & Oncology

## 2011-11-24 ENCOUNTER — Other Ambulatory Visit (HOSPITAL_BASED_OUTPATIENT_CLINIC_OR_DEPARTMENT_OTHER): Payer: Managed Care, Other (non HMO) | Admitting: Lab

## 2011-11-24 ENCOUNTER — Ambulatory Visit: Payer: Managed Care, Other (non HMO)

## 2011-11-24 VITALS — BP 122/79 | HR 59 | Temp 97.2°F | Ht 71.0 in | Wt 205.0 lb

## 2011-11-24 DIAGNOSIS — D696 Thrombocytopenia, unspecified: Secondary | ICD-10-CM

## 2011-11-24 DIAGNOSIS — D72819 Decreased white blood cell count, unspecified: Secondary | ICD-10-CM

## 2011-11-24 DIAGNOSIS — K7689 Other specified diseases of liver: Secondary | ICD-10-CM

## 2011-11-24 DIAGNOSIS — K7581 Nonalcoholic steatohepatitis (NASH): Secondary | ICD-10-CM

## 2011-11-24 LAB — CBC WITH DIFFERENTIAL (CANCER CENTER ONLY)
BASO%: 0.6 % (ref 0.0–2.0)
LYMPH%: 44.6 % (ref 14.0–48.0)
MCH: 32.2 pg (ref 26.0–34.0)
MCHC: 34.6 g/dL (ref 32.0–36.0)
MCV: 93 fL (ref 81–101)
MONO%: 10.5 % (ref 0.0–13.0)
NEUT%: 41.9 % (ref 39.6–80.0)
Platelets: 127 10*3/uL — ABNORMAL LOW (ref 145–400)
RDW: 12.8 % (ref 11.1–15.7)

## 2011-11-24 LAB — CHCC SATELLITE - SMEAR

## 2011-11-24 NOTE — Progress Notes (Signed)
This office note has been dictated.

## 2011-11-24 NOTE — Progress Notes (Signed)
CC:   Kathlene November, MD Loralee Pacas. Sharlett Iles, MD, Marval Regal, FACP, FAGA  DIAGNOSES: 1. Leukopenia/thrombocytopenia. 2. Nonalcoholic steatohepatitis.  HISTORY OF PRESENT ILLNESS:  Ms. Schlup is a really nice 66 year old white female.  She is originally from [place].  She is followed by Dr. Kathlene November.  She works as a Arts development officer.  She has been noted to have some leukopenia and thrombocytopenia.  This is mild.  She has been asymptomatic.  She has never had any issues with bleeding.  There has been no infection issues.  She has had no increase in bruising.  Going back through her chart, in 2008, her white cell count was 3.3, hemoglobin 12.8, hematocrit 36.7, platelet count 148.  Back in 2011, her white cell count was 2.9 with a platelet count of 134.  Most recently, CBC was done which showed a white cell count of 3.3, hemoglobin 13.4, hematocrit of 40 and platelet count 144.  MCV is 94. She has a normal white cell differential.  She has had other studies done.  She had a TSH which was 5.45.  Vitamin B12 was normal at 279.  She had a normal folate.  She had normal electrolytes with the exception of mildly elevated LFTs.  I suspect that because of the LFT elevation, she underwent liver evaluation.  According to the medical record that was sent over, she was diagnosed with NASH back in 2011.  She had a liver biopsy which showed "early portal fibrosis."  She says she does not think she has had any kind of a CT of the abdomen or any type of ultrasound of the abdomen.  She did have an abdominal ultrasound done back in 2011 which did show increased echogenicity throughout the liver compatible with fatty infiltration.  Her spleen appeared to be of normal size.  There is no lymphadenopathy.  Again, she has had no problems with infections.  There has ben no weight loss or weight gain.  She feels that she is not exercising enough. Hopefully, she will do more exercising during the  summer.  She has had no fevers, sweats or chills.  There are no joint issues. She has had no change in bowel or bladder habits.  She gets her mammograms yearly.  She does not require colonoscopy for another few years.  She was kindly referred to the [facility] for an evaluation of her leukopenia and thrombocytopenia.  PAST MEDICAL HISTORY:  Remarkable for: 1. GERD. 2. NASH. 3. Obstructive sleep apnea. 4. Anxiety. 5. "Vitamin B12 deficiency." 6. Goiter.  ALLERGIES:  Serevent Diskus.  MEDICATIONS: 1. Celexa 20 mg p.o. daily. 2. __________ 20 mg p.o. daily. 3. __________ 250 mg p.o. daily.  SOCIAL HISTORY:  Remarkable for remote tobacco use.  She has social alcohol use.  There are no occupational exposures.  There is no history of exposure or risk factors for HIV or hepatitis.  She has never had a blood transfusion.  FAMILY HISTORY:  Remarkable for an aunt who had breast cancer but did not die of this.  History of coronary artery disease.  Her mother passed from scleroderma.  Her father passed away from emphysema.  REVIEW OF SYSTEMS:  As stated in the history of present illness.  There are no additional findings are noted on 12-system review.  PHYSICAL EXAM:  General:  This is a well-developed, well-nourished white female in no obvious distress.  Vital signs:  Show a temperature of 97.6, pulse 59, respiratory rate 20, blood pressure 122/79.  Weight is 205.  Head and neck:  Shows a normocephalic, atraumatic skull.  There are no ocular or oral lesions.  There is no scleral icterus.  There is no adenopathy in the neck.  Thyroid is not palpable.  There is no supraclavicular lymph nodes.  Lungs:  Clear to percussion and auscultation bilaterally.  Cardiac:  Regular rate and rhythm with a normal S1 and S2.  There are no murmurs, rubs or bruits.  Abdomen:  Soft with good bowel sounds.  There is no palpable abdominal mass.  There is no fluid wave.  There is no  palpable hepatosplenomegaly.  Back:  No tenderness over the spine, ribs, or hips.  Extremities:  No clubbing, no cyanosis or edema.  Neurological:  No focal neurological deficits. Skin:  No rashes, ecchymosis or petechia.  LABORATORY STUDIES:  White cell count is 3.3, hemoglobin 12.4, hematocrit 35.8, platelet count 127.  MCV is 93.  Peripheral smear shows a normochromic, normocytic population of red blood cells.  There are no nucleated red blood cells.  I see no teardrop cells.  There is no rouleaux formation.  I see no schistocytes or spherocytes.  There are no target cells.  White cells are slightly decreased in number.  I see no immature myeloid cells.  There are no hypersegmented polys.  There might be slight increase in lymphocytes.  There are no atypical lymphocytes. There are immature myeloid cells.  There are no blasts.  Platelets are minimally decreased in number.  She has several large platelets. Platelets are well granulated.  IMPRESSION:  Ms. Felix is a 66 year old white female with chronic leukopenia,  thrombocytopenia.  This has been going back for 5 years. This has been stable.  I really have to believe that this is somehow related to this NASH that she has.  I do not feel her spleen, but she may have some functional hypersplenism that might be causing some sequestration of her white cells and platelets.  I cannot find anything on her physical exam that would suggest underlying hematologic malignancy.  Her blood smear was consistent with a consumption-type process.  I do not see a need for a bone marrow biopsy on Ms. Balicki.  I just think this would be incredibly low yield.  Again, I have to believe that this NASH that she has is the most likely source for the thrombocytopenia and leukopenia.  I see no medications that she is on.  I do not see any evidence of B12 deficiency.  I spent a good hour with Ms. Gest.  She is very, very nice.  It was nice  talking to her.  She does have an interesting job.  I do not see that any special precautions need to be taken with Ms. July.  I just do not feel that she is at risk for bleeding or infection given her current level of white cells and platelets.  Again, as nice as Ms. Yusuf is, I just do not think that we need to see her back in the office.  She is being followed closely by Dr. Larose Kells. I think that as long as Dr. Larose Kells takes her CBC once or twice a year, this should be appropriate for her.  If for some reason there is a change in her blood counts, we will be more than happy to get Ms. Cadle back.    ______________________________ Volanda Napoleon, M.D. PRE/MEDQ  D:  11/24/2011  T:  11/24/2011  Job:  [id number]

## 2011-11-27 ENCOUNTER — Other Ambulatory Visit: Payer: Self-pay | Admitting: Internal Medicine

## 2011-11-27 NOTE — Telephone Encounter (Signed)
Refill done.  

## 2011-12-26 ENCOUNTER — Telehealth: Payer: Self-pay | Admitting: *Deleted

## 2011-12-26 ENCOUNTER — Ambulatory Visit: Payer: Managed Care, Other (non HMO) | Admitting: Pulmonary Disease

## 2011-12-26 NOTE — Telephone Encounter (Signed)
Message copied by Lance Morin on Tue Dec 26, 2011  3:44 PM ------      Message from: Bernita Buffy D      Created: Fri Dec 22, 2011  2:49 PM                   ----- Message -----         From: Sheral Flow, CMA         Sent: 12/25/2011           To: Sheral Flow, CMA                        ----- Message -----         From: Sheral Flow, CMA         Sent: 12/11/2011           To: Bernita Buffy, CMA            Needs repeat lfts see labs 01/03/2011

## 2011-12-26 NOTE — Telephone Encounter (Signed)
Informed pt she needs repeat lft's on 01/03/12. Pt stated understanding and will come in on 01/02/12 probably.

## 2012-01-02 ENCOUNTER — Other Ambulatory Visit (INDEPENDENT_AMBULATORY_CARE_PROVIDER_SITE_OTHER): Payer: Managed Care, Other (non HMO)

## 2012-01-02 DIAGNOSIS — K7689 Other specified diseases of liver: Secondary | ICD-10-CM

## 2012-01-02 LAB — HEPATIC FUNCTION PANEL
ALT: 45 U/L — ABNORMAL HIGH (ref 0–35)
Albumin: 3.8 g/dL (ref 3.5–5.2)
Total Protein: 7.4 g/dL (ref 6.0–8.3)

## 2012-01-08 ENCOUNTER — Other Ambulatory Visit: Payer: Self-pay

## 2012-01-08 DIAGNOSIS — K760 Fatty (change of) liver, not elsewhere classified: Secondary | ICD-10-CM

## 2012-01-17 ENCOUNTER — Ambulatory Visit (INDEPENDENT_AMBULATORY_CARE_PROVIDER_SITE_OTHER): Payer: Managed Care, Other (non HMO) | Admitting: Pulmonary Disease

## 2012-01-17 ENCOUNTER — Encounter: Payer: Self-pay | Admitting: Pulmonary Disease

## 2012-01-17 VITALS — BP 100/74 | HR 72 | Temp 98.1°F | Ht 71.75 in | Wt 207.4 lb

## 2012-01-17 DIAGNOSIS — G4733 Obstructive sleep apnea (adult) (pediatric): Secondary | ICD-10-CM

## 2012-01-17 NOTE — Assessment & Plan Note (Signed)
She reports compliance with CPAP.  She has improvement in sleep quality and energy from using CPAP, but not as much as she was hoping.  Will get CPAP report and call her with results.  Depending on results will determine if she needs in lab study.

## 2012-01-17 NOTE — Progress Notes (Signed)
Chief Complaint  Patient presents with  . 2 month Follow-up    Pt reports sleeping is "a little better", wears machine approx 8 hrs/night, denies any problems with pressure setting, not as tired in the morning but still doesnt have much energy as she would like     History of Present Illness: Emily Parrish is a 66 y.o. female with OSA.  She has been using CPAP every night.  She has full face mask, and no problem with mask.  She goes to bed at 10 pm.  She wakes up at 6 am and takes her mask off, but does not get out of bed until 7 am.  She uses the bathroom once at night.  She is not taking naps during the day.  She is sleeping better, but was hoping to have more energy during the day.   Past Medical History  Diagnosis Date  . Vitamin d deficiency   . Goiter   . Anxiety   . Esophageal reflux   . Hiatal hernia   . Fatty liver   . OSA (obstructive sleep apnea) 12/07/2010    Apnealink 12/01/10>>AHI 9, RDI 10, SpO2 low 82%.     Past Surgical History  Procedure Date  . Appendectomy     Outpatient Encounter Prescriptions as of 01/17/2012  Medication Sig Dispense Refill  . Calcium Carbonate-Vitamin D (CALCIUM + D PO) Take by mouth every morning.      . citalopram (CELEXA) 20 MG tablet TAKE 1 TABLET BY MOUTH EVERY DAY (MUST KEEP APRIL APPT FOR FURTHER REFILLS)  30 tablet  0  . Docosahexaenoic Acid-EPA (ATABEX DHA) 250-100 MG CAPS Take by mouth every morning.      . Mag Aspart-Potassium Aspart (POTASSIUM & MAGNESIUM ASPARTAT PO) Take by mouth every morning.      . Multiple Vitamins-Minerals (MULTIVITAMIN PO) Take by mouth.      . RABEprazole (ACIPHEX) 20 MG tablet Take one tablet by mouth once a day  30 tablet  11  . Resveratrol 250 MG CAPS Take by mouth every morning.      . Vitamin D, Ergocalciferol, (DRISDOL) 50000 UNITS CAPS Take 50,000 Units by mouth every 7 (seven) days.      [name] DISCONTD: citalopram (CELEXA) 20 MG tablet TAKE 1 TABLET BY MOUTH ONCE DAILY  30 tablet  3     Allergies  Allergen Reactions  . Fluticasone-Salmeterol     REACTION: rapid heart rate, medication is inhalent    Physical Exam:  Blood pressure 100/74, pulse 72, temperature 98.1 F (36.7 C), temperature source Oral, height 5' 11.75" (1.822 m), weight 207 lb 6.4 oz (94.076 kg), SpO2 97.00%. Body mass index is 28.32 kg/(m^2). Wt Readings from Last 2 Encounters:  01/17/12 207 lb 6.4 oz (94.076 kg)  11/24/11 205 lb (92.987 kg)    HEENT - PERRLA, EOMI, no sinus tenderness, narrow nasal angles, MP 2, +2 tonsils, no oral exudate, no LAN  Cardiac - s1s2 regular, no murmur  Chest - good air entry, no wheeze/rales  Abdomen - soft, nontender  Extremities - no edema  Skin - no rashes  Neurologic - normal strength  Psychiatric - normal mood, behavior    Assessment/Plan:  Chesley Mires, MD [facility] Pager:  [phone] 01/17/2012, 4:57 PM

## 2012-01-17 NOTE — Patient Instructions (Signed)
Will call with results of CPAP report Follow up in 4 months

## 2012-02-21 ENCOUNTER — Telehealth: Payer: Self-pay | Admitting: Internal Medicine

## 2012-02-21 DIAGNOSIS — E041 Nontoxic single thyroid nodule: Secondary | ICD-10-CM

## 2012-02-21 NOTE — Telephone Encounter (Addendum)
There is currently, nor previously been a referral entered for Endocrinology.  The patient's last office visit with Dr. Larose Kells was 10/17/11, and I see nothing in the office notes that states anything about patient being referred to Endo.  Please advise.

## 2012-02-21 NOTE — Telephone Encounter (Signed)
Caller: Khari/Patient; Patient Name: Emily Parrish; PCP: Kathlene November; [place] Phone Number: [phone].  Patient calling in follow up regarding referral to Dr. Ellison/endocrinologist to evaluate a thyroid nodule discovered in April 2013.  States she is ready to proceed with the referral.  States she is able to be seen at any time, except for the first week in September 2013, when she will be out of town.   Info to office for staff/provider review/referral/callback. krs/can MAY REACH PATIENT AT [phone] work.

## 2012-02-29 NOTE — Telephone Encounter (Signed)
Per u/s done ~ 11-2011, thyroid nodule enlarged, referral entered

## 2012-03-19 ENCOUNTER — Ambulatory Visit: Payer: Managed Care, Other (non HMO) | Admitting: Endocrinology

## 2012-03-22 ENCOUNTER — Encounter: Payer: Self-pay | Admitting: Gastroenterology

## 2012-03-26 ENCOUNTER — Other Ambulatory Visit: Payer: Self-pay | Admitting: Internal Medicine

## 2012-03-26 ENCOUNTER — Encounter: Payer: Self-pay | Admitting: Endocrinology

## 2012-03-26 ENCOUNTER — Ambulatory Visit (INDEPENDENT_AMBULATORY_CARE_PROVIDER_SITE_OTHER): Payer: Managed Care, Other (non HMO) | Admitting: Endocrinology

## 2012-03-26 ENCOUNTER — Other Ambulatory Visit (HOSPITAL_COMMUNITY)
Admission: RE | Admit: 2012-03-26 | Discharge: 2012-03-26 | Disposition: A | Discharge status: Home / Self Care | Payer: Managed Care, Other (non HMO) | Source: Ambulatory Visit | Attending: Endocrinology | Admitting: Endocrinology

## 2012-03-26 VITALS — BP 112/82 | HR 70 | Temp 97.6°F | Ht 72.0 in | Wt 209.0 lb

## 2012-03-26 DIAGNOSIS — E041 Nontoxic single thyroid nodule: Secondary | ICD-10-CM

## 2012-03-26 DIAGNOSIS — E042 Nontoxic multinodular goiter: Secondary | ICD-10-CM

## 2012-03-26 NOTE — Telephone Encounter (Signed)
Refill done.  

## 2012-03-26 NOTE — Patient Instructions (Addendum)
You will be contacted with your biopsy results. If no cancer is found, please return in 1 year.

## 2012-03-26 NOTE — Progress Notes (Signed)
Subjective:    Patient ID: Emily Parrish, female    DOB: 05/04/1946, 66 y.o.   MRN: [id number]  HPI Pt was noted in approx 2005 to have a slight thyroid nodule.  She has no sensation of swelling at the anterior neck, or assoc pain.  She has never had a bx.   Past Medical History  Diagnosis Date  . Vitamin d deficiency   . Goiter   . Anxiety   . Esophageal reflux   . Hiatal hernia   . Fatty liver   . OSA (obstructive sleep apnea) 12/07/2010    Apnealink 12/01/10>>AHI 9, RDI 10, SpO2 low 82%.     Past Surgical History  Procedure Date  . Appendectomy     History   Social History  . Marital Status: Divorced    Spouse Name: N/A    Number of Children: N/A  . Years of Education: N/A   Occupational History  . Retired     Social History Main Topics  . Smoking status: Former Smoker -- 10 years    Types: Cigarettes    Quit date: 07/11/1971  . Smokeless tobacco: Never Used   Comment: 1 1/2 pack per week  . Alcohol Use: Yes     Glass a day   . Drug Use: No  . Sexually Active: Not on file   Other Topics Concern  . Not on file   Social History Narrative  . No narrative on file    Current Outpatient Prescriptions on File Prior to Visit  Medication Sig Dispense Refill  . citalopram (CELEXA) 20 MG tablet TAKE 1 TABLET BY MOUTH EVERY DAY (MUST KEEP APRIL APPT FOR FURTHER REFILLS)  30 tablet  0  . RABEprazole (ACIPHEX) 20 MG tablet Take one tablet by mouth once a day  30 tablet  11  . Vitamin D, Ergocalciferol, (DRISDOL) 50000 UNITS CAPS Take 50,000 Units by mouth every 7 (seven) days.      . Calcium Carbonate-Vitamin D (CALCIUM + D PO) Take by mouth every morning.      . Docosahexaenoic Acid-EPA (ATABEX DHA) 250-100 MG CAPS Take by mouth every morning.      . Mag Aspart-Potassium Aspart (POTASSIUM & MAGNESIUM ASPARTAT PO) Take by mouth every morning.      . Multiple Vitamins-Minerals (MULTIVITAMIN PO) Take by mouth.      . Resveratrol 250 MG CAPS Take by mouth every  morning.        Allergies  Allergen Reactions  . Fluticasone-Salmeterol     REACTION: rapid heart rate, medication is inhalent    Family History  Problem Relation Age of Onset  . Coronary artery disease Maternal Grandfather   . Breast cancer Maternal Aunt   . Scleroderma Mother   . Emphysema Father   . Colon cancer Neg Hx   mother had uncertain type of thyroid problem  BP 112/82  Pulse 70  Temp 97.6 F (36.4 C) (Oral)  Ht 6' (1.829 m)  Wt 209 lb (94.802 kg)  BMI 28.35 kg/m2  SpO2 95%  Review of Systems denies depression, hair loss, sob, weight gain, constipation, numbness, blurry vision, myalgias, dry skin, rhinorrhea, sob, dysphagia, and syncope.  She has muscle cramps, easy bruising, and difficulty with concentration.    Objective:   Physical Exam VS: see vs page GEN: no distress HEAD: head: no deformity eyes: no periorbital swelling, no proptosis external nose and ears are normal mouth: no lesion seen NECK: i can palpate the 2 cm left  thyroid nodule CHEST WALL: no deformity LUNGS:  Clear to auscultation CV: reg rate and rhythm, no murmur MUSCULOSKELETAL: muscle bulk and strength are grossly normal.  no obvious joint swelling.  gait is normal and steady EXTEMITIES: no deformity of the hands. no edema PULSES: dorsalis pedis intact bilat.   NEURO:  cn 2-12 grossly intact.   readily moves all 4's.  sensation is intact to touch on all 4's SKIN:  Normal texture and temperature.  No rash or suspicious lesion is visible.   NODES:  None palpable at the neck PSYCH: alert, oriented x3.  Does not appear anxious nor depressed.  Lab Results  Component Value Date   TSH 5.45 10/17/2011  (i reviewed [place] report)   thyroid needle bx: consent obtained, signed form on chart The area is first sprayed with cooling agent local: xylocaine 2%, with epinephrine prep: alcohol pad 4 bxs are done with 25 and 47V needles no complications  (i reviewed bx cytol result)    Assessment  & Plan:  Small multinodular goiter, due to chronic thyroiditis.  She is at risk for hypothyroidism Muscle cramps, not thyroid-related Difficulty with concentration, not thyroid-related

## 2012-03-28 ENCOUNTER — Encounter: Payer: Self-pay | Admitting: Endocrinology

## 2012-03-31 DIAGNOSIS — E042 Nontoxic multinodular goiter: Secondary | ICD-10-CM | POA: Insufficient documentation

## 2012-04-03 ENCOUNTER — Telehealth: Payer: Self-pay | Admitting: *Deleted

## 2012-04-03 NOTE — Telephone Encounter (Signed)
Called pt to inform of results of thyroid biopsy. Left message for pt to callback office.

## 2012-04-05 NOTE — Telephone Encounter (Signed)
Pt informed of results.

## 2012-04-08 ENCOUNTER — Other Ambulatory Visit: Payer: Self-pay | Admitting: Internal Medicine

## 2012-04-09 NOTE — Telephone Encounter (Signed)
Rx sent.    MW 

## 2012-04-30 ENCOUNTER — Ambulatory Visit (INDEPENDENT_AMBULATORY_CARE_PROVIDER_SITE_OTHER): Payer: Managed Care, Other (non HMO) | Admitting: Internal Medicine

## 2012-04-30 VITALS — BP 118/82 | HR 64 | Temp 97.9°F | Wt 215.0 lb

## 2012-04-30 DIAGNOSIS — M1990 Unspecified osteoarthritis, unspecified site: Secondary | ICD-10-CM

## 2012-04-30 NOTE — Assessment & Plan Note (Signed)
Left knee pain, likely due to DJD, meniscal tear? Discussed possible conservative treatment with rest, ice, ibuprofen versus referral. She elected conservative treatment. See instructions, will call if not better.

## 2012-04-30 NOTE — Patient Instructions (Addendum)
Ice 2 times a day as needed  Knee sleeve Ibuprofen (Motrin) 200 mg OTC 2 or 3 tabs every 8 hours as need for pain . This medicine can cause gastritis-ulcers in the stomach, if you develop nausea, stomach pain, change in the color of stools : stop ibuprofen and call us  Call if no better

## 2012-04-30 NOTE — Progress Notes (Signed)
  Subjective:    Patient ID: Emily Parrish, female    DOB: [birth year], 66 y.o.   MRN: [id number]  HPI Routine checkup, her main concern today is knee pain. Left knee pain started 2 weeks ago, worse with standing from the sitting position, actually walking help a little. Denies any injury, no actual swelling. Has taken ibuprofen with some relief.  Past Medical History: Goiter Vit D deficinecy (f/u per gyn) B12 def   Anxiety (panik attacks) NASH , s/p liver Bx 2011, early portal fibrosis   Mild OSA per Apnealynk   uretheral stricture , s/p dilatation per urology ~ 2005 GERD, 2005: EGD showed [facility] 2005: CT of the chest and  ultrasound of the legs negative for PE or DVT.  Stress test negative  Past Surgical History: Appendectomy  Family History: CAD - MGF (MI late in life) stroke - no DM - no HTN - MGF colon Ca - no breast Ca - M aunt M - deceased scleroderma F - deceased emphysema +smoker  Social History: Single, no children Tobacco-- quit in the 70s Alcohol use-yes-- occ, 1 glass of wine some nights Illicit Drug Use - no Occupation:carrer managment consultant   exercise-- still able to walk ~ 1  Mile some days   diet-- healthy, improved compared to previous years   Review of Systems As far as her chronic medical issues, medication list is reviewed, labs are reviewed Saw Dr. Loanne Drilling, has thyroiditis, at risk of hypothyroidism. Saw hematology  for leukopenia and thrombocytopenia, he recommend observation      Objective:   Physical Exam General -- alert, well-developed, and well-nourished.    Extremities-- no pretibial edema bilaterally Right knee normal Left knee: No warm, mild swelling in the frontal and lateral aspect without actual effusion, redness or tenderness. Range of motion is normal, no crepitus. Joint is stable. Psych-- Cognition and judgment appear intact. Alert and cooperative with normal attention span and concentration.  not anxious appearing and  not depressed appearing.        Assessment & Plan:

## 2012-05-01 ENCOUNTER — Encounter: Payer: Self-pay | Admitting: Internal Medicine

## 2012-05-15 ENCOUNTER — Other Ambulatory Visit: Payer: Self-pay | Admitting: Internal Medicine

## 2012-05-15 NOTE — Telephone Encounter (Signed)
Refill done.  

## 2012-08-07 ENCOUNTER — Telehealth: Payer: Self-pay | Admitting: *Deleted

## 2012-08-07 NOTE — Telephone Encounter (Signed)
Message copied by Lance Morin on Wed Aug 07, 2012  9:44 AM ------      Message from: Marlon Pel      Created: Mon Jan 08, 2012 10:06 AM       Needs labs see lft results from 01/08/12.  Labs are entered.  Please notify the patient

## 2012-08-07 NOTE — Telephone Encounter (Signed)
Informed pt she needs to come for labs to ensure her LFTs have remained normal; lab is in. Pt stated understanding.

## 2012-09-20 ENCOUNTER — Other Ambulatory Visit: Payer: Self-pay | Admitting: Internal Medicine

## 2012-10-04 ENCOUNTER — Other Ambulatory Visit: Payer: Self-pay | Admitting: Gastroenterology

## 2012-10-04 NOTE — Telephone Encounter (Signed)
PATIENT WILL NEED OFFICE VISIT FOR FURTHER REFILLS

## 2012-11-09 ENCOUNTER — Other Ambulatory Visit: Payer: Self-pay | Admitting: Gastroenterology

## 2012-11-11 NOTE — Telephone Encounter (Signed)
PATIENT NEEDS OFFICE VISIT FOR FURTHER REFILLS

## 2012-11-19 ENCOUNTER — Encounter: Payer: Self-pay | Admitting: Internal Medicine

## 2012-11-19 ENCOUNTER — Ambulatory Visit (INDEPENDENT_AMBULATORY_CARE_PROVIDER_SITE_OTHER): Payer: Managed Care, Other (non HMO) | Admitting: Internal Medicine

## 2012-11-19 VITALS — BP 112/74 | HR 62 | Temp 98.0°F | Ht 71.75 in | Wt 196.0 lb

## 2012-11-19 DIAGNOSIS — E042 Nontoxic multinodular goiter: Secondary | ICD-10-CM

## 2012-11-19 DIAGNOSIS — R5383 Other fatigue: Secondary | ICD-10-CM

## 2012-11-19 DIAGNOSIS — R5381 Other malaise: Secondary | ICD-10-CM

## 2012-11-19 DIAGNOSIS — E538 Deficiency of other specified B group vitamins: Secondary | ICD-10-CM

## 2012-11-19 DIAGNOSIS — G4733 Obstructive sleep apnea (adult) (pediatric): Secondary | ICD-10-CM

## 2012-11-19 DIAGNOSIS — F411 Generalized anxiety disorder: Secondary | ICD-10-CM

## 2012-11-19 DIAGNOSIS — Z0000 Encounter for general adult medical examination without abnormal findings: Secondary | ICD-10-CM

## 2012-11-19 LAB — CBC WITH DIFFERENTIAL/PLATELET
Basophils Absolute: 0 10*3/uL (ref 0.0–0.1)
Basophils Relative: 0.7 % (ref 0.0–3.0)
Eosinophils Absolute: 0.1 10*3/uL (ref 0.0–0.7)
Hemoglobin: 13.9 g/dL (ref 12.0–15.0)
Lymphocytes Relative: 31.1 % (ref 12.0–46.0)
Lymphs Abs: 1.3 10*3/uL (ref 0.7–4.0)
MCHC: 34 g/dL (ref 30.0–36.0)
MCV: 94.3 fl (ref 78.0–100.0)
Monocytes Absolute: 0.3 10*3/uL (ref 0.1–1.0)
Neutro Abs: 2.5 10*3/uL (ref 1.4–7.7)
RDW: 13 % (ref 11.5–14.6)

## 2012-11-19 LAB — COMPREHENSIVE METABOLIC PANEL
ALT: 50 U/L — ABNORMAL HIGH (ref 0–35)
AST: 41 U/L — ABNORMAL HIGH (ref 0–37)
Alkaline Phosphatase: 51 U/L (ref 39–117)
Calcium: 9.1 mg/dL (ref 8.4–10.5)
Chloride: 106 mEq/L (ref 96–112)
Creatinine, Ser: 1.1 mg/dL (ref 0.4–1.2)
Potassium: 4.4 mEq/L (ref 3.5–5.1)

## 2012-11-19 LAB — VITAMIN B12: Vitamin B-12: 273 pg/mL (ref 211–911)

## 2012-11-19 NOTE — Assessment & Plan Note (Signed)
Symptoms well-controlled at the present time

## 2012-11-19 NOTE — Assessment & Plan Note (Signed)
Status post a biopsy 03-2012, lymphocytic  thyroiditis Plan is to check a TSH from time to time, followup with Dr. Loanne Drilling [phone]

## 2012-11-19 NOTE — Assessment & Plan Note (Signed)
Not on daily supplements, labs

## 2012-11-19 NOTE — Assessment & Plan Note (Signed)
Doing okay at this point

## 2012-11-19 NOTE — Assessment & Plan Note (Signed)
On supplements daily, recheck labs

## 2012-11-19 NOTE — Assessment & Plan Note (Addendum)
Td 08 pneumonia shot 12-09 zostavax 2009  PAPs and MMG per gyn  DEXA per gyn Cscope 2005, unremarkable; was rec next in 10 years per patient  Diet-exercise doing well, diet info provided  Labs, requests advance lipid testing: check a  NMR

## 2012-11-19 NOTE — Assessment & Plan Note (Signed)
Not   using a CPAP @ present, plans to restart soon

## 2012-11-19 NOTE — Progress Notes (Signed)
  Subjective:    Patient ID: Emily Parrish, female    DOB: 09-May-1946, 67 y.o.   MRN: [id number]  HPI CPX  Past Medical History  Diagnosis Date  . Vitamin D deficiency   . Goiter   . Anxiety   . Esophageal reflux   . Hiatal hernia   . Fatty liver      NASH--s/p liver Bx 2011, early portal fibrosis   . OSA (obstructive sleep apnea) 12/07/2010    Apnealink 12/01/10>>AHI 9, RDI 10, SpO2 low 82%.   [name] Urethral stricture     uretheral stricture , s/p dilatation per urology ~ 2005  . Vitamin B 12 deficiency     mild    Past Surgical History  Procedure Laterality Date  . Appendectomy     History   Social History  . Marital Status: Divorced    Spouse Name: N/A    Number of Children: 0  . Years of Education: N/A   Occupational History  . 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 Smoking status: Former Smoker -- 10 years    Types: Cigarettes    Quit date: 07/11/1971  . Smokeless tobacco: Never Used     Comment: 1 1/2 pack per week  . Alcohol Use: Yes     Comment: Glass of wine  a day   . Drug Use: No  . Sexually Active: Not on file   Other Topics Concern  . Not on file   Social History Narrative   Lives by herself   Exercise: walks ~ 1.5 to 2 miles x 5/week   Diet: healthy most of the time   Family History  Problem Relation Age of Onset  . Coronary artery disease Maternal Grandfather   . Breast cancer Maternal Aunt   . Scleroderma Mother   . Emphysema Father   . Colon cancer Neg Hx   . Diabetes Neg Hx      Review of Systems In general doing well, denies chest pain, shortness or breath. On citalopram, no anxiety or depression at the present time. No dysuria or gross hematuria. At some point complained of fatigue, currently doing okay. I also reviewed the chart in reference to her chronic medical issues, see assessment and plan.    Objective:   Physical Exam BP 112/74  Pulse 62  Temp(Src) 98 F (36.7 C) (Oral)   Ht 5' 11.75" (1.822 m)  Wt 196 lb (88.905 kg)  BMI 26.78 kg/m2  SpO2 96%  General -- alert, well-developed, No apparent distress  Neck --Slightly enlarged thyroid, nontender  Lungs -- normal respiratory effort, no intercostal retractions, no accessory muscle use, and normal breath sounds.   Heart-- normal rate, regular rhythm, no murmur, and no gallop.   Abdomen--soft, non-tender, no distention, no masses, no HSM, no guarding, and no rigidity.   Extremities-- no pretibial edema bilaterally, Normal pedal pulses  Neurologic-- alert & oriented X3 and strength normal in all extremities. Psych-- Cognition and judgment appear intact. Alert and cooperative with normal attention span and concentration.  not anxious appearing and not depressed appearing.        Assessment & Plan:

## 2012-11-20 ENCOUNTER — Encounter: Payer: Self-pay | Admitting: Internal Medicine

## 2012-11-26 ENCOUNTER — Telehealth: Payer: Self-pay | Admitting: Internal Medicine

## 2012-11-26 DIAGNOSIS — E785 Hyperlipidemia, unspecified: Secondary | ICD-10-CM

## 2012-11-26 DIAGNOSIS — R7989 Other specified abnormal findings of blood chemistry: Secondary | ICD-10-CM

## 2012-11-26 NOTE — Telephone Encounter (Signed)
Advise patient: Her NMR test came back, it showed that her cholesterol quantity is high and actually the quality is very poor, her cholesterol is very "sticky" which puts her at a higher risk of heart disease, strokes. The liver tests are slightly elevated as usual. Plan: Needs treatment for cholesterol. In addition to a healthier diet and daily exercise I recommend Lipitor 20 mg one by mouth each bedtime #30 and 3 refills. Close followup of LFTs, come back in 4 weeks for an FLP, AST, ALT. Please send a copy of her labs and scan a copy of the NMR

## 2012-11-27 NOTE — Telephone Encounter (Signed)
noted 

## 2012-11-27 NOTE — Telephone Encounter (Signed)
lmovm for pt to return call.  

## 2012-11-27 NOTE — Telephone Encounter (Signed)
Pt notified of results. States that she will not take a Statin. Pt made an appt for 4 weeks to repeat labs (orders already placed). Copy of labs and lipids mailed to pt.

## 2012-12-04 ENCOUNTER — Other Ambulatory Visit: Payer: Self-pay | Admitting: *Deleted

## 2012-12-04 ENCOUNTER — Other Ambulatory Visit: Payer: Self-pay | Admitting: General Practice

## 2012-12-04 MED ORDER — CITALOPRAM HYDROBROMIDE 20 MG PO TABS: ORAL_TABLET | ORAL | Status: DC

## 2012-12-04 MED ORDER — RABEPRAZOLE SODIUM 20 MG PO TBEC: 20.0000 mg | DELAYED_RELEASE_TABLET | Freq: Every day | ORAL | Status: DC

## 2012-12-04 NOTE — Telephone Encounter (Signed)
Med filled.  

## 2012-12-04 NOTE — Telephone Encounter (Signed)
Patient needs office visit for further refills. Left message for patient to call back.

## 2012-12-10 ENCOUNTER — Encounter: Payer: Self-pay | Admitting: Internal Medicine

## 2012-12-17 ENCOUNTER — Ambulatory Visit (INDEPENDENT_AMBULATORY_CARE_PROVIDER_SITE_OTHER): Payer: Managed Care, Other (non HMO) | Admitting: Gastroenterology

## 2012-12-17 ENCOUNTER — Encounter: Payer: Self-pay | Admitting: Gastroenterology

## 2012-12-17 VITALS — BP 118/70 | HR 70 | Ht 71.75 in | Wt 192.1 lb

## 2012-12-17 DIAGNOSIS — K219 Gastro-esophageal reflux disease without esophagitis: Secondary | ICD-10-CM

## 2012-12-17 DIAGNOSIS — K7689 Other specified diseases of liver: Secondary | ICD-10-CM

## 2012-12-17 DIAGNOSIS — K760 Fatty (change of) liver, not elsewhere classified: Secondary | ICD-10-CM

## 2012-12-17 MED ORDER — RABEPRAZOLE SODIUM 20 MG PO TBEC: 20.0000 mg | DELAYED_RELEASE_TABLET | Freq: Every day | ORAL | Status: DC

## 2012-12-17 NOTE — Addendum Note (Signed)
Addended by: Hope Pigeon A on: 12/17/2012 04:59 PM   Modules accepted: Orders

## 2012-12-17 NOTE — Progress Notes (Signed)
This is a 67 year old Caucasian female with mild fatty infiltration of her liver confirmed by ultrasound exam.  Transaminases are less than twice normal.  She denies any history of hepatitis, but does use ethanol regularly.  There is no documented history of pancreatitis, hepatitis, and she denies any hepatobiliary symptoms.  Patient has chronic acid reflux managed with AcipHex 20 mg a day.  Last endoscopy and colonoscopy were 10 years ago.  Review of her labs shows no specific abnormalities.  In the past she did have some B12 deficiency and apparently is on by mouth replacement therapy.  Currently she denies acid reflux symptoms, dysphagia, melena, hematochezia, anorexia or weight loss.  Her appetite is good her weight is stable.  Current Medications, Allergies, Past Medical History, Past Surgical History, Family History and Social History were reviewed in [facility] record.  ROS: All systems were reviewed and are negative unless otherwise stated in the HPI.          Physical Exam: Blood pressure 118/70, pulse 70 and regular, and weight 192 with a BMI of 26.25.  I cannot appreciate stigmata of chronic liver disease.  Abdominal exam shows no organomegaly, masses or tenderness.  Bowel sounds are normal.  Mental status is normal.    Assessment and Plan: Stable fatty infiltration of the liver without any laboratory or physical evidence of portal hypertension or worsening liver disease.  Her GERD is well controlled with daily PPI therapy, and she is on B12 and calcium replacement.  I've renewed AcipHex, and reviewed standard antireflux maneuvers.  She is due for colonoscopy followup next October. No diagnosis found.

## 2012-12-17 NOTE — Patient Instructions (Signed)
  You will be due for a recall colonoscopy in October 2014. We will send you a reminder in the mail when it gets closer to that time.  New prescription for Aciphex was sent to your pharmacy.  _________________________________________________________________________                                               We are excited to introduce MyChart, a new best-in-class service that provides you online access to important information in your electronic medical record. We want to make it easier for you to view your health information - all in one secure location - when and where you need it. We expect MyChart will enhance the quality of care and service we provide.  When you register for MyChart, you can:    View your test results.    Request appointments and receive appointment reminders via email.    Request medication renewals.    View your medical history, allergies, medications and immunizations.    Communicate with your physician's office through a password-protected site.    Conveniently print information such as your medication lists.  To find out if MyChart is right for you, please talk to a member of our clinical staff today. We will gladly answer your questions about this free health and wellness tool.  If you are age 68 or older and want a member of your family to have access to your record, you must provide written consent by completing a proxy form available at our office. Please speak to our clinical staff about guidelines regarding accounts for patients younger than age 34.  As you activate your MyChart account and need any technical assistance, please call the MyChart technical support line at [phone] [phone]) or email your question to [email or web address]. If you email your question(s), please include your name, a return phone number and the best time to reach you.  If you have non-urgent health-related questions, you can send a message to our office  through [email or web address]. If you have a medical emergency, call 911.  Thank you for using MyChart as your new health and wellness resource!   MyChart licensed from [employer],  1999-2010. Patents Pending.

## 2013-01-01 ENCOUNTER — Encounter: Payer: Self-pay | Admitting: *Deleted

## 2013-01-01 ENCOUNTER — Other Ambulatory Visit (INDEPENDENT_AMBULATORY_CARE_PROVIDER_SITE_OTHER): Payer: Managed Care, Other (non HMO)

## 2013-01-01 DIAGNOSIS — K760 Fatty (change of) liver, not elsewhere classified: Secondary | ICD-10-CM

## 2013-01-01 DIAGNOSIS — R7989 Other specified abnormal findings of blood chemistry: Secondary | ICD-10-CM

## 2013-01-01 DIAGNOSIS — K7689 Other specified diseases of liver: Secondary | ICD-10-CM

## 2013-01-01 DIAGNOSIS — E785 Hyperlipidemia, unspecified: Secondary | ICD-10-CM

## 2013-01-01 LAB — LIPID PANEL
Cholesterol: 197 mg/dL (ref 0–200)
LDL Cholesterol: 133 mg/dL — ABNORMAL HIGH (ref 0–99)
Triglycerides: 152 mg/dL — ABNORMAL HIGH (ref 0.0–149.0)

## 2013-01-01 LAB — HEPATIC FUNCTION PANEL
ALT: 31 U/L (ref 0–35)
ALT: 33 U/L (ref 0–35)
AST: 29 U/L (ref 0–37)
AST: 30 U/L (ref 0–37)
Albumin: 4.1 g/dL (ref 3.5–5.2)
Albumin: 4.2 g/dL (ref 3.5–5.2)
Alkaline Phosphatase: 48 U/L (ref 39–117)
Bilirubin, Direct: 0 mg/dL (ref 0.0–0.3)
Total Protein: 7.6 g/dL (ref 6.0–8.3)
Total Protein: 7.6 g/dL (ref 6.0–8.3)

## 2013-01-07 ENCOUNTER — Encounter: Payer: Self-pay | Admitting: Certified Nurse Midwife

## 2013-01-08 ENCOUNTER — Ambulatory Visit (INDEPENDENT_AMBULATORY_CARE_PROVIDER_SITE_OTHER): Payer: Managed Care, Other (non HMO) | Admitting: Certified Nurse Midwife

## 2013-01-08 ENCOUNTER — Encounter: Payer: Self-pay | Admitting: Certified Nurse Midwife

## 2013-01-08 VITALS — BP 98/62 | HR 60 | Resp 16 | Ht 71.75 in | Wt 185.0 lb

## 2013-01-08 DIAGNOSIS — N952 Postmenopausal atrophic vaginitis: Secondary | ICD-10-CM

## 2013-01-08 DIAGNOSIS — Z01419 Encounter for gynecological examination (general) (routine) without abnormal findings: Secondary | ICD-10-CM

## 2013-01-08 NOTE — Patient Instructions (Signed)

## 2013-01-08 NOTE — Progress Notes (Signed)
67 y.o. G0P0000 Divorced Caucasian Fe here for annual exam. Menopausal no HRT. Patient denies vaginal bleeding or vaginal dryness. Not sexually active. Has been working on weight loss with 30 pound loss and now cholesterol normal range! Sees PCP for aex, and labs.  Denies any health issues today.  Patient's last menstrual period was 07/10/1996.          Sexually active: no  The current method of family planning is abstinence.    Exercising: yes  walking Smoker:  no  Health Maintenance: Pap:  01-02-12 neg MMG:  10-26-11 Colonoscopy:  2006 BMD:   10-26-11 TDaP:  2008 Labs: none Self breast exam: not done   reports that she quit smoking about 41 years ago. Her smoking use included Cigarettes. She smoked 0.00 packs per day for 10 years. She has never used smokeless tobacco. She reports that she drinks about 1.2 ounces of alcohol per week. She reports that she does not use illicit drugs.  Past Medical History  Diagnosis Date  . Vitamin D deficiency   . Goiter   . Anxiety   . Esophageal reflux   . Hiatal hernia   . Fatty liver      NASH--s/p liver Bx 2011, early portal fibrosis   . OSA (obstructive sleep apnea) 12/07/2010    Apnealink 12/01/10>>AHI 9, RDI 10, SpO2 low 82%.   [name] Urethral stricture     uretheral stricture , s/p dilatation per urology ~ 2005  . Vitamin B 12 deficiency     mild     Past Surgical History  Procedure Laterality Date  . Appendectomy      Current Outpatient Prescriptions  Medication Sig Dispense Refill  . Calcium Carbonate-Vitamin D (CALCIUM + D PO) Take by mouth every morning.      . citalopram (CELEXA) 20 MG tablet TAKE 1 TABLET BY MOUTH ONCE DAILY  90 tablet  3  . Coenzyme Q10 (CO Q 10 PO) Take by mouth daily.      . Docosahexaenoic Acid-EPA (ATABEX DHA) 250-100 MG CAPS Take by mouth every morning.      . Mag Aspart-Potassium Aspart (POTASSIUM & MAGNESIUM ASPARTAT PO) Take by mouth every morning.      . Multiple Vitamins-Minerals (MULTIVITAMIN PO) Take  by mouth daily.       . Omega-3 Fatty Acids (FISH OIL PO) Take by mouth daily.      . RABEprazole (ACIPHEX) 20 MG tablet Take 1 tablet (20 mg total) by mouth daily.  90 tablet  10  . Resveratrol 250 MG CAPS Take by mouth every morning.       No current facility-administered medications for this visit.    Family History  Problem Relation Age of Onset  . Coronary artery disease Maternal Grandfather   . Breast cancer Maternal Aunt   . Scleroderma Mother   . Emphysema Father   . Colon cancer Neg Hx     ROS:  Pertinent items are noted in HPI.  Otherwise, a comprehensive ROS was negative.  Exam:   BP 98/62  Pulse 60  Resp 16  Ht 5' 11.75" (1.822 m)  Wt 185 lb (83.915 kg)  BMI 25.28 kg/m2  LMP 07/10/1996 Height: 5' 11.75" (182.2 cm)  Ht Readings from Last 3 Encounters:  01/08/13 5' 11.75" (1.822 m)  12/17/12 5' 11.75" (1.822 m)  11/19/12 5' 11.75" (1.822 m)    General appearance: alert, cooperative and appears stated age Head: Normocephalic, without obvious abnormality, atraumatic Neck: no adenopathy, supple, symmetrical,  trachea midline and thyroid normal to inspection and palpation Lungs: clear to auscultation bilaterally Breasts: normal appearance, no masses or tenderness, No nipple retraction or dimpling, No nipple discharge or bleeding, No axillary or supraclavicular adenopathy Heart: regular rate and rhythm Abdomen: soft, non-tender; no masses,  no organomegaly Extremities: extremities normal, atraumatic, no cyanosis or edema Skin: Skin color, texture, turgor normal. No rashes or lesions Lymph nodes: Cervical, supraclavicular, and axillary nodes normal. No abnormal inguinal nodes palpated Neurologic: Grossly normal   Pelvic: External genitalia:  no lesions, atrophic appearance              Urethra:  normal appearing urethra with no masses, tenderness or lesions              Bartholin's and Skene's: normal                 Vagina: atrophic appearing vagina with normal  color and scant discharge, no lesions              Cervix: normal, non tender              Pap taken: no Bimanual Exam:  Uterus:  normal size, contour, position, consistency, mobility, non-tender and anteverted              Adnexa: normal adnexa and no mass, fullness, tenderness               Rectovaginal: Confirms               Anus:  normal sphincter tone, no lesions  A:  Well Woman with normal exam  Menopausal no HRT  Atrophic vaginitis  P:   Reviewed health and wellness pertinent to exam  Aware of importance of evaluation if vaginal bleeding  Discussed with patient again this year need to address dryness to decrease risk of infection and UTI occurrence.  Patient plans OTC Replens use or Olive oil.   Pap smear as per guidelines   Mammogram yearly stressed pap smear not taken today  counseled on breast self exam, mammography screening, feminine hygiene, adequate intake of calcium and vitamin D, diet and exercise, Kegel's exercises  return annually or prn  An After Visit Summary was printed and given to the patient.

## 2013-01-08 NOTE — Progress Notes (Signed)
Reviewed CPRomine, MD

## 2013-01-17 ENCOUNTER — Encounter: Payer: Managed Care, Other (non HMO) | Admitting: Internal Medicine

## 2013-02-07 ENCOUNTER — Encounter: Payer: Self-pay | Admitting: Gastroenterology

## 2013-12-08 ENCOUNTER — Telehealth: Payer: Self-pay | Admitting: *Deleted

## 2013-12-08 DIAGNOSIS — K760 Fatty (change of) liver, not elsewhere classified: Secondary | ICD-10-CM

## 2013-12-08 NOTE — Telephone Encounter (Signed)
Patient is due for yearly LFT's per Dr. Sharlett Iles. Is this ok?

## 2013-12-08 NOTE — Telephone Encounter (Signed)
Message copied by Hulan Saas on Mon Dec 08, 2013  1:05 PM ------      Message from: Hope Pigeon A      Created: Thu Aug 28, 2013  3:28 PM       Notes Recorded by Sable Feil, MD on 01/01/2013 at 3:44 PM            GREAT,,,lft'S ARE NORMAL,CHECK YEARLY ------

## 2013-12-08 NOTE — Telephone Encounter (Signed)
Yes, please order hepatic panel

## 2013-12-09 NOTE — Telephone Encounter (Signed)
Lab in [facility]. Left a message for patient to call me.

## 2013-12-10 NOTE — Telephone Encounter (Signed)
Patient does not have a preference for new GI MD. She will come for labs.

## 2013-12-11 ENCOUNTER — Telehealth: Payer: Self-pay | Admitting: *Deleted

## 2013-12-11 NOTE — Telephone Encounter (Signed)
Received fax from [facility] requesting patients [facility] urology related office note, urinalysis results and other labs/imaging results. Requested information needs to be faxed to them at [phone].

## 2014-01-13 ENCOUNTER — Encounter: Payer: Self-pay | Admitting: Certified Nurse Midwife

## 2014-01-13 ENCOUNTER — Ambulatory Visit (INDEPENDENT_AMBULATORY_CARE_PROVIDER_SITE_OTHER): Payer: Medicare HMO | Admitting: Certified Nurse Midwife

## 2014-01-13 VITALS — BP 100/64 | HR 68 | Resp 16 | Ht 72.0 in | Wt 195.0 lb

## 2014-01-13 DIAGNOSIS — Z01419 Encounter for gynecological examination (general) (routine) without abnormal findings: Secondary | ICD-10-CM

## 2014-01-13 DIAGNOSIS — Z124 Encounter for screening for malignant neoplasm of cervix: Secondary | ICD-10-CM

## 2014-01-13 NOTE — Progress Notes (Signed)
68 y.o. G0P0000 Divorced Caucasian Fe here for annual exam. Menopausal no HRT, denies vaginal  Bleeding or vaginal dryness issues. Sees PCP for cholesterol management, labs and aex. Sees GI as needed for liver follow up(something seen years ago resolved) and colonoscopy, which is due 2016. Patient has decided to be fitted for hearing aids due to change in hearing again.  No health issues today.  Patient's last menstrual period was 07/10/1996.          Sexually active: No.  The current method of family planning is post menopausal status.    Exercising: Yes.    walking Smoker:  no  Health Maintenance: Pap: 12-13-11 neg MMG: 02-13-13 bi-rads1: neg Colonoscopy:  2006 BMD:   10-26-11 TDaP:  2008 Labs: pcp Self breast exam: not done   reports that she quit smoking about 42 years ago. Her smoking use included Cigarettes. She smoked 0.00 packs per day for 10 years. She has never used smokeless tobacco. She reports that she drinks about 1.2 - 2.4 ounces of alcohol per week. She reports that she does not use illicit drugs.  Past Medical History  Diagnosis Date  . Vitamin D deficiency   . Goiter   . Anxiety   . Esophageal reflux   . Hiatal hernia   . Fatty liver      NASH--s/p liver Bx 2011, early portal fibrosis   . OSA (obstructive sleep apnea) 12/07/2010    Apnealink 12/01/10>>AHI 9, RDI 10, SpO2 low 82%.   [name] Urethral stricture     uretheral stricture , s/p dilatation per urology ~ 2005  . Vitamin B 12 deficiency     mild     Past Surgical History  Procedure Laterality Date  . Appendectomy      Current Outpatient Prescriptions  Medication Sig Dispense Refill  . Calcium Carbonate-Vitamin D (CALCIUM + D PO) Take by mouth every morning.      . citalopram (CELEXA) 20 MG tablet TAKE 1 TABLET BY MOUTH ONCE DAILY  90 tablet  3  . Coenzyme Q10 (CO Q 10 PO) Take by mouth daily.      . Docosahexaenoic Acid-EPA (ATABEX DHA) 250-100 MG CAPS Take by mouth every morning.      . Mag  Aspart-Potassium Aspart (POTASSIUM & MAGNESIUM ASPARTAT PO) Take by mouth every morning.      . Multiple Vitamins-Minerals (MULTIVITAMIN PO) Take by mouth daily.       . Omega-3 Fatty Acids (FISH OIL PO) Take by mouth daily.      . RABEprazole (ACIPHEX) 20 MG tablet Take 1 tablet (20 mg total) by mouth daily.  90 tablet  10  . Resveratrol 250 MG CAPS Take by mouth every morning.       No current facility-administered medications for this visit.    Family History  Problem Relation Age of Onset  . Coronary artery disease Maternal Grandfather   . Breast cancer Maternal Aunt   . Scleroderma Mother   . Emphysema Father   . Colon cancer Neg Hx     ROS:  Pertinent items are noted in HPI.  Otherwise, a comprehensive ROS was negative.  Exam:   LMP 07/10/1996    Ht Readings from Last 3 Encounters:  01/08/13 5' 11.75" (1.822 m)  12/17/12 5' 11.75" (1.822 m)  11/19/12 5' 11.75" (1.822 m)    General appearance: alert, cooperative and appears stated age Head: Normocephalic, without obvious abnormality, atraumatic Neck: no adenopathy, supple, symmetrical, trachea midline and thyroid normal  to inspection and palpation and non-palpable Lungs: clear to auscultation bilaterally Breasts: normal appearance, no masses or tenderness, No nipple retraction or dimpling, No nipple discharge or bleeding, No axillary or supraclavicular adenopathy Heart: regular rate and rhythm Abdomen: soft, non-tender; no masses,  no organomegaly Extremities: extremities normal, atraumatic, no cyanosis or edema Skin: Skin color, texture, turgor normal. No rashes or lesions Lymph nodes: Cervical, supraclavicular, and axillary nodes normal. No abnormal inguinal nodes palpated Neurologic: Grossly normal   Pelvic: External genitalia:  no lesions              Urethra:  normal appearing urethra with no masses, tenderness or lesions              Bartholin's and Skene's: normal                 Vagina: normal appearing  vagina with normal color and discharge, no lesions, scant moisture noted              Cervix: non tender, flat appearance against posterior vaginal wall,  scant blood with pap smear only              Pap taken: Yes.   patient aware of pap smear guidelines, but feels strongly about pap assessment with pap smear this year. Bimanual Exam:  Uterus:  normal size, contour, position, consistency, mobility, non-tender and mid position              Adnexa: normal adnexa and no mass, fullness, tenderness               Rectovaginal: Confirms               Anus:  normal sphincter tone, no lesions  A:  Well Woman with normal exam  Menopausal no HRT  History of Vitamin D deficiency  Anxiety on stable medication with PCP management  Colonoscopy due 2016, GI visit soon   P:   Reviewed health and wellness pertinent to exam  Aware of need for evaluation if vaginal bleeding  Lab: Vit. D  Pap smear taken today, discussed current guidelines and recommendation after 65 again.  Continue follow up as indicated.   counseled on breast self exam, mammography screening, adequate intake of calcium and vitamin D, diet and exercise, Kegel's exercises   return annually or prn  An After Visit Summary was printed and given to the patient.

## 2014-01-13 NOTE — Patient Instructions (Signed)

## 2014-01-14 ENCOUNTER — Other Ambulatory Visit: Payer: Self-pay | Admitting: Internal Medicine

## 2014-01-14 DIAGNOSIS — F32A Depression, unspecified: Secondary | ICD-10-CM

## 2014-01-14 DIAGNOSIS — F329 Major depressive disorder, single episode, unspecified: Secondary | ICD-10-CM

## 2014-01-14 LAB — IPS PAP SMEAR ONLY

## 2014-01-14 LAB — VITAMIN D 25 HYDROXY (VIT D DEFICIENCY, FRACTURES): Vit D, 25-Hydroxy: 47 ng/mL (ref 30–89)

## 2014-01-14 NOTE — Telephone Encounter (Signed)
Refill for #45 of celexa sent to [facility]. Note to patient that she is overdue for office visit. Letter sent to patient regarding overdue CPE.

## 2014-01-16 NOTE — Progress Notes (Signed)
Reviewed personally.  M. Suzanne Angelyna Henderson, MD.  

## 2014-01-18 ENCOUNTER — Other Ambulatory Visit: Payer: Self-pay | Admitting: Gastroenterology

## 2014-01-30 ENCOUNTER — Other Ambulatory Visit (INDEPENDENT_AMBULATORY_CARE_PROVIDER_SITE_OTHER): Payer: Managed Care, Other (non HMO)

## 2014-01-30 DIAGNOSIS — K760 Fatty (change of) liver, not elsewhere classified: Secondary | ICD-10-CM

## 2014-01-30 DIAGNOSIS — K7689 Other specified diseases of liver: Secondary | ICD-10-CM

## 2014-01-30 LAB — HEPATIC FUNCTION PANEL
ALK PHOS: 49 U/L (ref 39–117)
ALT: 19 U/L (ref 0–35)
AST: 23 U/L (ref 0–37)
Albumin: 4 g/dL (ref 3.5–5.2)
BILIRUBIN DIRECT: 0 mg/dL (ref 0.0–0.3)
BILIRUBIN TOTAL: 0.6 mg/dL (ref 0.2–1.2)
Total Protein: 7.5 g/dL (ref 6.0–8.3)

## 2014-02-04 ENCOUNTER — Encounter: Payer: Self-pay | Admitting: *Deleted

## 2014-02-10 ENCOUNTER — Ambulatory Visit (INDEPENDENT_AMBULATORY_CARE_PROVIDER_SITE_OTHER): Payer: Managed Care, Other (non HMO) | Admitting: Gastroenterology

## 2014-02-10 ENCOUNTER — Ambulatory Visit: Payer: Managed Care, Other (non HMO) | Admitting: Gastroenterology

## 2014-02-10 ENCOUNTER — Encounter: Payer: Self-pay | Admitting: Gastroenterology

## 2014-02-10 VITALS — BP 130/70 | HR 70 | Ht 71.75 in | Wt 196.8 lb

## 2014-02-10 DIAGNOSIS — Z1211 Encounter for screening for malignant neoplasm of colon: Secondary | ICD-10-CM

## 2014-02-10 DIAGNOSIS — K219 Gastro-esophageal reflux disease without esophagitis: Secondary | ICD-10-CM

## 2014-02-10 MED ORDER — RANITIDINE HCL 150 MG PO TABS: 150.0000 mg | ORAL_TABLET | Freq: Two times a day (BID) | ORAL | Status: DC

## 2014-02-10 MED ORDER — MOVIPREP 100 G PO SOLR: 1.0000 | Freq: Once | ORAL | Status: DC

## 2014-02-10 NOTE — Progress Notes (Signed)
Agree with initial assessment and plans

## 2014-02-10 NOTE — Patient Instructions (Signed)
You have been scheduled for a colonoscopy. Please follow written instructions given to you at your visit today.  Please pick up your prep kit at the pharmacy within the next 1-3 days. If you use inhalers (even only as needed), please bring them with you on the day of your procedure. Your physician has requested that you go to www.startemmi.com and enter the access code given to you at your visit today. This web site gives a general overview about your procedure. However, you should still follow specific instructions given to you by our office regarding your preparation for the procedure.  We have sent the following medications to [facility] for you: Zantac 150 mg, please take one tablet by mouth twice daily

## 2014-02-10 NOTE — Progress Notes (Signed)
     02/10/2014 Emily Parrish [id number] 1946/04/30   History of Present Illness:  This is a pleasant 68 year old female who is previously known to Dr. Sharlett Iles for treatment of GERD.  She is here today to schedule screening colonoscopy.  Her last colonoscopy was in 04/2004 at which time the study was normal.  Denies any rectal bleeding or bowel issues.  She also wants to discuss her GERD treatment.  She has been on aciphex 20 mg daily for quite a while with good control of her symptoms.  Despite Calcium and Vitamin D supplementation, she is still concerned about long term risks with taking PPI and would like to either discontinue medication altogether or switch to a non-PPI medication.  She had an EGD in 12/2003 at which time she was found to have only a hiatal hernia.     Current Medications, Allergies, Past Medical History, Past Surgical History, Family History and Social History were reviewed in [employer] record.   Physical Exam: BP 130/70  Pulse 70  Ht 5' 11.75" (1.822 m)  Wt 196 lb 12.8 oz (89.268 kg)  BMI 26.89 kg/m2  LMP 07/10/1996 General: Well developed white female in no acute distress Head: Normocephalic and atraumatic Eyes:  Sclerae anicteric, conjunctiva pink  Ears: Normal auditory acuity Lungs: Clear throughout to auscultation Heart: Regular rate and rhythm Abdomen: Soft, non-distended.  Normal bowel sounds.  Non-tender. Rectal:  Deferred.  Will be done at the time of colonoscopy. Musculoskeletal: Symmetrical with no gross deformities  Extremities: No edema  Neurological: Alert oriented x 4, grossly non-focal Psychological:  Alert and cooperative. Normal mood and affect  Assessment and Recommendations: -Screening colonoscopy:  Last was 10 years ago.  Will schedule with Dr. Henrene Pastor.  The risks, benefits, and alternatives were discussed with the patient and she consents to proceed.  -GERD:  Well controlled on Aciphex daily, but patient wants  to see about getting off of PPI.  Will switch her to zantac 150 mg BID for now and see how she does with that instead; can reduce to once daily from there if she does well.  Will prescribe.

## 2014-02-20 ENCOUNTER — Encounter: Payer: Self-pay | Admitting: Internal Medicine

## 2014-04-02 ENCOUNTER — Telehealth: Payer: Self-pay | Admitting: Gastroenterology

## 2014-04-02 MED ORDER — RABEPRAZOLE SODIUM 20 MG PO TBEC: 20.0000 mg | DELAYED_RELEASE_TABLET | Freq: Every day | ORAL | Status: DC

## 2014-04-02 NOTE — Telephone Encounter (Signed)
Ok to order 

## 2014-04-02 NOTE — Telephone Encounter (Signed)
Spoke with patient and she states the  Zantac BID is not working for her. She is having heartburn, belching and feels over full. She wants to go back to the Aciphex daily. Is this ok to order?

## 2014-04-02 NOTE — Telephone Encounter (Signed)
Rx sent to pharmacy. Patient aware.

## 2014-04-08 ENCOUNTER — Encounter: Payer: Self-pay | Admitting: Internal Medicine

## 2014-04-08 ENCOUNTER — Ambulatory Visit (INDEPENDENT_AMBULATORY_CARE_PROVIDER_SITE_OTHER): Payer: Managed Care, Other (non HMO) | Admitting: Internal Medicine

## 2014-04-08 VITALS — BP 128/87 | HR 65 | Temp 98.2°F | Ht 72.0 in | Wt 200.5 lb

## 2014-04-08 DIAGNOSIS — Z0000 Encounter for general adult medical examination without abnormal findings: Secondary | ICD-10-CM

## 2014-04-08 DIAGNOSIS — F411 Generalized anxiety disorder: Secondary | ICD-10-CM

## 2014-04-08 DIAGNOSIS — E042 Nontoxic multinodular goiter: Secondary | ICD-10-CM

## 2014-04-08 DIAGNOSIS — E039 Hypothyroidism, unspecified: Secondary | ICD-10-CM

## 2014-04-08 DIAGNOSIS — R350 Frequency of micturition: Secondary | ICD-10-CM

## 2014-04-08 DIAGNOSIS — R5381 Other malaise: Secondary | ICD-10-CM

## 2014-04-08 DIAGNOSIS — R5383 Other fatigue: Secondary | ICD-10-CM

## 2014-04-08 DIAGNOSIS — I7789 Other specified disorders of arteries and arterioles: Secondary | ICD-10-CM

## 2014-04-08 DIAGNOSIS — G4733 Obstructive sleep apnea (adult) (pediatric): Secondary | ICD-10-CM

## 2014-04-08 DIAGNOSIS — Z23 Encounter for immunization: Secondary | ICD-10-CM

## 2014-04-08 DIAGNOSIS — E785 Hyperlipidemia, unspecified: Secondary | ICD-10-CM

## 2014-04-08 DIAGNOSIS — E782 Mixed hyperlipidemia: Secondary | ICD-10-CM | POA: Insufficient documentation

## 2014-04-08 LAB — POCT URINALYSIS DIPSTICK
Bilirubin, UA: NEGATIVE
GLUCOSE UA: NEGATIVE
Ketones, UA: NEGATIVE
NITRITE UA: NEGATIVE
PH UA: 6.5
Urobilinogen, UA: 0.2

## 2014-04-08 MED ORDER — NITROFURANTOIN MONOHYD MACRO 100 MG PO CAPS: 100.0000 mg | ORAL_CAPSULE | Freq: Two times a day (BID) | ORAL | Status: DC

## 2014-04-08 NOTE — Patient Instructions (Signed)
Get your blood work before you leave   UTI: Take the antibiotic  as prescribed Drink plenty of fluids Call if no better  If you need more information about a healthy diet, heart disease, diabetes, hypertension visit  the American Heart Association, it  is a great resource online at:  http://www.richard-flynn.net/     Please come back to the office in 1 year for a physical exam. Come back fasting         Fall Prevention and Home Safety Falls cause injuries and can affect all age groups. It is possible to use preventive measures to significantly decrease the likelihood of falls. There are many simple measures which can make your home safer and prevent falls. OUTDOORS  Repair cracks and edges of walkways and driveways.  Remove high doorway thresholds.  Trim shrubbery on the main path into your home.  Have good outside lighting.  Clear walkways of tools, rocks, debris, and clutter.  Check that handrails are not broken and are securely fastened. Both sides of steps should have handrails.  Have leaves, snow, and ice cleared regularly.  Use sand or salt on walkways during winter months.  In the garage, clean up grease or oil spills. BATHROOM  Install night lights.  Install grab bars by the toilet and in the tub and shower.  Use non-skid mats or decals in the tub or shower.  Place a plastic non-slip stool in the shower to sit on, if needed.  Keep floors dry and clean up all water on the floor immediately.  Remove soap buildup in the tub or shower on a regular basis.  Secure bath mats with non-slip, double-sided rug tape.  Remove throw rugs and tripping hazards from the floors. BEDROOMS  Install night lights.  Make sure a bedside light is easy to reach.  Do not use oversized bedding.  Keep a telephone by your bedside.  Have a firm chair with side arms to use for getting dressed.  Remove throw rugs and tripping hazards from the floor. KITCHEN  Keep handles  on pots and pans turned toward the center of the stove. Use back burners when possible.  Clean up spills quickly and allow time for drying.  Avoid walking on wet floors.  Avoid hot utensils and knives.  Position shelves so they are not too high or low.  Place commonly used objects within easy reach.  If necessary, use a sturdy step stool with a grab bar when reaching.  Keep electrical cables out of the way.  Do not use floor polish or wax that makes floors slippery. If you must use wax, use non-skid floor wax.  Remove throw rugs and tripping hazards from the floor. STAIRWAYS  Never leave objects on stairs.  Place handrails on both sides of stairways and use them. Fix any loose handrails. Make sure handrails on both sides of the stairways are as long as the stairs.  Check carpeting to make sure it is firmly attached along stairs. Make repairs to worn or loose carpet promptly.  Avoid placing throw rugs at the top or bottom of stairways, or properly secure the rug with carpet tape to prevent slippage. Get rid of throw rugs, if possible.  Have an electrician put in a light switch at the top and bottom of the stairs. OTHER FALL PREVENTION TIPS  Wear low-heel or rubber-soled shoes that are supportive and fit well. Wear closed toe shoes.  When using a stepladder, make sure it is fully opened and both  spreaders are firmly locked. Do not climb a closed stepladder.  Add color or contrast paint or tape to grab bars and handrails in your home. Place contrasting color strips on first and last steps.  Learn and use mobility aids as needed. Install an electrical emergency response system.  Turn on lights to avoid dark areas. Replace light bulbs that burn out immediately. Get light switches that glow.  Arrange furniture to create clear pathways. Keep furniture in the same place.  Firmly attach carpet with non-skid or double-sided tape.  Eliminate uneven floor surfaces.  Select a  carpet pattern that does not visually hide the edge of steps.  Be aware of all pets. OTHER HOME SAFETY TIPS  Set the water temperature for 120 F (48.8 C).  Keep emergency numbers on or near the telephone.  Keep smoke detectors on every level of the home and near sleeping areas. Document Released: 06/16/2002 Document Revised: 12/26/2011 Document Reviewed: 09/15/2011 [facility] Patient Information 2015 [place], [place]. This information is not intended to replace advice given to you by your health care provider. Make sure you discuss any questions you have with your health care provider.

## 2014-04-08 NOTE — Progress Notes (Signed)
Subjective:    Patient ID: Emily Parrish, female    DOB: 1946-06-20, 68 y.o.   MRN: [id number]  DOS:  04/08/2014 Type of visit - description :  Here for Medicare AWV:  1. Risk factors based on Past M, S, F history: reviewed 2. Physical Activities:  Walks regularly, active in general 3. Depression/mood: neg screening  4. Hearing:  HOH, saw audiology, to get aids  5. ADL's:  Independent  6. Fall Risk: neg screen 7. home Safety: does feel safe at home  8. Height, weight, & visual acuity: see VS, ~ the same  , uses reading glasses, rec to see eye doctor  9. Counseling: provided 10. Labs ordered based on risk factors: if needed  11. Referral Coordination: if needed 12. Care Plan, see assessment and plan  13. Cognitive Assessment: motor skills and cognition appropriate   In addition, today we discussed the following: On citalopram, symptoms well-controlled UTI? 3 or 4 days history of a funny odor of the urine and some dysuria. GERD, on PPIs, symptoms well-controlled Sleep apnea, uses a CPAP most nights, energy level better than in previous years. High cholesterol, was recommended statins, decided not to take them. See assessment and plan.   ROS Denies fever or chills Chest pain, difficulty breathing or lower extremity edema No nausea, vomiting, diarrhea blood in the stools, no abdominal or suprapubic pain. No cough or respiratory symptoms No gross hematuria or flank pain  Past Medical History  Diagnosis Date  . Vitamin D deficiency   . Goiter   . Anxiety   . Esophageal reflux   . Hiatal hernia   . Fatty liver      NASH--s/p liver Bx 2011, early portal fibrosis   . OSA (obstructive sleep apnea) 12/07/2010    Apnealink 12/01/10>>AHI 9, RDI 10, SpO2 low 82%.   [name] Urethral stricture     uretheral stricture , s/p dilatation per urology ~ 2005  . Vitamin B 12 deficiency     mild     Past Surgical History  Procedure Laterality Date  . Appendectomy    . Colonoscopy   2005    NORMAL  . Esophagogastroduodenoscopy  2005    [place]    History   Social History  . Marital Status: Divorced    Spouse Name: N/A    Number of Children: 0  . Years of Education: N/A   Occupational History  . 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 Smoking status: Former Smoker -- 10 years    Types: Cigarettes    Quit date: 07/11/1971  . Smokeless tobacco: Never Used     Comment: used to smoke--1 1/2 pack per week  . Alcohol Use: 1.8 oz/week    3 Glasses of wine per week     Comment: socially   . Drug Use: No  . Sexual Activity: No   Other Topics Concern  . Not on file   Social History Narrative   Lives by herself , no family in town   emergency contact Graceann Congress [phone])     Family History  Problem Relation Age of Onset  . Coronary artery disease Maternal Grandfather   . Breast cancer Maternal Aunt   . Scleroderma Mother   . Emphysema Father   . Colon cancer Neg Hx        Medication List       This list is accurate as of: 04/08/14  4:54 PM.  Always use your most recent med list.               CALCIUM + D PO  Take by mouth every morning.     citalopram 20 MG tablet  Commonly known as:  CELEXA  Take 1 tablet (20 mg total) by mouth daily. Over due for office visit. Please call [phone] for appointment with Dr. Larose Kells     CO Q 10 PO  Take by mouth daily.     FISH OIL PO  Take by mouth daily.     MILK THISTLE PO  Take by mouth daily.     MULTIVITAMIN PO  Take by mouth daily.     nitrofurantoin (macrocrystal-monohydrate) 100 MG capsule  Commonly known as:  MACROBID  Take 1 capsule (100 mg total) by mouth 2 (two) times daily.     POTASSIUM & MAGNESIUM ASPARTAT PO  Take by mouth every morning.     RABEprazole 20 MG tablet  Commonly known as:  ACIPHEX  Take 1 tablet (20 mg total) by mouth daily.     Resveratrol 250 MG Caps  Take by mouth every morning.           Objective:   Physical Exam BP  128/87  Pulse 65  Temp(Src) 98.2 F (36.8 C) (Oral)  Ht 6' (1.829 m)  Wt 200 lb 8 oz (90.946 kg)  BMI 27.19 kg/m2  SpO2 99%  LMP 07/10/1996 General -- alert, well-developed, NAD.  Neck --minimal  Thyromegaly R>L?, no tender  HEENT-- Not pale.  Lungs -- normal respiratory effort, no intercostal retractions, no accessory muscle use, and normal breath sounds.  Heart-- normal rate, regular rhythm, no murmur.  Abdomen-- Not distended, good bowel sounds,soft, non-tender. Palpable , no tender Ao, no bruit  Extremities-- no pretibial edema bilaterally  Neurologic--  alert & oriented X3. Speech normal, gait appropriate for age, strength symmetric and appropriate for age.  Psych-- Cognition and judgment appear intact. Cooperative with normal attention span and concentration. No anxious or depressed appearing.      Assessment & Plan:    UTI? udip + blood Check a ucx macrobib  F2F > 40 min today

## 2014-04-08 NOTE — Assessment & Plan Note (Signed)
symptoms well-controlled

## 2014-04-08 NOTE — Assessment & Plan Note (Addendum)
has not seen Dr. Loanne Drilling ----->  check a ultrasound

## 2014-04-08 NOTE — Assessment & Plan Note (Signed)
Overall is feeling more energetic than in previous years.

## 2014-04-08 NOTE — Assessment & Plan Note (Addendum)
Td 08 pneumonia shot 12-09 prevnar -- today zostavax 2009 Flu shot-- likes the high dose, will get at her pharmacy PAPs and MMG per gyn  DEXA per gyn Cscope 2005, unremarkable; scheduled for a cscope next week  Diet-exercise -- pt states needs improvement, counseled  Palpable Ao --- check a u/s  Labs

## 2014-04-08 NOTE — Progress Notes (Signed)
Pre visit review using our clinic review tool, if applicable. No additional management support is needed unless otherwise documented below in the visit note. 

## 2014-04-08 NOTE — Assessment & Plan Note (Signed)
Advance  lipid testing last year showed high cholesterol . he was recommended statins, decided not to take it due to the potential for side effects. Risks of increased cholesterol including stroke-CAD discussed. Plan: Labs May need to recommended statins again, I also did mention Zetia as an option. Diet-exercise!

## 2014-04-08 NOTE — Assessment & Plan Note (Signed)
Using a CPAP most of the time

## 2014-04-09 LAB — BASIC METABOLIC PANEL
BUN: 16 mg/dL (ref 6–23)
CO2: 26 mEq/L (ref 19–32)
CREATININE: 1 mg/dL (ref 0.4–1.2)
Calcium: 8.9 mg/dL (ref 8.4–10.5)
Chloride: 104 mEq/L (ref 96–112)
GFR: 58.3 mL/min — ABNORMAL LOW (ref >60.00)
Glucose, Bld: 77 mg/dL (ref 70–99)
Potassium: 3.8 mEq/L (ref 3.5–5.1)
Sodium: 135 mEq/L (ref 135–145)

## 2014-04-09 LAB — URINALYSIS, ROUTINE W REFLEX MICROSCOPIC
Bilirubin Urine: NEGATIVE
KETONES UR: NEGATIVE
Nitrite: NEGATIVE
PH: 6.5 (ref 5.0–8.0)
Total Protein, Urine: NEGATIVE
UROBILINOGEN UA: 0.2 (ref 0.0–1.0)
Urine Glucose: NEGATIVE

## 2014-04-09 LAB — LIPID PANEL
CHOLESTEROL: 228 mg/dL — AB (ref 0–200)
HDL: 39.3 mg/dL (ref >39.00)
LDL CALC: 151 mg/dL — AB (ref 0–99)
NonHDL: 188.7
Total CHOL/HDL Ratio: 6
Triglycerides: 190 mg/dL — ABNORMAL HIGH (ref 0.0–149.0)
VLDL: 38 mg/dL (ref 0.0–40.0)

## 2014-04-09 LAB — TSH: TSH: 4.6 u[IU]/mL — AB (ref 0.35–4.50)

## 2014-04-10 ENCOUNTER — Ambulatory Visit (HOSPITAL_BASED_OUTPATIENT_CLINIC_OR_DEPARTMENT_OTHER): Payer: Managed Care, Other (non HMO)

## 2014-04-11 LAB — URINE CULTURE

## 2014-04-13 NOTE — Addendum Note (Signed)
Addended by: Wilfrid Lund on: 04/13/2014 09:51 AM   Modules accepted: Orders

## 2014-04-14 ENCOUNTER — Ambulatory Visit (HOSPITAL_BASED_OUTPATIENT_CLINIC_OR_DEPARTMENT_OTHER)
Admission: RE | Admit: 2014-04-14 | Discharge: 2014-04-14 | Disposition: A | Discharge status: Home / Self Care | Payer: Medicare HMO | Source: Ambulatory Visit | Attending: Internal Medicine | Admitting: Internal Medicine

## 2014-04-14 ENCOUNTER — Other Ambulatory Visit: Payer: Self-pay | Admitting: Internal Medicine

## 2014-04-14 DIAGNOSIS — I7789 Other specified disorders of arteries and arterioles: Secondary | ICD-10-CM

## 2014-04-14 DIAGNOSIS — E042 Nontoxic multinodular goiter: Secondary | ICD-10-CM

## 2014-04-15 ENCOUNTER — Encounter: Payer: Self-pay | Admitting: Internal Medicine

## 2014-04-15 ENCOUNTER — Ambulatory Visit (AMBULATORY_SURGERY_CENTER): Payer: Medicare HMO | Admitting: Internal Medicine

## 2014-04-15 ENCOUNTER — Other Ambulatory Visit: Payer: Self-pay | Admitting: Internal Medicine

## 2014-04-15 VITALS — BP 103/66 | HR 58 | Temp 96.6°F | Resp 16 | Ht 71.0 in | Wt 196.0 lb

## 2014-04-15 DIAGNOSIS — Z1211 Encounter for screening for malignant neoplasm of colon: Secondary | ICD-10-CM

## 2014-04-15 DIAGNOSIS — E042 Nontoxic multinodular goiter: Secondary | ICD-10-CM

## 2014-04-15 MED ORDER — SODIUM CHLORIDE 0.9 % IV SOLN: 500.0000 mL | INTRAVENOUS | Status: DC

## 2014-04-15 NOTE — Patient Instructions (Signed)
YOU HAD AN ENDOSCOPIC PROCEDURE TODAY AT [facility]: Refer to the procedure report that was given to you for any specific questions about what was found during the examination.  If the procedure report does not answer your questions, please call your gastroenterologist to clarify.  If you requested that your care partner not be given the details of your procedure findings, then the procedure report has been included in a sealed envelope for you to review at your convenience later.  YOU SHOULD EXPECT: Some feelings of bloating in the abdomen. Passage of more gas than usual.  Walking can help get rid of the air that was put into your GI tract during the procedure and reduce the bloating. If you had a lower endoscopy (such as a colonoscopy or flexible sigmoidoscopy) you may notice spotting of blood in your stool or on the toilet paper. If you underwent a bowel prep for your procedure, then you may not have a normal bowel movement for a few days.  DIET: Your first meal following the procedure should be a light meal and then it is ok to progress to your normal diet.  A half-sandwich or bowl of soup is an example of a good first meal.  Heavy or fried foods are harder to digest and may make you feel nauseous or bloated.  Likewise meals heavy in dairy and vegetables can cause extra gas to form and this can also increase the bloating.  Drink plenty of fluids but you should avoid alcoholic beverages for 24 hours.  ACTIVITY: Your care partner should take you home directly after the procedure.  You should plan to take it easy, moving slowly for the rest of the day.  You can resume normal activity the day after the procedure however you should NOT DRIVE or use heavy machinery for 24 hours (because of the sedation medicines used during the test).    SYMPTOMS TO REPORT IMMEDIATELY: A gastroenterologist can be reached at any hour.  During normal business hours, 8:30 AM to 5:00 PM Monday through Friday,  call [phone].  After hours and on weekends, please call the GI answering service at [phone] who will take a message and have the physician on call contact you.   Following lower endoscopy (colonoscopy or flexible sigmoidoscopy):  Excessive amounts of blood in the stool  Significant tenderness or worsening of abdominal pains  Swelling of the abdomen that is new, acute  Fever of 100F or higher    FOLLOW UP: If any biopsies were taken you will be contacted by phone or by letter within the next 1-3 weeks.  Call your gastroenterologist if you have not heard about the biopsies in 3 weeks.  Our staff will call the home number listed on your records the next business day following your procedure to check on you and address any questions or concerns that you may have at that time regarding the information given to you following your procedure. This is a courtesy call and so if there is no answer at the home number and we have not heard from you through the emergency physician on call, we will assume that you have returned to your regular daily activities without incident.  SIGNATURES/CONFIDENTIALITY: You and/or your care partner have signed paperwork which will be entered into your electronic medical record.  These signatures attest to the fact that that the information above on your After Visit Summary has been reviewed and is understood.  Full responsibility of the confidentiality  of this discharge information lies with you and/or your care-partner.  Normal colonoscopy-Repeat in 10 years-2025.

## 2014-04-15 NOTE — Op Note (Signed)
[place]  [address] [place], [id number]   COLONOSCOPY PROCEDURE REPORT  PATIENT: Emily Parrish, Emily Parrish  MR#: [id number] BIRTHDATE: 1946-03-01 , 67  yrs. old GENDER: female ENDOSCOPIST: Eustace Quail, MD REFERRED QP:RFFM Larose Kells, M.D. PROCEDURE DATE:  04/15/2014 PROCEDURE:   Colonoscopy, screening First Screening Colonoscopy - Avg.  risk and is 50 yrs.  old or older - No.  Prior Negative Screening - Now for repeat screening. 10 or more years since last screening  History of Adenoma - Now for follow-up colonoscopy & has been > or = to 3 yrs.  N/A  Polyps Removed Today? No.  Recommend repeat exam, <10 yrs? No. ASA CLASS:   Class II INDICATIONS:average risk for colorectal cancer. . Normal index examination 2005 (DRP) MEDICATIONS: Monitored anesthesia care and Propofol 160 mg IV  DESCRIPTION OF PROCEDURE:   After the risks benefits and alternatives of the procedure were thoroughly explained, informed consent was obtained.  The digital rectal exam revealed no abnormalities of the rectum.   The [id number]  endoscope was introduced through the anus and advanced to the cecum, which was identified by both the appendix and ileocecal valve. No adverse events experienced.   The quality of the prep was excellent, using MoviPrep  The instrument was then slowly withdrawn as the colon was fully examined.      COLON FINDINGS: A normal appearing cecum, ileocecal valve, and appendiceal orifice were identified.  The ascending, transverse, descending, sigmoid colon, and rectum appeared unremarkable. Retroflexed views revealed internal hemorrhoids. The time to cecum=2 minutes 17 seconds.  Withdrawal time=12 minutes 47 seconds. The scope was withdrawn and the procedure completed. COMPLICATIONS: There were no immediate complications.  ENDOSCOPIC IMPRESSION: Normal colonoscopy  RECOMMENDATIONS: Continue current colorectal screening recommendations for "routine risk"  patients with a repeat colonoscopy in 10 years.  eSigned:  Eustace Quail, MD 04/15/2014 10:37 AM   cc: Kathlene November, MD    ;. The patient

## 2014-04-15 NOTE — Progress Notes (Signed)
A/ox3, pleased with MAC, report to RN 

## 2014-04-16 ENCOUNTER — Telehealth: Payer: Self-pay | Admitting: *Deleted

## 2014-04-16 NOTE — Telephone Encounter (Signed)
  Follow up Call-  Call back number 04/15/2014  Post procedure Call Back phone  # [phone]  Permission to leave phone message Yes     Patient questions:  Do you have a fever, pain , or abdominal swelling? No. Pain Score  0 *  Have you tolerated food without any problems? Yes.    Have you been able to return to your normal activities? Yes.    Do you have any questions about your discharge instructions: Diet   No. Medications  No. Follow up visit  No.  Do you have questions or concerns about your Care? No.  Actions: * If pain score is 4 or above: No action needed, pain <4.

## 2014-04-21 ENCOUNTER — Other Ambulatory Visit (INDEPENDENT_AMBULATORY_CARE_PROVIDER_SITE_OTHER): Payer: Medicare HMO

## 2014-04-21 DIAGNOSIS — E039 Hypothyroidism, unspecified: Secondary | ICD-10-CM

## 2014-04-21 LAB — T4, FREE: Free T4: 0.7 ng/dL (ref 0.60–1.60)

## 2014-04-21 LAB — T3, FREE: T3, Free: 2.8 pg/mL (ref 2.3–4.2)

## 2014-04-29 ENCOUNTER — Telehealth: Payer: Self-pay | Admitting: Internal Medicine

## 2014-04-29 NOTE — Telephone Encounter (Signed)
error 

## 2014-05-06 ENCOUNTER — Other Ambulatory Visit: Payer: Self-pay | Admitting: Internal Medicine

## 2014-05-18 ENCOUNTER — Telehealth: Payer: Self-pay

## 2014-05-18 MED ORDER — RABEPRAZOLE SODIUM 20 MG PO TBEC: 20.0000 mg | DELAYED_RELEASE_TABLET | Freq: Every day | ORAL | Status: DC

## 2014-05-18 NOTE — Telephone Encounter (Signed)
Refilled Aciphex

## 2014-05-19 ENCOUNTER — Telehealth: Payer: Self-pay | Admitting: Internal Medicine

## 2014-05-19 NOTE — Telephone Encounter (Signed)
Chart not needed

## 2014-05-25 ENCOUNTER — Telehealth: Payer: Self-pay | Admitting: Internal Medicine

## 2014-05-25 ENCOUNTER — Telehealth: Payer: Self-pay

## 2014-05-25 MED ORDER — RABEPRAZOLE SODIUM 20 MG PO TBEC: 20.0000 mg | DELAYED_RELEASE_TABLET | Freq: Every day | ORAL | Status: DC

## 2014-05-25 NOTE — Telephone Encounter (Signed)
Sent 90 day supply of Aciphex

## 2014-05-25 NOTE — Telephone Encounter (Signed)
Sent 90 day supply per patient's request

## 2014-07-10 HISTORY — PX: BIOPSY THYROID: PRO38

## 2014-10-29 ENCOUNTER — Other Ambulatory Visit: Payer: Self-pay | Admitting: Internal Medicine

## 2015-01-19 ENCOUNTER — Encounter: Payer: Self-pay | Admitting: Certified Nurse Midwife

## 2015-01-19 ENCOUNTER — Ambulatory Visit (INDEPENDENT_AMBULATORY_CARE_PROVIDER_SITE_OTHER): Payer: Medicare HMO | Admitting: Certified Nurse Midwife

## 2015-01-19 VITALS — BP 110/72 | HR 70 | Resp 20 | Ht 72.0 in | Wt 209.0 lb

## 2015-01-19 DIAGNOSIS — Z01419 Encounter for gynecological examination (general) (routine) without abnormal findings: Secondary | ICD-10-CM | POA: Diagnosis not present

## 2015-01-19 NOTE — Progress Notes (Signed)
69 y.o. G0P0000 Divorced  Caucasian Fe here for annual exam. Menopausal no HRT. Denies vaginal bleeding or vaginal dryness. Sees PCP yearly with labs, aex and medication management for anxiety. Staying busy with working part time. Not planning to retire at this point. Plans working on weight control this year and increase in exercise. Colonoscopy last year and was negative. No heath issues today.  Patient's last menstrual period was 07/10/1996.          Sexually active: No.  The current method of family planning is post menopausal status.    Exercising: Yes.    walking Smoker:  no  Health Maintenance: Pap: 01-13-14 neg MMG: 02-13-14 category c density,birads 2:neg Colonoscopy: 2015 normal per patient BMD:   10-26-11 normal TDaP:  2008 Labs: pcp Self breast exam: not done   reports that she quit smoking about 43 years ago. Her smoking use included Cigarettes. She quit after 10 years of use. She has never used smokeless tobacco. She reports that she drinks about 1.8 - 3.0 oz of alcohol per week. She reports that she does not use illicit drugs.  Past Medical History  Diagnosis Date  . Vitamin D deficiency   . Goiter   . Anxiety   . Esophageal reflux   . Hiatal hernia   . Fatty liver      NASH--s/p liver Bx 2011, early portal fibrosis   . OSA (obstructive sleep apnea) 12/07/2010    Apnealink 12/01/10>>AHI 9, RDI 10, SpO2 low 82%.   [name] Urethral stricture     uretheral stricture , s/p dilatation per urology ~ 2005  . Vitamin B 12 deficiency     mild     Past Surgical History  Procedure Laterality Date  . Appendectomy    . Colonoscopy  2005    NORMAL  . Esophagogastroduodenoscopy  2005    [facility]    Current Outpatient Prescriptions  Medication Sig Dispense Refill  . Calcium Carbonate-Vitamin D (CALCIUM + D PO) Take by mouth every morning.    . citalopram (CELEXA) 20 MG tablet TAKE 1 TABLET DAILY. (PLEASE CALL [phone] FOR APPOINTMENT WITH DR PAZ) 45 tablet 11  . Coenzyme Q10 (CO Q 10  PO) Take by mouth daily.    . Mag Aspart-Potassium Aspart (POTASSIUM & MAGNESIUM ASPARTAT PO) Take by mouth every morning.    [name] MILK THISTLE PO Take by mouth daily.    . Multiple Vitamins-Minerals (MULTIVITAMIN PO) Take by mouth daily.     . Omega-3 Fatty Acids (FISH OIL PO) Take by mouth daily.    . RABEprazole (ACIPHEX) 20 MG tablet TAKE 1 TABLET DAILY 90 tablet 1  . Resveratrol 250 MG CAPS Take by mouth every morning.     No current facility-administered medications for this visit.    Family History  Problem Relation Age of Onset  . Coronary artery disease Maternal Grandfather   . Breast cancer Maternal Aunt   . Scleroderma Mother   . Emphysema Father   . Colon cancer Neg Hx     ROS:  Pertinent items are noted in HPI.  Otherwise, a comprehensive ROS was negative.  Exam:   BP 110/72 mmHg  Pulse 70  Resp 20  Ht 6' (1.829 m)  Wt 209 lb (94.802 kg)  BMI 28.34 kg/m2  LMP 07/10/1996 Height: 6' (182.9 cm) Ht Readings from Last 3 Encounters:  01/19/15 6' (1.829 m)  04/15/14 5' 11"  (1.803 m)  04/08/14 6' (1.829 m)    General appearance: alert, cooperative  and appears stated age Head: Normocephalic, without obvious abnormality, atraumatic Neck: no adenopathy, supple, symmetrical, trachea midline and thyroid normal to inspection and palpation Lungs: clear to auscultation bilaterally Breasts: normal appearance, no masses or tenderness, No nipple retraction or dimpling, No nipple discharge or bleeding, No axillary or supraclavicular adenopathy Heart: regular rate and rhythm Abdomen: soft, non-tender; no masses,  no organomegaly Extremities: extremities normal, atraumatic, no cyanosis or edema Skin: Skin color, texture, turgor normal. No rashes or lesions Lymph nodes: Cervical, supraclavicular, and axillary nodes normal. No abnormal inguinal nodes palpated Neurologic: Grossly normal   Pelvic: External genitalia:  no lesions              Urethra:  normal appearing urethra with  no masses, tenderness or lesions              Bartholin's and Skene's: normal                 Vagina: normal appearing vagina with normal color and discharge, no lesions              Cervix: normal,non tender, no lesions              Pap taken: No. Bimanual Exam:  Uterus:  normal size, contour, position, consistency, mobility, non-tender              Adnexa: normal adnexa and no mass, fullness, tenderness               Rectovaginal: Confirms               Anus:  normal sphincter tone, no lesions  Chaperone present: Yes  A:  Well Woman with normal exam  Menopausal no HRT  Anxiety on stable medication with PCP management  BMD due, plans to have with mammogram  P:   Reviewed health and wellness pertinent to exam  Aware of need to advise if vaginal bleeding  Continue MD follow up as indicated  Pap smear not taken today   counseled on breast self exam, mammography screening, adequate intake of calcium and vitamin D, diet and exercise  return annually or prn  An After Visit Summary was printed and given to the patient.

## 2015-01-19 NOTE — Patient Instructions (Signed)

## 2015-01-19 NOTE — Progress Notes (Signed)
Reviewed personally.  M. Suzanne Zadia Uhde, MD.  

## 2015-04-13 ENCOUNTER — Encounter: Payer: Self-pay | Admitting: Internal Medicine

## 2015-04-13 ENCOUNTER — Ambulatory Visit (INDEPENDENT_AMBULATORY_CARE_PROVIDER_SITE_OTHER): Payer: Medicare HMO | Admitting: Internal Medicine

## 2015-04-13 VITALS — BP 120/74 | HR 58 | Temp 98.0°F | Ht 72.0 in | Wt 210.2 lb

## 2015-04-13 DIAGNOSIS — Z23 Encounter for immunization: Secondary | ICD-10-CM

## 2015-04-13 DIAGNOSIS — E042 Nontoxic multinodular goiter: Secondary | ICD-10-CM

## 2015-04-13 DIAGNOSIS — Z0000 Encounter for general adult medical examination without abnormal findings: Secondary | ICD-10-CM

## 2015-04-13 DIAGNOSIS — E785 Hyperlipidemia, unspecified: Secondary | ICD-10-CM

## 2015-04-13 DIAGNOSIS — Z09 Encounter for follow-up examination after completed treatment for conditions other than malignant neoplasm: Secondary | ICD-10-CM | POA: Insufficient documentation

## 2015-04-13 DIAGNOSIS — E538 Deficiency of other specified B group vitamins: Secondary | ICD-10-CM

## 2015-04-13 DIAGNOSIS — E049 Nontoxic goiter, unspecified: Secondary | ICD-10-CM | POA: Diagnosis not present

## 2015-04-13 LAB — LIPID PANEL
Cholesterol: 180 mg/dL (ref 0–200)
HDL: 38.4 mg/dL — ABNORMAL LOW (ref >39.00)
LDL Cholesterol: 112 mg/dL — ABNORMAL HIGH (ref 0–99)
NONHDL: 141.93
TRIGLYCERIDES: 152 mg/dL — AB (ref 0.0–149.0)
Total CHOL/HDL Ratio: 5
VLDL: 30.4 mg/dL (ref 0.0–40.0)

## 2015-04-13 LAB — T4, FREE: FREE T4: 0.67 ng/dL (ref 0.60–1.60)

## 2015-04-13 LAB — TSH: TSH: 3.3 u[IU]/mL (ref 0.35–4.50)

## 2015-04-13 LAB — T3, FREE: T3 FREE: 2.7 pg/mL (ref 2.3–4.2)

## 2015-04-13 LAB — VITAMIN B12: Vitamin B-12: 193 pg/mL — ABNORMAL LOW (ref 211–911)

## 2015-04-13 MED ORDER — CITALOPRAM HYDROBROMIDE 20 MG PO TABS: 20.0000 mg | ORAL_TABLET | Freq: Every day | ORAL | Status: DC

## 2015-04-13 MED ORDER — CITALOPRAM HYDROBROMIDE 20 MG PO TABS
20.0000 mg | ORAL_TABLET | Freq: Every day | ORAL | Status: DC
Start: 2015-04-13 — End: 2016-08-07

## 2015-04-13 NOTE — Assessment & Plan Note (Signed)
Anxiety: Refill of citalopram as needed Mild dyslipidemia: diet and exercise discussed, check FLP Goiter: Was rec a FNA based on last [place] --> declined. Risks and benefits of a FNA discuss, she eventually agreed to proceed. States that this will be the last one she will ever do thus we may not need to continue doing ultrasounds. Last TSH is slightly elevated, check TFTs Vitamins deficiencies: Last vitamin D satisfactory, check a B12 level RTC 1 year

## 2015-04-13 NOTE — Patient Instructions (Signed)
Get your blood work before you leave   Next visit in 1 year for a physical exam, fasting. Please make an appointment   Please consider visit these websites for more information:  [email or web address]  [email or web address]    Fall Prevention and Home Safety Falls cause injuries and can affect all age groups. It is possible to use preventive measures to significantly decrease the likelihood of falls. There are many simple measures which can make your home safer and prevent falls. OUTDOORS  Repair cracks and edges of walkways and driveways.  Remove high doorway thresholds.  Trim shrubbery on the main path into your home.  Have good outside lighting.  Clear walkways of tools, rocks, debris, and clutter.  Check that handrails are not broken and are securely fastened. Both sides of steps should have handrails.  Have leaves, snow, and ice cleared regularly.  Use sand or salt on walkways during winter months.  In the garage, clean up grease or oil spills. BATHROOM  Install night lights.  Install grab bars by the toilet and in the tub and shower.  Use non-skid mats or decals in the tub or shower.  Place a plastic non-slip stool in the shower to sit on, if needed.  Keep floors dry and clean up all water on the floor immediately.  Remove soap buildup in the tub or shower on a regular basis.  Secure bath mats with non-slip, double-sided rug tape.  Remove throw rugs and tripping hazards from the floors. BEDROOMS  Install night lights.  Make sure a bedside light is easy to reach.  Do not use oversized bedding.  Keep a telephone by your bedside.  Have a firm chair with side arms to use for getting dressed.  Remove throw rugs and tripping hazards from the floor. KITCHEN  Keep handles on pots and pans turned toward the center of the stove. Use back burners when possible.  Clean up spills quickly and allow time for drying.  Avoid walking on wet  floors.  Avoid hot utensils and knives.  Position shelves so they are not too high or low.  Place commonly used objects within easy reach.  If necessary, use a sturdy step stool with a grab bar when reaching.  Keep electrical cables out of the way.  Do not use floor polish or wax that makes floors slippery. If you must use wax, use non-skid floor wax.  Remove throw rugs and tripping hazards from the floor. STAIRWAYS  Never leave objects on stairs.  Place handrails on both sides of stairways and use them. Fix any loose handrails. Make sure handrails on both sides of the stairways are as long as the stairs.  Check carpeting to make sure it is firmly attached along stairs. Make repairs to worn or loose carpet promptly.  Avoid placing throw rugs at the top or bottom of stairways, or properly secure the rug with carpet tape to prevent slippage. Get rid of throw rugs, if possible.  Have an electrician put in a light switch at the top and bottom of the stairs. OTHER FALL PREVENTION TIPS  Wear low-heel or rubber-soled shoes that are supportive and fit well. Wear closed toe shoes.  When using a stepladder, make sure it is fully opened and both spreaders are firmly locked. Do not climb a closed stepladder.  Add color or contrast paint or tape to grab bars and handrails in your home. Place contrasting color strips on first and last steps.  Learn and use mobility aids as  needed. Install an electrical emergency response system.  Turn on lights to avoid dark areas. Replace light bulbs that burn out immediately. Get light switches that glow.  Arrange furniture to create clear pathways. Keep furniture in the same place.  Firmly attach carpet with non-skid or double-sided tape.  Eliminate uneven floor surfaces.  Select a carpet pattern that does not visually hide the edge of steps.  Be aware of all pets. OTHER HOME SAFETY TIPS  Set the water temperature for 120 F (48.8 C).  Keep  emergency numbers on or near the telephone.  Keep smoke detectors on every level of the home and near sleeping areas. Document Released: 06/16/2002 Document Revised: 12/26/2011 Document Reviewed: 09/15/2011 [facility] Patient Information 2015 [place], [place]. This information is not intended to replace advice given to you by your health care provider. Make sure you discuss any questions you have with your health care provider.   Preventive Care for Adults Ages 29 and over  Blood pressure check.** / Every 1 to 2 years.  Lipid and cholesterol check.**/ Every 5 years beginning at age 2.  Lung cancer screening. / Every year if you are aged 35-80 years and have a 30-pack-year history of smoking and currently smoke or have quit within the past 15 years. Yearly screening is stopped once you have quit smoking for at least 15 years or develop a health problem that would prevent you from having lung cancer treatment.  Fecal occult blood test (FOBT) of stool. / Every year beginning at age 45 and continuing until age 66. You may not have to do this test if you get a colonoscopy every 10 years.  Flexible sigmoidoscopy** or colonoscopy.** / Every 5 years for a flexible sigmoidoscopy or every 10 years for a colonoscopy beginning at age 9 and continuing until age 31.  Hepatitis C blood test.** / For all people born from [birth year] through 1967 and any individual with known risks for hepatitis C.  Abdominal aortic aneurysm (AAA) screening.** / A one-time screening for ages 67 to 80 years who are current or former smokers.  Skin self-exam. / Monthly.  Influenza vaccine. / Every year.  Tetanus, diphtheria, and acellular pertussis (Tdap/Td) vaccine.** / 1 dose of Td every 10 years.  Varicella vaccine.** / Consult your health care provider.  Zoster vaccine.** / 1 dose for adults aged 51 years or older.  Pneumococcal 13-valent conjugate (PCV13) vaccine.** / Consult your health care provider.  Pneumococcal  polysaccharide (PPSV23) vaccine.** / 1 dose for all adults aged 77 years and older.  Meningococcal vaccine.** / Consult your health care provider.  Hepatitis A vaccine.** / Consult your health care provider.  Hepatitis B vaccine.** / Consult your health care provider.  Haemophilus influenzae type b (Hib) vaccine.** / Consult your health care provider. **Family history and personal history of risk and conditions may change your health care provider's recommendations. Document Released: 08/22/2001 Document Revised: 07/01/2013 Document Reviewed: 11/21/2010 [facility] Patient Information 2015 [place], [place]. This information is not intended to replace advice given to you by your health care provider. Make sure you discuss any questions you have with your health care provider.

## 2015-04-13 NOTE — Progress Notes (Signed)
Subjective:    Patient ID: Emily Parrish, female    DOB: Mar 06, 1946, 69 y.o.   MRN: [id number]  DOS:  04/13/2015 Type of visit - description :  Here for Medicare AWV:  1. Risk factors based on Past M, S, F history: reviewed 2. Physical Activities:  Walks regularly, active in general 3. Depression/mood: neg screening   4. Hearing:  HOH, saw audiology, was rx aids, not using   5. ADL's:  Independent   6. Fall Risk: neg screen, see AVS 7. home Safety: does feel safe at home   8. Height, weight, & visual acuity: see VS, ~ the same  , uses reading glasses, rec to see eye doctor  9. Counseling: provided 10. Labs ordered based on risk factors: if needed   11. Referral Coordination: if needed 12. Care Plan, see assessment and plan   13. Cognitive Assessment: motor skills and cognition appropriate  14. Providers list updated  15. End of life care discussed, see AVS  In addition, today we discussed the following: Anxiety: Well controlled on citalopram GERD: Essentially asymptomatic, on PPIs   Review of Systems Constitutional: No fever. No chills. No unexplained wt changes. No unusual sweats  HEENT: No dental problems, no ear discharge, no facial swelling, no voice changes. No eye discharge, no eye  redness , no  intolerance to light   Respiratory: No wheezing , no  difficulty breathing. No cough , no mucus production  Cardiovascular: No CP, no leg swelling , no  Palpitations  GI: no nausea, no vomiting, no diarrhea , no  abdominal pain.  No blood in the stools. No dysphagia, no odynophagia    Endocrine: No polyphagia, no polyuria , no polydipsia  GU: No dysuria, gross hematuria, difficulty urinating. No urinary urgency, no frequency.  Musculoskeletal: No joint swellings or unusual aches or pains  Skin: No change in the color of the skin, palor , no  Rash  Allergic, immunologic: No environmental allergies , no  food allergies  Neurological: No dizziness no  syncope. No  headaches. No diplopia, no slurred, no slurred speech, no motor deficits, no facial  Numbness  Hematological: No enlarged lymph nodes, no easy bruising , no unusual bleedings  Psychiatry: No suicidal ideas, no hallucinations, no beavior problems, no confusion.  No unusual/severe anxiety, no depression   Past Medical History  Diagnosis Date  . Vitamin D deficiency   . Goiter   . Anxiety   . Esophageal reflux   . Hiatal hernia   . Fatty liver      NASH--s/p liver Bx 2011, early portal fibrosis   . OSA (obstructive sleep apnea) 12/07/2010    Apnealink 12/01/10>>AHI 9, RDI 10, SpO2 low 82%.   [name] Urethral stricture     uretheral stricture , s/p dilatation per urology ~ 2005  . Vitamin B 12 deficiency     mild     Past Surgical History  Procedure Laterality Date  . Appendectomy    . Colonoscopy  2005    NORMAL  . Esophagogastroduodenoscopy  2005    [place]    Social History   Social History  . Marital Status: Divorced    Spouse Name: N/A  . Number of Children: 0  . Years of Education: N/A   Occupational History  . 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 Smoking status: Former Smoker -- 10 years    Types: Cigarettes    Quit date: 07/11/1971  .  Smokeless tobacco: Never Used     Comment: used to smoke--1 1/2 pack per week  . Alcohol Use: 1.8 - 3.0 oz/week    3-5 Glasses of wine per week  . Drug Use: No  . Sexual Activity: No   Other Topics Concern  . Not on file   Social History Narrative   Lives by herself , no family in town   emergency contact Graceann Congress [phone])     Family History  Problem Relation Age of Onset  . Coronary artery disease Maternal Grandfather   . Breast cancer Maternal Aunt   . Scleroderma Mother   . Emphysema Father   . Colon cancer Neg Hx        Medication List       This list is accurate as of: 04/13/15  5:55 PM.  Always use your most recent med list.               CALCIUM & MAGNESIUM  CARBONATES PO  Take 1,000 mg by mouth daily.     citalopram 20 MG tablet  Commonly known as:  CELEXA  Take 1 tablet (20 mg total) by mouth daily.     CO Q 10 PO  Take 200 mg by mouth daily.     DHA COMPLETE PO  Take 600 mg by mouth daily.     FISH OIL PO  Take 1,500 mg by mouth daily.     MILK THISTLE PO  Take 400 mg by mouth daily.     MULTIVITAMIN PO  Take by mouth daily.     POTASSIUM & MAGNESIUM ASPARTAT PO  Take by mouth every morning.     potassium gluconate 595 MG Tabs tablet  Take 595 mg by mouth daily.     RABEprazole 20 MG tablet  Commonly known as:  ACIPHEX  TAKE 1 TABLET DAILY     Resveratrol 250 MG Caps  Take 500 mg by mouth every morning.     Turmeric Curcumin 500 MG Caps  Take 500 mg by mouth daily.           Objective:   Physical Exam BP 120/74 mmHg  Pulse 58  Temp(Src) 98 F (36.7 C) (Oral)  Ht 6' (1.829 m)  Wt 210 lb 4 oz (95.369 kg)  BMI 28.51 kg/m2  SpO2 97%  LMP 07/10/1996 General:   Well developed, well nourished . NAD.  HEENT:  Normocephalic . Face symmetric, atraumatic Neck: + Thyromegaly, nontender Lungs:  CTA B Normal respiratory effort, no intercostal retractions, no accessory muscle use. Heart: RRR,  no murmur.  No pretibial edema bilaterally  Skin: Not pale. Not jaundice Neurologic:  alert & oriented X3.  Speech normal, gait appropriate for age and unassisted Psych--  Cognition and judgment appear intact.  Cooperative with normal attention span and concentration.  Behavior appropriate. No anxious or depressed appearing.      Assessment & Plan:   Assessment > Anxiety Mild dyslipidemia Goiter, had one (-) FNA before, last [place] 04-2014, Rx FNA  GI: --GERD, HH --Fatty liver---NASH--s/p liver Bx 2011, early portal fibrosis  OSA ----Apnealink 12/01/10>>AHI 9, RDI 10, SpO2 low 82%.  H/o urethral stricture-- s/p dilatation, urology B12 and Vit D deficiency  Plan  Anxiety: Refill of citalopram as needed Mild  dyslipidemia: diet and exercise discussed, check FLP Goiter: Was rec a FNA based on last [place] --> declined. Risks and benefits of a FNA discuss, she eventually agreed to proceed. States that this will be the  last one she will ever do thus we may not need to continue doing ultrasounds. Last TSH is slightly elevated, check TFTs Vitamins deficiencies: Last vitamin D satisfactory, check a B12 level RTC 1 year

## 2015-04-13 NOTE — Progress Notes (Signed)
Pre visit review using our clinic review tool, if applicable. No additional management support is needed unless otherwise documented below in the visit note. 

## 2015-04-13 NOTE — Assessment & Plan Note (Addendum)
Td 08; pneumonia shot 12-09; prevnar -- 2015; zostavax 2009; Flu shot -- today Last gyn visit 01-2015 PAPs and MMG per gyn DEXA per gyn Cscope 2005 and 04-2015 neg, 10 years  Diet- needs improvement, counseled  exercise -- doing well  Palpable Ao --- 04-2014 u/s no AAA Labs

## 2015-04-29 DIAGNOSIS — R69 Illness, unspecified: Secondary | ICD-10-CM | POA: Diagnosis not present

## 2015-05-03 ENCOUNTER — Telehealth: Payer: Self-pay | Admitting: Certified Nurse Midwife

## 2015-05-03 NOTE — Telephone Encounter (Signed)
Signed order for BMD faxed with cover sheet and confirmation to [facility]. Will close encounter.

## 2015-05-03 NOTE — Telephone Encounter (Signed)
[facility] calling to get an order for patient's bone density scan.

## 2015-05-03 NOTE — Telephone Encounter (Signed)
Order is signed for BMD.

## 2015-05-03 NOTE — Telephone Encounter (Signed)
Order for BMD to [place], FNP as Melvia Heaps CNM is out of the office today.

## 2015-05-04 ENCOUNTER — Ambulatory Visit
Admission: RE | Admit: 2015-05-04 | Discharge: 2015-05-04 | Disposition: A | Discharge status: Home / Self Care | Payer: Medicare HMO | Source: Ambulatory Visit | Attending: Internal Medicine | Admitting: Internal Medicine

## 2015-05-04 ENCOUNTER — Other Ambulatory Visit (HOSPITAL_COMMUNITY)
Admission: RE | Admit: 2015-05-04 | Discharge: 2015-05-04 | Disposition: A | Discharge status: Home / Self Care | Payer: Medicare HMO | Source: Ambulatory Visit | Attending: Physician Assistant | Admitting: Physician Assistant

## 2015-05-04 DIAGNOSIS — E042 Nontoxic multinodular goiter: Secondary | ICD-10-CM

## 2015-05-04 DIAGNOSIS — E041 Nontoxic single thyroid nodule: Secondary | ICD-10-CM | POA: Diagnosis not present

## 2015-05-04 NOTE — Procedures (Signed)
Using direct ultrasound guidance, 4 passes were made using needles into the nodule within the left lobe of the thyroid.   Ultrasound was used to confirm needle placements on all occasions.   Specimens were sent to Pathology for analysis.   Amritpal Shropshire S Keller Mikels PA-C 05/04/2015 1:43 PM

## 2015-05-11 ENCOUNTER — Telehealth: Payer: Self-pay | Admitting: Internal Medicine

## 2015-05-12 MED ORDER — RABEPRAZOLE SODIUM 20 MG PO TBEC: 20.0000 mg | DELAYED_RELEASE_TABLET | Freq: Every day | ORAL | Status: DC

## 2015-05-12 NOTE — Telephone Encounter (Signed)
Aciphex sent to [facility]

## 2015-05-19 ENCOUNTER — Telehealth: Payer: Self-pay | Admitting: Internal Medicine

## 2015-05-20 NOTE — Telephone Encounter (Signed)
Holland Falling has approved patient's Aciphex ( approval [id number]).  The delivery pharmacy has contacted patient to obtain method of payment so the can ship the medication.  I will call her later to see if they were able to get in touch.

## 2015-05-20 NOTE — Telephone Encounter (Signed)
Aciphex approved

## 2015-05-21 DIAGNOSIS — M8588 Other specified disorders of bone density and structure, other site: Secondary | ICD-10-CM | POA: Diagnosis not present

## 2015-05-21 DIAGNOSIS — Z1231 Encounter for screening mammogram for malignant neoplasm of breast: Secondary | ICD-10-CM | POA: Diagnosis not present

## 2015-05-28 ENCOUNTER — Other Ambulatory Visit: Payer: Self-pay | Admitting: Internal Medicine

## 2015-06-21 DIAGNOSIS — G4733 Obstructive sleep apnea (adult) (pediatric): Secondary | ICD-10-CM | POA: Diagnosis not present

## 2015-07-13 DIAGNOSIS — H04123 Dry eye syndrome of bilateral lacrimal glands: Secondary | ICD-10-CM | POA: Diagnosis not present

## 2015-07-13 DIAGNOSIS — H2513 Age-related nuclear cataract, bilateral: Secondary | ICD-10-CM | POA: Diagnosis not present

## 2015-09-17 DIAGNOSIS — R0989 Other specified symptoms and signs involving the circulatory and respiratory systems: Secondary | ICD-10-CM | POA: Diagnosis not present

## 2015-09-17 DIAGNOSIS — R05 Cough: Secondary | ICD-10-CM | POA: Diagnosis not present

## 2015-09-17 DIAGNOSIS — J40 Bronchitis, not specified as acute or chronic: Secondary | ICD-10-CM | POA: Diagnosis not present

## 2015-11-03 DIAGNOSIS — R69 Illness, unspecified: Secondary | ICD-10-CM | POA: Diagnosis not present

## 2015-11-03 DIAGNOSIS — Z0000 Encounter for general adult medical examination without abnormal findings: Secondary | ICD-10-CM | POA: Diagnosis not present

## 2015-11-03 DIAGNOSIS — K219 Gastro-esophageal reflux disease without esophagitis: Secondary | ICD-10-CM | POA: Diagnosis not present

## 2015-11-05 ENCOUNTER — Telehealth: Payer: Self-pay | Admitting: Internal Medicine

## 2015-11-12 NOTE — Telephone Encounter (Signed)
Patient calling in regarding this.  °

## 2015-11-12 NOTE — Telephone Encounter (Signed)
Prior authorization form for Aciphex being faxed.  I will complete them and fax them back.

## 2015-11-16 ENCOUNTER — Telehealth: Payer: Self-pay

## 2015-11-16 ENCOUNTER — Telehealth: Payer: Self-pay | Admitting: Internal Medicine

## 2015-11-16 MED ORDER — RABEPRAZOLE SODIUM 20 MG PO TBEC: 20.0000 mg | DELAYED_RELEASE_TABLET | Freq: Every day | ORAL | Status: DC

## 2015-11-16 NOTE — Telephone Encounter (Signed)
Per Holland Falling, Aciphex did not need prior authorization - it is not on her formulary at all but [facility] will deliver a shipment at a reduced cost.  Sent a new rx for Aciphex to [facility].

## 2015-11-18 MED ORDER — RABEPRAZOLE SODIUM 20 MG PO TBEC: 20.0000 mg | DELAYED_RELEASE_TABLET | Freq: Every day | ORAL | Status: DC

## 2015-11-18 NOTE — Telephone Encounter (Signed)
Spoke with patient who said that the medication now needs to go to [facility].  I sent a refill to [facility].  Patient will call me if there are any problems.

## 2016-01-08 ENCOUNTER — Telehealth: Payer: Self-pay

## 2016-01-08 NOTE — Telephone Encounter (Signed)
Patient is on the list for [employer] 2017 and may be a good candidate for an AWV in 2017. Please let me know if/when appt is scheduled.   

## 2016-01-24 NOTE — Telephone Encounter (Signed)
Patient scheduled for 04/27/2016 for AWV

## 2016-01-27 ENCOUNTER — Encounter: Payer: Self-pay | Admitting: Certified Nurse Midwife

## 2016-01-27 ENCOUNTER — Ambulatory Visit (INDEPENDENT_AMBULATORY_CARE_PROVIDER_SITE_OTHER): Payer: Medicare HMO | Admitting: Certified Nurse Midwife

## 2016-01-27 VITALS — BP 106/68 | HR 70 | Resp 16 | Ht 71.75 in | Wt 213.0 lb

## 2016-01-27 DIAGNOSIS — Z124 Encounter for screening for malignant neoplasm of cervix: Secondary | ICD-10-CM | POA: Diagnosis not present

## 2016-01-27 DIAGNOSIS — Z0000 Encounter for general adult medical examination without abnormal findings: Secondary | ICD-10-CM | POA: Diagnosis not present

## 2016-01-27 DIAGNOSIS — Z01419 Encounter for gynecological examination (general) (routine) without abnormal findings: Secondary | ICD-10-CM

## 2016-01-27 LAB — HIV ANTIBODY (ROUTINE TESTING W REFLEX): HIV: NONREACTIVE

## 2016-01-27 NOTE — Patient Instructions (Signed)

## 2016-01-27 NOTE — Progress Notes (Signed)
70 y.o. G0P0000 Divorced  Caucasian Fe here for annual exam. Menopausal no vaginal bleeding or vaginal dryness. Not sexually active. 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 Sees PCP yearly for labs and anxiety management. All labs stable. Brother recently had TIA, but no effects. No health concerns today. Enjoying her garden!  Patient's last menstrual period was 07/10/1996.          Sexually active: No.  The current method of family planning is post menopausal status.    Exercising: No.  exercise Smoker:  no  Health Maintenance: Pap: 01-13-14 neg MMG:  05-21-15 category c density birads 1:neg Colonoscopy:  2015 normal BMD:   2016 normal TDaP:  2008 Shingles: 2008 Pneumonia: 2015 Hep C and HIV: not sure if done Labs: pcp Self breast exam: not done   reports that she quit smoking about 44 years ago. Her smoking use included Cigarettes. She quit after 10 years of use. She has never used smokeless tobacco. She reports that she drinks about 1.8 - 3.0 oz of alcohol per week. She reports that she does not use illicit drugs.  Past Medical History  Diagnosis Date  . Vitamin D deficiency   . Goiter   . Anxiety   . Esophageal reflux   . Hiatal hernia   . Fatty liver      NASH--s/p liver Bx 2011, early portal fibrosis   . OSA (obstructive sleep apnea) 12/07/2010    Apnealink 12/01/10>>AHI 9, RDI 10, SpO2 low 82%.   [name] Urethral stricture     uretheral stricture , s/p dilatation per urology ~ 2005  . Vitamin B 12 deficiency     mild     Past Surgical History  Procedure Laterality Date  . Appendectomy    . Colonoscopy  2005    NORMAL  . Esophagogastroduodenoscopy  2005    HH  . Biopsy thyroid  2016    Consistent w/ benign follicular nodule    Current Outpatient Prescriptions  Medication Sig Dispense Refill  . citalopram (CELEXA) 20 MG tablet Take 1 tablet (20 mg total) by mouth daily. 30 tablet 0  . MILK THISTLE PO Take 400 mg by mouth daily.     . Multiple Vitamins-Minerals  (MULTIVITAMIN PO) Take by mouth daily.     . RABEprazole (ACIPHEX) 20 MG tablet Take 1 tablet (20 mg total) by mouth daily. 90 tablet 2   No current facility-administered medications for this visit.    Family History  Problem Relation Age of Onset  . Coronary artery disease Maternal Grandfather   . Breast cancer Maternal Aunt   . Scleroderma Mother   . Emphysema Father   . Colon cancer Neg Hx     ROS:  Pertinent items are noted in HPI.  Otherwise, a comprehensive ROS was negative.  Exam:   BP 106/68 mmHg  Pulse 70  Resp 16  Ht 5' 11.75" (1.822 m)  Wt 213 lb (96.616 kg)  BMI 29.10 kg/m2  LMP 07/10/1996 Height: 5' 11.75" (182.2 cm) Ht Readings from Last 3 Encounters:  01/27/16 5' 11.75" (1.822 m)  04/13/15 6' (1.829 m)  01/19/15 6' (1.829 m)    General appearance: alert, cooperative and appears stated age Head: Normocephalic, without obvious abnormality, atraumatic Neck: no adenopathy, supple, symmetrical, trachea midline and thyroid normal to inspection and palpation Lungs: clear to auscultation bilaterally Breasts: normal appearance, no masses or tenderness, No nipple retraction or dimpling, No nipple discharge or bleeding, No axillary or supraclavicular adenopathy Heart: regular rate and  rhythm Abdomen: soft, non-tender; no masses,  no organomegaly Extremities: extremities normal, atraumatic, no cyanosis or edema Skin: Skin color, texture, turgor normal. No rashes or lesions Lymph nodes: Cervical, supraclavicular, and axillary nodes normal. No abnormal inguinal nodes palpated Neurologic: Grossly normal   Pelvic: External genitalia:  no lesions              Urethra:  normal appearing urethra with no masses, tenderness or lesions              Bartholin's and Skene's: normal                 Vagina: normal appearing vagina with normal color and discharge, no lesions              Cervix: no cervical motion tenderness, no lesions and nulliparous appearance               Pap taken: Yes.   Bimanual Exam:  Uterus:  normal size, contour, position, consistency, mobility, non-tender              Adnexa: normal adnexa and no mass, fullness, tenderness               Rectovaginal: Confirms               Anus:  normal sphincter tone, no lesions  Chaperone present: yes  A:  Well Woman with normal exam  Menopausal no HRT  Anxiety with PCP management  Screening labs  P:   Reviewed health and wellness pertinent to exam  Aware of need to evaluate if vaginal bleeding  Continue follow up with PCP as indicated  Lab: Hep C, HIV  Pap smear as above   counseled on breast self exam, mammography screening, adequate intake of calcium and vitamin D, diet and exercise, Kegel's exercises  return annually or prn  An After Visit Summary was printed and given to the patient.

## 2016-01-27 NOTE — Progress Notes (Signed)
Encounter reviewed Fredis Malkiewicz, MD   

## 2016-01-28 LAB — HEPATITIS C ANTIBODY: HCV Ab: NEGATIVE

## 2016-01-28 LAB — IPS PAP SMEAR ONLY

## 2016-02-02 DIAGNOSIS — G4733 Obstructive sleep apnea (adult) (pediatric): Secondary | ICD-10-CM | POA: Diagnosis not present

## 2016-04-11 DIAGNOSIS — N390 Urinary tract infection, site not specified: Secondary | ICD-10-CM | POA: Diagnosis not present

## 2016-04-11 DIAGNOSIS — N358 Other urethral stricture: Secondary | ICD-10-CM | POA: Diagnosis not present

## 2016-04-27 ENCOUNTER — Ambulatory Visit (INDEPENDENT_AMBULATORY_CARE_PROVIDER_SITE_OTHER): Payer: Medicare HMO | Admitting: *Deleted

## 2016-04-27 ENCOUNTER — Ambulatory Visit: Payer: Medicare HMO | Admitting: *Deleted

## 2016-04-27 ENCOUNTER — Encounter: Payer: Self-pay | Admitting: *Deleted

## 2016-04-27 VITALS — BP 118/72 | HR 72 | Resp 16 | Ht 71.5 in | Wt 218.0 lb

## 2016-04-27 DIAGNOSIS — Z23 Encounter for immunization: Secondary | ICD-10-CM

## 2016-04-27 DIAGNOSIS — G4733 Obstructive sleep apnea (adult) (pediatric): Secondary | ICD-10-CM | POA: Diagnosis not present

## 2016-04-27 DIAGNOSIS — Z0000 Encounter for general adult medical examination without abnormal findings: Secondary | ICD-10-CM

## 2016-04-27 NOTE — Assessment & Plan Note (Signed)
On CPAP, pt endorses good compliance. No issues reported. Previously followed by Dr. Halford Chessman, but has not seen him since 2013 per chart review.

## 2016-04-27 NOTE — Patient Instructions (Signed)
Follow-up with Dr. Larose Kells as scheduled.   Bring a copy of your advanced directives to your next office visit.  Fatigue Fatigue is feeling tired all of the time, a lack of energy, or a lack of motivation. Occasional or mild fatigue is often a normal response to activity or life in general. However, long-lasting (chronic) or extreme fatigue may indicate an underlying medical condition. HOME CARE INSTRUCTIONS  Watch your fatigue for any changes. The following actions may help to lessen any discomfort you are feeling:  Talk to your health care provider about how much sleep you need each night. Try to get the required amount every night.  Take medicines only as directed by your health care provider.  Eat a healthy and nutritious diet. Ask your health care provider if you need help changing your diet.  Drink enough fluid to keep your urine clear or pale yellow.  Practice ways of relaxing, such as yoga, meditation, massage therapy, or acupuncture.  Exercise regularly.   Change situations that cause you stress. Try to keep your work and personal routine reasonable.  Do not abuse illegal drugs.  Limit alcohol intake to no more than 1 drink per day for nonpregnant women and 2 drinks per day for men. One drink equals 12 ounces of beer, 5 ounces of wine, or 1 ounces of hard liquor.  Take a multivitamin, if directed by your health care provider. SEEK MEDICAL CARE IF:   Your fatigue does not get better.  You have a fever.   You have unintentional weight loss or gain.  You have headaches.   You have difficulty:   Falling asleep.  Sleeping throughout the night.  You feel angry, guilty, anxious, or sad.   You are unable to have a bowel movement (constipation).   You skin is dry.   Your legs or another part of your body is swollen.  SEEK IMMEDIATE MEDICAL CARE IF:   You feel confused.   Your vision is blurry.  You feel faint or pass out.   You have a severe  headache.   You have severe abdominal, pelvic, or back pain.   You have chest pain, shortness of breath, or an irregular or fast heartbeat.   You are unable to urinate or you urinate less than normal.   You develop abnormal bleeding, such as bleeding from the rectum, vagina, nose, lungs, or nipples.  You vomit blood.   You have thoughts about harming yourself or committing suicide.   You are worried that you might harm someone else.    This information is not intended to replace advice given to you by your health care provider. Make sure you discuss any questions you have with your health care provider.   Document Released: 04/23/2007 Document Revised: 07/17/2014 Document Reviewed: 10/28/2013 Elsevier Interactive Patient Education [employer].

## 2016-04-27 NOTE — Assessment & Plan Note (Signed)
Pt reports feeling more tired and having less energy over the last several months. She notes more frequent day time naps. No other changes to lifestyle, medications, or sleep patterns reported. Negative depression screening today. Pt to follow-up w/ PCP for CPE next month, declined sooner appt.

## 2016-04-27 NOTE — Progress Notes (Addendum)
Subjective:   Emily Parrish is a 70 y.o. female who presents for Medicare Annual (Subsequent) preventive examination.  Review of Systems:  No ROS.  Medicare Wellness Visit.    Sleep patterns: Wears CPAP at night. Wakes up several times during the night. Goes to bed 10:30-11pm, gets up 7:30-8am. Feels tired upon waking. Occasionally gets up to void and is able to go back to sleep.    Home Safety/Smoke Alarms: Lives at home alone. Feels safe in home. No issues navigating stairs or getting in/out of the shower. Smoke alarms present.   Living environment; residence and Firearm Safety: No firearms.  Seat Belt Safety/Bike Helmet: Wears seat belt.   Counseling:   Eye Exam- Wears reading glasses. Follows w/ Dr. Katy Fitch about every 2 years. No issues reported.   Female:   Pap- last 01/27/16 w/ Melvia Heaps, CNM. Negative for Intraepithelial Lesions or Malignancy.      Mammo- 05/21/15 BI-RADS Category 1: Negative       Dexa scan- 05/21/15 Normal       CCS- last 04/15/14 w/ Dr. Scarlette Shorts. Normal colonoscopy, 10 year recall.       Objective:     Vitals: BP 118/72 (BP Location: Right Arm, Patient Position: Sitting, Cuff Size: Normal)   Pulse 72   Resp 16   Ht 5' 11.5" (1.816 m)   Wt 218 lb (98.9 kg)   LMP 07/10/1996   SpO2 98%   BMI 29.98 kg/m   Body mass index is 29.98 kg/m.   Tobacco History  Smoking Status  . Former Smoker  . Years: 10.00  . Types: Cigarettes  . Quit date: 07/11/1971  Smokeless Tobacco  . Never Used    Comment: used to smoke--1 1/2 pack per week     Counseling given: Not Answered   Past Medical History:  Diagnosis Date  . Anxiety   . Esophageal reflux   . Fatty liver     NASH--s/p liver Bx 2011, early portal fibrosis   . Goiter   . Hiatal hernia   . OSA (obstructive sleep apnea) 12/07/2010   Apnealink 12/01/10>>AHI 9, RDI 10, SpO2 low 82%.   [name] Urethral stricture    uretheral stricture , s/p dilatation per urology ~ 2005  . Vitamin B 12  deficiency    mild   . Vitamin D deficiency    Past Surgical History:  Procedure Laterality Date  . APPENDECTOMY    . BIOPSY THYROID  2016   Consistent w/ benign follicular nodule  . COLONOSCOPY  2005   NORMAL  . ESOPHAGOGASTRODUODENOSCOPY  2005   HH   Family History  Problem Relation Age of Onset  . Coronary artery disease Maternal Grandfather   . Breast cancer Maternal Aunt   . Scleroderma Mother   . Emphysema Father   . Stroke Brother   . Colon cancer Neg Hx    History  Sexual Activity  . Sexual activity: No    Outpatient Encounter Prescriptions as of 04/27/2016  Medication Sig  . citalopram (CELEXA) 20 MG tablet Take 1 tablet (20 mg total) by mouth daily.  [name] MILK THISTLE PO Take 400 mg by mouth daily.   . Multiple Vitamins-Minerals (MULTIVITAMIN PO) Take by mouth daily.   . RABEprazole (ACIPHEX) 20 MG tablet Take 1 tablet (20 mg total) by mouth daily.   No facility-administered encounter medications on file as of 04/27/2016.     Activities of Daily Living In your present state of health, do you  have any difficulty performing the following activities: 04/27/2016  Hearing? N  Vision? N  Difficulty concentrating or making decisions? N  Walking or climbing stairs? N  Dressing or bathing? N  Doing errands, shopping? N  Preparing Food and eating ? N  Using the Toilet? N  In the past six months, have you accidently leaked urine? Y  Do you have problems with loss of bowel control? N  Managing your Medications? N  Managing your Finances? N  Housekeeping or managing your Housekeeping? N  Some recent data might be hidden    Patient Care Team: Colon Branch, MD as PCP - General Irine Seal, MD as Consulting Physician (Urology) Regina Eck, CNM as Consulting Physician (Certified Nurse Midwife) Clent Jacks, MD as Consulting Physician (Ophthalmology)    Assessment:    Physical assessment deferred to PCP.  Exercise Activities and Dietary  recommendations Current Exercise Habits: The patient does not participate in regular exercise at present  Diet (meal preparation, eat out, water intake, caffeinated beverages, dairy products, fruits and vegetables): in general, a "healthy" diet  , on average, 2 meals per day. Meals vary, maybe fish and veggies and salad. Drinks plenty of water and 2 Diet Cokes daily. No coffee. Prepares some meals at home and eats some meals out.   Goals    . Healthy Lifestyle          Continue to eat heart healthy diet (full of fruits, vegetables, whole grains, lean protein, water--limit salt, fat, and sugar intake) and increase physical activity as tolerated. Continue doing brain stimulating activities (puzzles, reading, adult coloring books, staying active) to keep memory sharp.      . Weight (lb) < 200 lb (90.7 kg)      Fall Risk Fall Risk  04/27/2016 04/13/2015 04/08/2014 04/08/2014  Falls in the past year? No No No No   Depression Screen PHQ 2/9 Scores 04/27/2016 04/13/2015 04/08/2014 04/08/2014  PHQ - 2 Score 0 0 0 0     Cognitive Function MMSE - Mini Mental State Exam 04/27/2016  Orientation to time 5  Orientation to Place 5  Registration 3  Attention/ Calculation 5  Recall 3  Language- name 2 objects 2  Language- repeat 1  Language- follow 3 step command 3  Language- read & follow direction 1  Write a sentence 1  Copy design 1  Total score 30        Immunization History  Administered Date(s) Administered  . Influenza Split 04/10/2011, 06/09/2014  . Influenza Whole 04/08/2010  . Influenza, High Dose Seasonal PF 04/05/2013, 04/13/2015, 04/27/2016  . Pneumococcal Conjugate-13 04/08/2014  . Pneumococcal Polysaccharide-23 07/09/2008, 04/27/2016  . Td 12/10/2006  . Zoster 12/10/2006   Screening Tests Health Maintenance  Topic Date Due  . TETANUS/TDAP  12/09/2016  . MAMMOGRAM  05/20/2017  . COLONOSCOPY  04/15/2024  . INFLUENZA VACCINE  Completed  . DEXA SCAN  Completed  .  ZOSTAVAX  Completed  . Hepatitis C Screening  Completed  . PNA vac Low Risk Adult  Completed      Plan:    Follow-up with Dr. Larose Kells as scheduled.   Bring a copy of your advanced directives to your next office visit.  Flu shot and Pneumovax 23 given today.   During the course of the visit the patient was educated and counseled about the following appropriate screening and preventive services:   Vaccines to include Pneumoccal, Influenza, Hepatitis B, Td, Zostavax, HCV  Colorectal cancer screening  Bone density  screening  Diabetes screening  Glaucoma screening  Mammography/PAP  Nutrition counseling   Patient Instructions (the written plan) was given to the patient.   Dorrene German, RN  04/27/2016  Kathlene November, MD

## 2016-04-28 ENCOUNTER — Other Ambulatory Visit: Payer: Medicare HMO

## 2016-05-23 ENCOUNTER — Ambulatory Visit (INDEPENDENT_AMBULATORY_CARE_PROVIDER_SITE_OTHER): Payer: Medicare HMO | Admitting: Internal Medicine

## 2016-05-23 ENCOUNTER — Encounter: Payer: Self-pay | Admitting: Internal Medicine

## 2016-05-23 VITALS — BP 116/72 | HR 75 | Temp 97.5°F | Resp 12 | Ht 72.0 in | Wt 217.1 lb

## 2016-05-23 DIAGNOSIS — E538 Deficiency of other specified B group vitamins: Secondary | ICD-10-CM | POA: Diagnosis not present

## 2016-05-23 DIAGNOSIS — E785 Hyperlipidemia, unspecified: Secondary | ICD-10-CM | POA: Diagnosis not present

## 2016-05-23 DIAGNOSIS — R079 Chest pain, unspecified: Secondary | ICD-10-CM | POA: Diagnosis not present

## 2016-05-23 DIAGNOSIS — R7989 Other specified abnormal findings of blood chemistry: Secondary | ICD-10-CM

## 2016-05-23 DIAGNOSIS — G4733 Obstructive sleep apnea (adult) (pediatric): Secondary | ICD-10-CM | POA: Diagnosis not present

## 2016-05-23 DIAGNOSIS — Z0000 Encounter for general adult medical examination without abnormal findings: Secondary | ICD-10-CM | POA: Diagnosis not present

## 2016-05-23 DIAGNOSIS — R945 Abnormal results of liver function studies: Secondary | ICD-10-CM

## 2016-05-23 NOTE — Patient Instructions (Addendum)
Get your blood work before you leave    Recommend healthcare power of attorney   Next visit in 6 months     Fall Prevention and [place] cause injuries and can affect all age groups. It is possible to use preventive measures to significantly decrease the likelihood of falls. There are many simple measures which can make your home safer and prevent falls. OUTDOORS  Repair cracks and edges of walkways and driveways.  Remove high doorway thresholds.  Trim shrubbery on the main path into your home.  Have good outside lighting.  Clear walkways of tools, rocks, debris, and clutter.  Check that handrails are not broken and are securely fastened. Both sides of steps should have handrails.  Have leaves, snow, and ice cleared regularly.  Use sand or salt on walkways during winter months.  In the garage, clean up grease or oil spills. BATHROOM  Install night lights.  Install grab bars by the toilet and in the tub and shower.  Use non-skid mats or decals in the tub or shower.  Place a plastic non-slip stool in the shower to sit on, if needed.  Keep floors dry and clean up all water on the floor immediately.  Remove soap buildup in the tub or shower on a regular basis.  Secure bath mats with non-slip, double-sided rug tape.  Remove throw rugs and tripping hazards from the floors. BEDROOMS  Install night lights.  Make sure a bedside light is easy to reach.  Do not use oversized bedding.  Keep a telephone by your bedside.  Have a firm chair with side arms to use for getting dressed.  Remove throw rugs and tripping hazards from the floor. KITCHEN  Keep handles on pots and pans turned toward the center of the stove. Use back burners when possible.  Clean up spills quickly and allow time for drying.  Avoid walking on wet floors.  Avoid hot utensils and knives.  Position shelves so they are not too high or low.  Place commonly used objects within easy  reach.  If necessary, use a sturdy step stool with a grab bar when reaching.  Keep electrical cables out of the way.  Do not use floor polish or wax that makes floors slippery. If you must use wax, use non-skid floor wax.  Remove throw rugs and tripping hazards from the floor. STAIRWAYS  Never leave objects on stairs.  Place handrails on both sides of stairways and use them. Fix any loose handrails. Make sure handrails on both sides of the stairways are as long as the stairs.  Check carpeting to make sure it is firmly attached along stairs. Make repairs to worn or loose carpet promptly.  Avoid placing throw rugs at the top or bottom of stairways, or properly secure the rug with carpet tape to prevent slippage. Get rid of throw rugs, if possible.  Have an electrician put in a light switch at the top and bottom of the stairs. OTHER FALL PREVENTION TIPS  Wear low-heel or rubber-soled shoes that are supportive and fit well. Wear closed toe shoes.  When using a stepladder, make sure it is fully opened and both spreaders are firmly locked. Do not climb a closed stepladder.  Add color or contrast paint or tape to grab bars and handrails in your home. Place contrasting color strips on first and last steps.  Learn and use mobility aids as needed. Install an electrical emergency response system.  Turn on lights to avoid dark  areas. Replace light bulbs that burn out immediately. Get light switches that glow.  Arrange furniture to create clear pathways. Keep furniture in the same place.  Firmly attach carpet with non-skid or double-sided tape.  Eliminate uneven floor surfaces.  Select a carpet pattern that does not visually hide the edge of steps.  Be aware of all pets. OTHER HOME SAFETY TIPS  Set the water temperature for 120 F (48.8 C).  Keep emergency numbers on or near the telephone.  Keep smoke detectors on every level of the home and near sleeping areas. Document Released:  06/16/2002 Document Revised: 12/26/2011 Document Reviewed: 09/15/2011 [facility] Patient Information 2015 [place], [place]. This information is not intended to replace advice given to you by your health care provider. Make sure you discuss any questions you have with your health care provider.   Preventive Care for Adults Ages 71 and over  Blood pressure check.** / Every 1 to 2 years.  Lipid and cholesterol check.**/ Every 5 years beginning at age 17.  Lung cancer screening. / Every year if you are aged 77-80 years and have a 30-pack-year history of smoking and currently smoke or have quit within the past 15 years. Yearly screening is stopped once you have quit smoking for at least 15 years or develop a health problem that would prevent you from having lung cancer treatment.  Fecal occult blood test (FOBT) of stool. / Every year beginning at age 39 and continuing until age 53. You may not have to do this test if you get a colonoscopy every 10 years.  Flexible sigmoidoscopy** or colonoscopy.** / Every 5 years for a flexible sigmoidoscopy or every 10 years for a colonoscopy beginning at age 69 and continuing until age 50.  Hepatitis C blood test.** / For all people born from [birth year] through 1967 and any individual with known risks for hepatitis C.  Abdominal aortic aneurysm (AAA) screening.** / A one-time screening for ages 22 to 53 years who are current or former smokers.  Skin self-exam. / Monthly.  Influenza vaccine. / Every year.  Tetanus, diphtheria, and acellular pertussis (Tdap/Td) vaccine.** / 1 dose of Td every 10 years.  Varicella vaccine.** / Consult your health care provider.  Zoster vaccine.** / 1 dose for adults aged 76 years or older.  Pneumococcal 13-valent conjugate (PCV13) vaccine.** / Consult your health care provider.  Pneumococcal polysaccharide (PPSV23) vaccine.** / 1 dose for all adults aged 58 years and older.  Meningococcal vaccine.** / Consult your health care  provider.  Hepatitis A vaccine.** / Consult your health care provider.  Hepatitis B vaccine.** / Consult your health care provider.  Haemophilus influenzae type b (Hib) vaccine.** / Consult your health care provider. **Family history and personal history of risk and conditions may change your health care provider's recommendations. Document Released: 08/22/2001 Document Revised: 07/01/2013 Document Reviewed: 11/21/2010 [facility] Patient Information 2015 [place], [place]. This information is not intended to replace advice given to you by your health care provider. Make sure you discuss any questions you have with your health care provider.

## 2016-05-23 NOTE — Assessment & Plan Note (Addendum)
Td 08; pneumonia shot 12-09; prevnar -- 2015; zostavax 2009; had a Flu shot  Last gyn visit 01-2016, had a PAPs ; DEXA- MMG-- per gyn Cscope 2005 and 04-2015 neg, 10 years   Palpable Ao --- 04-2014 u/s no AAA Labs  : BMP, AST, ALT, FLP, CBC, TSH, B12. Discussed diet, exercise, healthcare power of attorney, fall prevention.

## 2016-05-23 NOTE — Progress Notes (Signed)
Subjective:    Patient ID: Emily Parrish, female    DOB: [birth year], 70 y.o.   MRN: [id number]  DOS:  05/23/2016 Type of visit - description : CPX Interval history:  Good compliance w/ medication. No major concerns except she feels tired, like she is not rested when she wakes up. She uses her CPAP consistently, sometimes does need to take a nap.   Review of Systems Constitutional: No fever. No chills. No unexplained wt changes. No unusual sweats  HEENT: No dental problems, no ear discharge, no facial swelling, no voice changes. No eye discharge, no eye  redness , no  intolerance to light   Respiratory: No wheezing , no  difficulty breathing. No cough , no mucus production  Cardiovascular:  When asked, reports chest pain, on and off, for years, frequency not clear, "sometimes". Is  a tightness at the front chest, no radiation, sometimes when she walks, decreased w/ belching, no associated nausea, diaphoresis or difficulty breathing. May last 10-15 minutes.  no leg swelling , no  Palpitations  GI: no nausea, no vomiting, no diarrhea , no  abdominal pain.  No blood in the stools. No dysphagia, no odynophagia    Endocrine: No polyphagia, no polyuria , no polydipsia  GU: No dysuria, gross hematuria, difficulty urinating. No urinary urgency, no frequency.  Musculoskeletal: No joint swellings or unusual aches or pains  Skin: No change in the color of the skin, palor , no  Rash  Allergic, immunologic: No environmental allergies , no  food allergies  Neurological: No dizziness no  syncope. No headaches. No diplopia, no slurred, no slurred speech, no motor deficits, no facial  Numbness  Hematological: No enlarged lymph nodes, no easy bruising , no unusual bleedings  Psychiatry: No suicidal ideas, no hallucinations, no beavior problems, no confusion.  No unusual/severe anxiety, no depression   Past Medical History:  Diagnosis Date  . Anxiety   . Esophageal reflux   . Fatty  liver     NASH--s/p liver Bx 2011, early portal fibrosis   . Goiter   . Hiatal hernia   . OSA (obstructive sleep apnea) 12/07/2010   Apnealink 12/01/10>>AHI 9, RDI 10, SpO2 low 82%.   [name] Urethral stricture    uretheral stricture , s/p dilatation per urology ~ 2005  . Vitamin B 12 deficiency    mild   . Vitamin D deficiency     Past Surgical History:  Procedure Laterality Date  . APPENDECTOMY    . BIOPSY THYROID  2016   Consistent w/ benign follicular nodule  . ESOPHAGOGASTRODUODENOSCOPY  2005   [place]    Social History   Social History  . Marital status: Divorced    Spouse name: N/A  . Number of children: 0  . Years of education: N/A   Occupational History  . 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 Smoking status: Former Smoker    Years: 10.00    Types: Cigarettes    Quit date: 07/11/1971  . Smokeless tobacco: Never Used     Comment: used to smoke--1 1/2 pack per week  . Alcohol use 1.8 - 3.0 oz/week    3 - 5 Glasses of wine per week  . Drug use: No  . Sexual activity: No   Other Topics Concern  . Not on file   Social History Narrative   Lives by herself , no family in town   emergency  contact Graceann Congress [phone])     Family History  Problem Relation Age of Onset  . Coronary artery disease Maternal Grandfather   . Breast cancer Maternal Aunt   . Scleroderma Mother   . Emphysema Father   . Stroke Brother   . Colon cancer Neg Hx   . Diabetes Neg Hx        Medication List       Accurate as of 05/23/16 11:59 PM. Always use your most recent med list.          citalopram 20 MG tablet Commonly known as:  CELEXA Take 1 tablet (20 mg total) by mouth daily.   MILK THISTLE PO Take 400 mg by mouth daily.   MULTIVITAMIN PO Take by mouth daily.   RABEprazole 20 MG tablet Commonly known as:  ACIPHEX Take 1 tablet (20 mg total) by mouth daily.          Objective:   Physical Exam BP 116/72  (BP Location: Left Arm, Patient Position: Sitting, Cuff Size: Normal)   Pulse 75   Temp 97.5 F (36.4 C) (Oral)   Resp 12   Ht 6' (1.829 m)   Wt 217 lb 2 oz (98.5 kg)   LMP 07/10/1996   SpO2 98%   BMI 29.45 kg/m   General:   Well developed, well nourished . NAD.  Neck: + Thyromegaly, right-sided, not tender or nodular  HEENT:  Normocephalic . Face symmetric, atraumatic Lungs:  CTA B Normal respiratory effort, no intercostal retractions, no accessory muscle use. Heart: RRR,  no murmur.  No pretibial edema bilaterally  Abdomen:  Not distended, soft, non-tender. No rebound or rigidity.   Skin: Exposed areas without rash. Not pale. Not jaundice Neurologic:  alert & oriented X3.  Speech normal, gait appropriate for age and unassisted Strength symmetric and appropriate for age.  Psych: Cognition and judgment appear intact.  Cooperative with normal attention span and concentration.  Behavior appropriate. No anxious or depressed appearing.    Assessment & Plan:    Assessment > Anxiety Mild dyslipidemia Goiter, had one (-) FNA before, last [place] 04-2014, 04-2015 FNA (-) GI: --GERD, HH --Fatty liver---NASH--s/p liver Bx 2011, early portal fibrosis  OSA ----Apnealink 12/01/10>>AHI 9, RDI 10, SpO2 low 82%.  H/o urethral stricture-- s/p dilatation, urology B12 and Vit D deficiency  PLAN: Anxiety: Controlled on citalopram Hyperlipidemia: Diet control, checking labs Goiter: Last biopsy negative. Reassess next year Fatigue: ROS essentially negative except for CP. Will do general labs. See comments under sleep apnea, h/o low B12 ( we are recheking) OSA: Good compliance with CPAP, settings have not been checked in years. Referred to sleep apnea specialist. B12 and vitamin D deficiency: on no supplements, recheck a b12, last vitamin d normal. encourage otc supplements. chest pain: for years, stable pattern, ekg today sinus rhythm, no acute changes. we agreed observation only. rtc 6  months

## 2016-05-23 NOTE — Progress Notes (Signed)
Pre visit review using our clinic review tool, if applicable. No additional management support is needed unless otherwise documented below in the visit note. 

## 2016-05-24 LAB — CBC WITH DIFFERENTIAL/PLATELET
BASOS ABS: 0 10*3/uL (ref 0.0–0.1)
BASOS PCT: 0.8 % (ref 0.0–3.0)
EOS ABS: 0.1 10*3/uL (ref 0.0–0.7)
EOS PCT: 2.7 % (ref 0.0–5.0)
HEMATOCRIT: 41 % (ref 36.0–46.0)
HEMOGLOBIN: 13.8 g/dL (ref 12.0–15.0)
LYMPHS ABS: 1.6 10*3/uL (ref 0.7–4.0)
Lymphocytes Relative: 37.8 % (ref 12.0–46.0)
MCHC: 33.7 g/dL (ref 30.0–36.0)
MCV: 94.5 fl (ref 78.0–100.0)
MONO ABS: 0.3 10*3/uL (ref 0.1–1.0)
Monocytes Relative: 7.8 % (ref 3.0–12.0)
NEUTROS ABS: 2.2 10*3/uL (ref 1.4–7.7)
NEUTROS PCT: 50.9 % (ref 43.0–77.0)
Platelets: 141 10*3/uL — ABNORMAL LOW (ref 150.0–400.0)
RBC: 4.34 Mil/uL (ref 3.87–5.11)
RDW: 13.4 % (ref 11.5–15.5)
WBC: 4.3 10*3/uL (ref 4.0–10.5)

## 2016-05-24 LAB — LIPID PANEL
CHOLESTEROL: 232 mg/dL — AB (ref 0–200)
HDL: 42.6 mg/dL (ref >39.00)
LDL CALC: 162 mg/dL — AB (ref 0–99)
NonHDL: 189.27
TRIGLYCERIDES: 138 mg/dL (ref 0.0–149.0)
Total CHOL/HDL Ratio: 5
VLDL: 27.6 mg/dL (ref 0.0–40.0)

## 2016-05-24 LAB — VITAMIN B12: Vitamin B-12: 256 pg/mL (ref 211–911)

## 2016-05-24 LAB — AST: AST: 189 U/L — ABNORMAL HIGH (ref 0–37)

## 2016-05-24 LAB — BASIC METABOLIC PANEL
BUN: 16 mg/dL (ref 6–23)
CALCIUM: 9.4 mg/dL (ref 8.4–10.5)
CHLORIDE: 102 meq/L (ref 96–112)
CO2: 28 meq/L (ref 19–32)
Creatinine, Ser: 0.96 mg/dL (ref 0.40–1.20)
GFR: 60.74 mL/min (ref >60.00)
GLUCOSE: 73 mg/dL (ref 70–99)
POTASSIUM: 4.6 meq/L (ref 3.5–5.1)
SODIUM: 138 meq/L (ref 135–145)

## 2016-05-24 LAB — ALT: ALT: 279 U/L — ABNORMAL HIGH (ref 0–35)

## 2016-05-24 LAB — TSH: TSH: 4.01 u[IU]/mL (ref 0.35–4.50)

## 2016-05-24 NOTE — Assessment & Plan Note (Signed)
Anxiety: Controlled on citalopram Hyperlipidemia: Diet control, checking labs Goiter: Last biopsy negative. Reassess next year Fatigue: ROS essentially negative except for CP. Will do general labs. See comments under sleep apnea, h/o low B12 ( we are recheking) OSA: Good compliance with CPAP, settings have not been checked in years. Referred to sleep apnea specialist. B12 and vitamin D deficiency: on no supplements, recheck a b12, last vitamin d normal. encourage otc supplements. chest pain: for years, stable pattern, ekg today sinus rhythm, no acute changes. we agreed observation only. rtc 6 months

## 2016-05-25 NOTE — Addendum Note (Signed)
Addended byDamita Dunnings D on: 05/25/2016 01:37 PM   Modules accepted: Orders

## 2016-06-14 ENCOUNTER — Other Ambulatory Visit (INDEPENDENT_AMBULATORY_CARE_PROVIDER_SITE_OTHER): Payer: Medicare HMO

## 2016-06-14 DIAGNOSIS — R7989 Other specified abnormal findings of blood chemistry: Secondary | ICD-10-CM | POA: Diagnosis not present

## 2016-06-14 DIAGNOSIS — R945 Abnormal results of liver function studies: Principal | ICD-10-CM

## 2016-06-14 LAB — HEPATIC FUNCTION PANEL
ALK PHOS: 61 U/L (ref 39–117)
ALT: 227 U/L — ABNORMAL HIGH (ref 0–35)
AST: 128 U/L — ABNORMAL HIGH (ref 0–37)
Albumin: 3.9 g/dL (ref 3.5–5.2)
BILIRUBIN DIRECT: 0.1 mg/dL (ref 0.0–0.3)
BILIRUBIN TOTAL: 0.5 mg/dL (ref 0.2–1.2)
Total Protein: 7.5 g/dL (ref 6.0–8.3)

## 2016-06-17 DIAGNOSIS — G4733 Obstructive sleep apnea (adult) (pediatric): Secondary | ICD-10-CM | POA: Diagnosis not present

## 2016-06-28 ENCOUNTER — Encounter: Payer: Self-pay | Admitting: Emergency Medicine

## 2016-06-28 ENCOUNTER — Encounter: Payer: Self-pay | Admitting: Physician Assistant

## 2016-06-28 ENCOUNTER — Ambulatory Visit (INDEPENDENT_AMBULATORY_CARE_PROVIDER_SITE_OTHER): Payer: Medicare HMO | Admitting: Physician Assistant

## 2016-06-28 VITALS — BP 138/80 | HR 76 | Ht 71.75 in | Wt 221.0 lb

## 2016-06-28 DIAGNOSIS — R7989 Other specified abnormal findings of blood chemistry: Secondary | ICD-10-CM

## 2016-06-28 DIAGNOSIS — K7581 Nonalcoholic steatohepatitis (NASH): Secondary | ICD-10-CM | POA: Diagnosis not present

## 2016-06-28 DIAGNOSIS — R945 Abnormal results of liver function studies: Principal | ICD-10-CM

## 2016-06-28 NOTE — Progress Notes (Signed)
Chief Complaint: Elevated LFT's  HPI:  Emily Parrish is a 70 y/o Caucasian female past medical history of NASH status post liver biopsy 2011 with early portal fibrosis, reflux, anxiety, OSA,  who was referred to me by Colon Branch, MD for a complaint of elevated liver function tests. Patient followed with Dr. Sharlett Iles in the past and her care was transferred to Dr. Henrene Pastor who did her screening colonoscopy in 2015. This was normal and she was told to repeat in 10 years.   Most recent labs were completed 2 weeks ago which showed an AST elevated at 128 and an ALT of 227, one month ago these were increased with an AST at 189 ALT of 279, 2 years ago these were normal.   Per chart review patient saw Dr. Sharlett Iles back in 2011/2012 for this same complaint. At that time she had a workup including a liver biopsy which showed NASH with early portal fibrosis. According to notes patient stopped using ibuprofen and started milk thistle and with time her liver function tests went back to normal. These have been normal for the past few years until recently.   Today, the patient reminds me of the above and that she has had a full workup for this in the past. Back in November  she had labs which showed an elevated AST and ALT for the first time in 2 years, these were redrawn 2 weeks ago and had already decreased some. The patient tells me that when she was told she had elevated liver enzymes again she restarted her milk thistle 247m a day. She believes this is causing that decreases already. She denies any imaging in the interim since 2012. She denies any new medications. The patient denies any symptoms at this time and tells me that she feels well.   Patient's reflux continues to be controlled on AcipHex 20 mg daily. She does continue on citalopram 20 mg daily.   Patient's social history is positive for her retiring fully in January, she hopes to get back in the gym and lose some weight after that point.   Patient  denies fever, chills, blood in her stool, melena, change in bowel habits, weight loss, fatigue, anorexia, nausea, vomiting, jaundice, pruritus, heartburn, reflux or abdominal pain.  Past Medical History:  Diagnosis Date  . Anxiety   . Esophageal reflux   . Fatty liver     NASH--s/p liver Bx 2011, early portal fibrosis   . Goiter   . Hiatal hernia   . OSA (obstructive sleep apnea) 12/07/2010   Apnealink 12/01/10>>AHI 9, RDI 10, SpO2 low 82%.   .Marland KitchenUrethral stricture    uretheral stricture , s/p dilatation per urology ~ 2005  . Vitamin B 12 deficiency    mild   . Vitamin D deficiency     Past Surgical History:  Procedure Laterality Date  . APPENDECTOMY    . BIOPSY THYROID  2016   Consistent w/ benign follicular nodule  . [facility]   Current Outpatient Prescriptions  Medication Sig Dispense Refill  . citalopram (CELEXA) 20 MG tablet Take 1 tablet (20 mg total) by mouth daily. 30 tablet 0  . MILK THISTLE PO Take 400 mg by mouth daily.     . Multiple Vitamins-Minerals (MULTIVITAMIN PO) Take by mouth daily.     . RABEprazole (ACIPHEX) 20 MG tablet Take 1 tablet (20 mg total) by mouth daily. 90 tablet 2   No current facility-administered medications for  this visit.     Allergies as of 06/28/2016 - Review Complete 06/28/2016  Allergen Reaction Noted  . Fluticasone-salmeterol      Family History  Problem Relation Age of Onset  . Coronary artery disease Maternal Grandfather   . Breast cancer Maternal Aunt   . Scleroderma Mother   . Emphysema Father   . Stroke Brother   . Colon cancer Neg Hx   . Diabetes Neg Hx     Social History   Social History  . Marital status: Divorced    Spouse name: N/A  . Number of children: 0  . Years of education: N/A   Occupational History  . 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 Smoking status: Former Smoker    Years: 10.00    Types: Cigarettes     Quit date: 07/11/1971  . Smokeless tobacco: Never Used     Comment: used to smoke--1 1/2 pack per week  . Alcohol use 1.8 - 3.0 oz/week    3 - 5 Glasses of wine per week  . Drug use: No  . Sexual activity: No   Other Topics Concern  . Not on file   Social History Narrative   Lives by herself , no family in town   emergency contact Graceann Congress [phone])    Review of Systems:    Constitutional: No weight loss, fever, chills, weakness or fatigue Skin: No rash or itching Cardiovascular: No chest pain Respiratory: No SOB  Gastrointestinal: See HPI and otherwise negative Psychiatric: Positive for anxiety    Physical Exam:  Vital signs: BP 138/80   Pulse 76   Ht 5' 11.75" (1.822 m)   Wt 221 lb (100.2 kg)   LMP 07/10/1996   BMI 30.18 kg/m   Constitutional:   Pleasant Caucasian female appears to be in NAD, Well developed, Well nourished, alert and cooperative Respiratory: Respirations even and unlabored. Lungs clear to auscultation bilaterally.   No wheezes, crackles, or rhonchi.  Cardiovascular: Normal S1, S2. No MRG. Regular rate and rhythm. No peripheral edema, cyanosis or pallor.  Gastrointestinal:  Soft, nondistended, nontender. No rebound or guarding. Normal bowel sounds. No appreciable masses or hepatomegaly. Psychiatric: Demonstrates good judgement and reason without abnormal affect or behaviors.  MOST RECENT LABS: CBC    Component Value Date/Time   WBC 4.3 05/23/2016 1511   RBC 4.34 05/23/2016 1511   HGB 13.8 05/23/2016 1511   HGB 12.4 11/24/2011 1507   HCT 41.0 05/23/2016 1511   HCT 35.8 11/24/2011 1507   PLT 141.0 (L) 05/23/2016 1511   PLT 127 (L) 11/24/2011 1507   MCV 94.5 05/23/2016 1511   MCV 93 11/24/2011 1507   MCH 32.2 11/24/2011 1507   MCH 32.3 03/25/2010 0826   MCHC 33.7 05/23/2016 1511   RDW 13.4 05/23/2016 1511   RDW 12.8 11/24/2011 1507   LYMPHSABS 1.6 05/23/2016 1511   LYMPHSABS 1.5 11/24/2011 1507   MONOABS 0.3 05/23/2016 1511   EOSABS 0.1  05/23/2016 1511   EOSABS 0.1 11/24/2011 1507   BASOSABS 0.0 05/23/2016 1511   BASOSABS 0.0 11/24/2011 1507    CMP     Component Value Date/Time   NA 138 05/23/2016 1511   K 4.6 05/23/2016 1511   CL 102 05/23/2016 1511   CO2 28 05/23/2016 1511   GLUCOSE 73 05/23/2016 1511   GLUCOSE 101 (H) 04/30/2006 1630   BUN 16 05/23/2016 1511   CREATININE 0.96 05/23/2016  1511   CALCIUM 9.4 05/23/2016 1511   PROT 7.5 06/14/2016 0802   ALBUMIN 3.9 06/14/2016 0802   AST 128 (H) 06/14/2016 0802   ALT 227 (H) 06/14/2016 0802   ALKPHOS 61 06/14/2016 0802   BILITOT 0.5 06/14/2016 0802   GFRNONAA 55.20 01/11/2010 0000   GFRAA 93 07/09/2008 0950    Assessment: 1. Elevated LFTs: These recently became elevated again within the past month or 2, these were normal 2 years ago and prior to that had been elevated in 2011/12, patient did have full workup for this in 2012 with Dr. Sharlett Iles including a liver biopsy which showed NASH and early portal fibrosis, patient has not had imaging since then, suspect that these are elevated for the same reason as before, patient has also gained some weight since that time which is likely contributing 2. History of NASH: See above  Plan: 1. Discussed with the patient that likely there is no new reason for her liver enzyme elevation, she has restarted milk thistle as this seems to have helped her in the past, we did discuss this in detail today and how there is no research based behind this, but if this is working for her she can continue if she wishes 2. Recommend the patient achieved a slow and steady weight loss at least 1-2 pounds per week. 3. Will repeat liver imaging at this time with a right upper quadrant ultrasound to compare this to previous to make sure there are no signs of cirrhosis, etc. 4. Recommend the patient have repeat LFTs on 07/16/15 which will be a month after her last draw to ensure that these continue to trend down 5. If LFTs continue to trend down and  ultrasound does not show any new findings, likely LFTs can continue to be monitored every 6 months in the future as long as patient is not having symptoms 6. Patient will follow in clinic in 2-3 months with Dr. Henrene Pastor for any further recommendations  Ellouise Newer, PA-C [facility] 06/28/2016, 9:46 AM  Cc: Colon Branch, MD

## 2016-06-28 NOTE — Addendum Note (Signed)
Addended by: Wyline Beady on: 06/28/2016 03:17 PM   Modules accepted: Orders

## 2016-06-28 NOTE — Patient Instructions (Signed)
You have been scheduled for an abdominal ultrasound at [facility]iology (1st floor of hospital) on 07/07/16 at 11 am. Please arrive 15 minutes prior to your appointment for registration. Make certain not to have anything to eat or drink 6 hours prior to your appointment. Should you need to reschedule your appointment, please contact radiology at [phone]. This test typically takes about 30 minutes to perform.  Your physician has requested that you go to the basement for lab work around 07/16/15.

## 2016-06-28 NOTE — Progress Notes (Signed)
Emily Parrish, Excellent consultation note Reviewed. History of NASH with fibrosis on biopsy previously. Liver tests had improved. Now abnormal. Has been followed loosely. Certainly could be NASH but a bit higher transaminase level than typical. Noted that her platelets were slightly low. Question portal hypertension. Recommend abdominal ultrasound to include liver and spleen (as opposed to right upper quadrant ultrasound alone). Also, would check autoimmune markers, (despite normal globulins, as this is a potentially treatable entity) and PT/INR to assess synthetic function. Finally, exercise and weight loss critical. Indeed, have her follow-up with me as you recommended in a few months

## 2016-06-29 NOTE — Addendum Note (Signed)
Addended by: Wyline Beady on: 06/29/2016 09:11 AM   Modules accepted: Orders

## 2016-07-06 ENCOUNTER — Ambulatory Visit (INDEPENDENT_AMBULATORY_CARE_PROVIDER_SITE_OTHER): Payer: Medicare HMO | Admitting: Neurology

## 2016-07-06 ENCOUNTER — Encounter: Payer: Self-pay | Admitting: Neurology

## 2016-07-06 VITALS — BP 112/68 | HR 70 | Resp 16 | Ht 71.75 in | Wt 220.0 lb

## 2016-07-06 DIAGNOSIS — G4733 Obstructive sleep apnea (adult) (pediatric): Secondary | ICD-10-CM

## 2016-07-06 DIAGNOSIS — Z9989 Dependence on other enabling machines and devices: Secondary | ICD-10-CM | POA: Diagnosis not present

## 2016-07-06 NOTE — Progress Notes (Signed)
SLEEP MEDICINE CLINIC   Provider:  Larey Seat, M D  Referring Provider: Colon Branch, MD Primary Care Physician:  Kathlene November, MD  Chief Complaint  Patient presents with  . Sleep Consult    Rm 12. Last sleep study was 2012. She was placed on CPAP and is currently using CPAP.     HPI:  Emily Parrish is a 70 y.o. female , seen here as a referral from Dr. Larose Kells for CPAP re - evaluation, She was in 2012 evaluated by a HST and diagnosed with OSA, has been on CPAP since . She felt improvement in sleepiness.  She does report that she needs caffeine in the morning to get going and she has not woken up as refreshed and restored, she never had the Lazarus effect. She was verbally told that her sleep apnea was mild. She reports further fatigue, hearing loss, itching and easy bruising. Her only surgical history is an appendectomy 1948. She is retiring in a couple of weeks, endorsed the Epworth sleepiness score at 8 point fatigue severity at 33 points, and the geriatric depression score is only 1 out of 15 points.  I have also the opportunity to review her original home sleep test, this was performed with an apnea link, interpreted by Dr. Governor Rooks. Her AHI was 9,  her RDI 12 per hour she did have borderline brady- tachycardia between 49 bpm and  160 bpm at the higher heart rates.  She did  spend  8 minutes time in oxygen desaturation at or under 88 %.  Sleep habits are as follows: she gets to bed between 22 and 23.00 hours and is up at 8 AM.  Some nights she describes her sleep as fragmented , but many nights she is able to fall asleep promptly. The bedroom is cool, quiet and dark. She sleeps alone, no pets in the bedroom. She only uses one pillow for head support, she prefers to sleep on her side. She is not a restless sleeper. No morning headaches, diaphoresis, neither palpitations but a dry mouth. She drinks plenty of water throughout the day and has one bathroom break at night.   Sleep  medical history and family sleep history:  No tonsillectomy, no cervical apinal surgery, ENT or trauma.   Social history: The social history was already noted by Dr. Larose Kells, 325 glasses of wine per week, one or 2 drinks of Coke ( not coffee) in the morning, she quit tobacco smoking in 1973. She is single and lives alone, works for [employer], college classes, not graduated.   Review of Systems: Out of a complete 14 system review, the patient complains of only the following symptoms, and all other reviewed systems are negative. Snoring,   Epworth score 8 , Fatigue severity score 33  , depression score 1/15   Social History   Social History  . Marital status: Divorced    Spouse name: N/A  . Number of children: 0  . Years of education: college   Occupational History  . 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 Smoking status: Former Smoker    Years: 10.00    Types: Cigarettes    Quit date: 07/11/1971  . Smokeless tobacco: Never Used     Comment: used to smoke--1 1/2 pack per week  . Alcohol use 1.8 - 3.0 oz/week    3 - 5 Glasses of wine per week  .  Drug use: No  . Sexual activity: No   Other Topics Concern  . Not on file   Social History Narrative   Lives by herself , no family in town   emergency contact Graceann Congress [phone])   Drinks about 1-2 caffeine drinks a day     Family History  Problem Relation Age of Onset  . Coronary artery disease Maternal Grandfather   . Breast cancer Maternal Aunt   . Scleroderma Mother   . Emphysema Father   . Stroke Brother   . Colon cancer Neg Hx   . Diabetes Neg Hx     Past Medical History:  Diagnosis Date  . Anxiety   . Esophageal reflux   . Fatty liver     NASH--s/p liver Bx 2011, early portal fibrosis   . Goiter   . Hiatal hernia   . OSA (obstructive sleep apnea) 12/07/2010   Apnealink 12/01/10>>AHI 9, RDI 10, SpO2 low 82%.   [name] Urethral stricture    uretheral  stricture , s/p dilatation per urology ~ 2005  . Vitamin B 12 deficiency    mild   . Vitamin D deficiency     Past Surgical History:  Procedure Laterality Date  . APPENDECTOMY    . BIOPSY THYROID  2016   Consistent w/ benign follicular nodule  . ESOPHAGOGASTRODUODENOSCOPY  2005   [facility]    Current Outpatient Prescriptions  Medication Sig Dispense Refill  . citalopram (CELEXA) 20 MG tablet Take 1 tablet (20 mg total) by mouth daily. 30 tablet 0  . MILK THISTLE PO Take 400 mg by mouth daily.     . Multiple Vitamins-Minerals (MULTIVITAMIN PO) Take by mouth daily.     . RABEprazole (ACIPHEX) 20 MG tablet Take 1 tablet (20 mg total) by mouth daily. 90 tablet 2   No current facility-administered medications for this visit.     Allergies as of 07/06/2016 - Review Complete 07/06/2016  Allergen Reaction Noted  . Fluticasone-salmeterol      Vitals: BP 112/68   Pulse 70   Resp 16   Ht 5' 11.75" (1.822 m)   Wt 220 lb (99.8 kg)   LMP 07/10/1996   BMI 30.05 kg/m  Last Weight:  Wt Readings from Last 1 Encounters:  07/06/16 220 lb (99.8 kg)   YTK:ZSWF mass index is 30.05 kg/m.     Last Height:   Ht Readings from Last 1 Encounters:  07/06/16 5' 11.75" (1.822 m)    Physical exam:  General: The patient is awake, alert and appears not in acute distress. The patient is well groomed. Head: Normocephalic, atraumatic. Neck is supple. Mallampati 3, uvula rests on tongue, upper dentures- permanent  bridge .  neck circumference:1 5. Nasal airflow patent , TMJ is  Not  evident . Retrognathia is not seen.  Cardiovascular:  Regular rate and rhythm , without  murmurs or carotid bruit, and without distended neck veins. Respiratory: Lungs are clear to auscultation. Skin:  Without evidence of edema, or rash Trunk: BMI is 30.5 . The patient's posture is erect.   Neurologic exam : The patient is awake and alert, oriented to place and time.   Memory subjective  described as intact.  Memory  testing revealed  MOCA:No flowsheet data found. MMSE: MMSE - Mini Mental State Exam 04/27/2016  Orientation to time 5  Orientation to Place 5  Registration 3  Attention/ Calculation 5  Recall 3  Language- name 2 objects 2  Language- repeat 1  Language- follow  3 step command 3  Language- read & follow direction 1  Write a sentence 1  Copy design 1  Total score 30   Attention span & concentration ability appears normal.  Speech is fluent,  without  dysarthria, dysphonia or aphasia.  Mood and affect are appropriate.  Cranial nerves: Pupils are equal and briskly reactive to light. Funduscopic exam without  evidence of pallor or edema. Extraocular movements  in vertical and horizontal planes intact and without nystagmus. Visual fields by finger perimetry are intact. Hearing to finger rub intact.   Facial sensation intact to fine touch.  Facial motor strength is symmetric and tongue and uvula move midline. Shoulder shrug was symmetrical.   Motor exam: Normal tone, muscle bulk and symmetric strength in all extremities.  Sensory:  Fine touch, pinprick and vibration were normal. Proprioception  in the upper extremities was normal.  Coordination: Rapid alternating movements / Finger-to-nose maneuver  normal without evidence of ataxia, dysmetria or tremor.  Gait and station: Patient walks without assistive device and is able unassisted to climb up to the exam table. Strength within normal limits.  Stance is stable and normal.  Deep tendon reflexes: in the  upper and lower extremities are symmetric and intact. Babinski maneuver response is downgoing.   I was able to see a download of the patient's CPAP compliance, she's 100% compliance for the last 30 days, 8 hours and 11 minutes are her average user times. She's using an AutoPap, between 5 and 15 cm water pressure was 27 m EPR and the 95th percentile pressure is 13.2. Residual AHI is 1.5. There would be no adjustments needed but I  discussed with the patient that she may not need CPAP at all if she could succeed was weight loss. Her baseline apnea at the time of diagnosis was very mild. There were no significant hypoxemia periods, and she did have some tachycardia and bradycardia heart rates. This indicates that there is some physiological stressor. I cannot conclude if there were any REM sleep dependent apneas, given that the patient was diagnosed with a basic home sleep test. I feel however that she could use a dental device , it would treat mild apnea as well as snoring.  Once she has entered retirement, she will have an easier time was weight loss , too.  She reports she tried a dental device but it did not work.   The patient was advised of the nature of the diagnosed sleep disorder , the treatment options and risks for general a health and wellness arising from not treating the condition.  I spent more than  65mnutes of face to face time with the patient. Greater than 50% of time was spent in counseling and coordination of care. We have discussed the diagnosis and differential and I answered the patient's questions.     Assessment:  After physical and neurologic examination, review of laboratory studies,  Personal review of imaging studies, reports of other /same  Imaging studies ,  Results of polysomnography/ neurophysiology testing and pre-existing records as far as provided in visit., my assessment is   OSA , very mild, but dental device was not succesfull, continue CPAP until you have lost weight, reached a BMI of 28 or below. We will retest you than and likely take you off CPAP.  RV in 6 month       CAsencion PartridgeDohmeier MD  07/06/2016   CC: JColon Branch Md [address]  [id number]          

## 2016-07-07 ENCOUNTER — Ambulatory Visit (HOSPITAL_COMMUNITY): Payer: Medicare HMO

## 2016-07-11 ENCOUNTER — Ambulatory Visit (HOSPITAL_COMMUNITY)
Admission: RE | Admit: 2016-07-11 | Discharge: 2016-07-11 | Disposition: A | Discharge status: Home / Self Care | Payer: Medicare HMO | Source: Ambulatory Visit | Attending: Physician Assistant | Admitting: Physician Assistant

## 2016-07-11 DIAGNOSIS — K760 Fatty (change of) liver, not elsewhere classified: Secondary | ICD-10-CM | POA: Diagnosis not present

## 2016-07-11 DIAGNOSIS — K7581 Nonalcoholic steatohepatitis (NASH): Secondary | ICD-10-CM | POA: Diagnosis not present

## 2016-07-11 DIAGNOSIS — K7689 Other specified diseases of liver: Secondary | ICD-10-CM | POA: Diagnosis not present

## 2016-07-11 DIAGNOSIS — R7989 Other specified abnormal findings of blood chemistry: Secondary | ICD-10-CM | POA: Insufficient documentation

## 2016-07-11 DIAGNOSIS — R945 Abnormal results of liver function studies: Secondary | ICD-10-CM

## 2016-08-03 DIAGNOSIS — Z1231 Encounter for screening mammogram for malignant neoplasm of breast: Secondary | ICD-10-CM | POA: Diagnosis not present

## 2016-08-06 ENCOUNTER — Other Ambulatory Visit: Payer: Self-pay | Admitting: Internal Medicine

## 2016-08-10 NOTE — Progress Notes (Signed)
Received fax approval from [facility] that rabeprazole sodium has been approved effective 07/08/16-12/31-18.

## 2016-08-17 ENCOUNTER — Encounter: Payer: Self-pay | Admitting: Certified Nurse Midwife

## 2016-08-17 DIAGNOSIS — R69 Illness, unspecified: Secondary | ICD-10-CM | POA: Diagnosis not present

## 2016-08-30 ENCOUNTER — Encounter: Payer: Medicare HMO | Admitting: Internal Medicine

## 2016-09-19 DIAGNOSIS — G4733 Obstructive sleep apnea (adult) (pediatric): Secondary | ICD-10-CM | POA: Diagnosis not present

## 2016-09-25 DIAGNOSIS — R300 Dysuria: Secondary | ICD-10-CM | POA: Diagnosis not present

## 2016-09-25 DIAGNOSIS — N390 Urinary tract infection, site not specified: Secondary | ICD-10-CM | POA: Diagnosis not present

## 2016-10-24 ENCOUNTER — Other Ambulatory Visit: Payer: Self-pay | Admitting: Internal Medicine

## 2017-01-04 ENCOUNTER — Ambulatory Visit: Payer: Medicare HMO | Admitting: Neurology

## 2017-01-05 ENCOUNTER — Telehealth: Payer: Self-pay

## 2017-01-05 NOTE — Telephone Encounter (Signed)
Patient is on the list for [employer] 2018 and may be a good candidate for an AWV. Please let me know if/when appt is scheduled.

## 2017-01-09 ENCOUNTER — Other Ambulatory Visit: Payer: Self-pay | Admitting: Internal Medicine

## 2017-01-31 DIAGNOSIS — R69 Illness, unspecified: Secondary | ICD-10-CM | POA: Diagnosis not present

## 2017-01-31 DIAGNOSIS — Z6829 Body mass index (BMI) 29.0-29.9, adult: Secondary | ICD-10-CM | POA: Diagnosis not present

## 2017-01-31 DIAGNOSIS — K219 Gastro-esophageal reflux disease without esophagitis: Secondary | ICD-10-CM | POA: Diagnosis not present

## 2017-01-31 DIAGNOSIS — Z0000 Encounter for general adult medical examination without abnormal findings: Secondary | ICD-10-CM | POA: Diagnosis not present

## 2017-02-01 ENCOUNTER — Ambulatory Visit: Payer: Medicare HMO | Admitting: Certified Nurse Midwife

## 2017-02-22 DIAGNOSIS — G4733 Obstructive sleep apnea (adult) (pediatric): Secondary | ICD-10-CM | POA: Diagnosis not present

## 2017-03-28 ENCOUNTER — Other Ambulatory Visit (INDEPENDENT_AMBULATORY_CARE_PROVIDER_SITE_OTHER): Payer: Medicare HMO

## 2017-03-28 DIAGNOSIS — K7581 Nonalcoholic steatohepatitis (NASH): Secondary | ICD-10-CM | POA: Diagnosis not present

## 2017-03-28 DIAGNOSIS — R7989 Other specified abnormal findings of blood chemistry: Secondary | ICD-10-CM | POA: Diagnosis not present

## 2017-03-28 DIAGNOSIS — R945 Abnormal results of liver function studies: Principal | ICD-10-CM

## 2017-03-28 LAB — HEPATIC FUNCTION PANEL
ALT: 259 U/L — AB (ref 0–35)
AST: 170 U/L — AB (ref 0–37)
Albumin: 4.3 g/dL (ref 3.5–5.2)
Alkaline Phosphatase: 55 U/L (ref 39–117)
BILIRUBIN TOTAL: 0.6 mg/dL (ref 0.2–1.2)
Bilirubin, Direct: 0.1 mg/dL (ref 0.0–0.3)
TOTAL PROTEIN: 8 g/dL (ref 6.0–8.3)

## 2017-03-28 LAB — PROTIME-INR
INR: 1.2 ratio — ABNORMAL HIGH (ref 0.8–1.0)
Prothrombin Time: 13.3 s — ABNORMAL HIGH (ref 9.6–13.1)

## 2017-03-29 ENCOUNTER — Other Ambulatory Visit: Payer: Self-pay | Admitting: Internal Medicine

## 2017-03-29 LAB — TIQ- AMBIGUOUS ORDER

## 2017-04-03 ENCOUNTER — Ambulatory Visit (INDEPENDENT_AMBULATORY_CARE_PROVIDER_SITE_OTHER): Payer: Medicare HMO | Admitting: Certified Nurse Midwife

## 2017-04-03 ENCOUNTER — Encounter: Payer: Self-pay | Admitting: Certified Nurse Midwife

## 2017-04-03 VITALS — BP 108/68 | HR 72 | Resp 16 | Ht 71.75 in | Wt 214.0 lb

## 2017-04-03 DIAGNOSIS — Z01419 Encounter for gynecological examination (general) (routine) without abnormal findings: Secondary | ICD-10-CM

## 2017-04-03 DIAGNOSIS — N951 Menopausal and female climacteric states: Secondary | ICD-10-CM

## 2017-04-03 LAB — IRON,TIBC AND FERRITIN PANEL
%SAT: 39 % (calc) (ref 11–50)
Ferritin: 476 ng/mL — ABNORMAL HIGH (ref 20–288)
Iron: 142 ug/dL (ref 45–160)
TIBC: 368 ug/dL (ref 250–450)

## 2017-04-03 LAB — MITOCHONDRIAL ANTIBODIES: Mitochondrial M2 Ab, IgG: <=20 U

## 2017-04-03 LAB — ANTI-NUCLEAR AB-TITER (ANA TITER)

## 2017-04-03 LAB — CERULOPLASMIN: Ceruloplasmin: 29 mg/dL (ref 18–53)

## 2017-04-03 LAB — ANA: Anti Nuclear Antibody(ANA): POSITIVE — AB

## 2017-04-03 LAB — SMOOTH MUSC W/REFL: SMOOTH MUSCLE AB SCREEN: NEGATIVE

## 2017-04-03 LAB — ALPHA-1-ANTITRYPSIN: A-1 Antitrypsin, Ser: 158 mg/dL (ref 83–199)

## 2017-04-03 NOTE — Patient Instructions (Addendum)

## 2017-04-03 NOTE — Progress Notes (Signed)
71 y.o. G0P0000 Divorced  Caucasian Fe here for annual exam.  Menopausal no HRT. Denies vaginal dryness or vaginal bleeding. Sees Dr. Larose Kells for aex for GERD/cholesterol and anxiety management and labs. Will be seeing specialist for liver enzyme elevation next month. Staying active, driving and eating healthy. No issues today.  Patient's last menstrual period was 07/10/1996.          Sexually active: No.  The current method of family planning is post menopausal status.    Exercising: No.  exercise Smoker:  no  Health Maintenance: Pap:  01-27-16 neg History of Abnormal Pap: no MMG:  08-03-16 category c density birads 1:neg Self Breast exams: no Colonoscopy:  2015 normal BMD:   2016 normal TDaP:  2008, pt to check with pcp to see if she had update Shingles: 2008 Pneumonia: 2015 Hep C and HIV: both neg 2017 Labs: if needed.   reports that she quit smoking about 45 years ago. Her smoking use included Cigarettes. She quit after 10.00 years of use. She has never used smokeless tobacco. She reports that she drinks about 1.8 - 3.0 oz of alcohol per week . She reports that she does not use drugs.  Past Medical History:  Diagnosis Date  . Anxiety   . Esophageal reflux   . Fatty liver     NASH--s/p liver Bx 2011, early portal fibrosis   . Goiter   . Hiatal hernia   . OSA (obstructive sleep apnea) 12/07/2010   Apnealink 12/01/10>>AHI 9, RDI 10, SpO2 low 82%.   [name] Urethral stricture    uretheral stricture , s/p dilatation per urology ~ 2005  . Vitamin B 12 deficiency    mild   . Vitamin D deficiency     Past Surgical History:  Procedure Laterality Date  . APPENDECTOMY    . BIOPSY THYROID  2016   Consistent w/ benign follicular nodule  . ESOPHAGOGASTRODUODENOSCOPY  2005   [facility]    Current Outpatient Prescriptions  Medication Sig Dispense Refill  . citalopram (CELEXA) 20 MG tablet Take 1 tablet (20 mg total) by mouth daily. 90 tablet 0  . Cobalamine Combinations (B-12) [phone] MCG SUBL      . MILK THISTLE PO Take 1,300 mg by mouth daily.     . Multiple Vitamins-Minerals (MULTIVITAMIN PO) Take by mouth daily.     . RABEprazole (ACIPHEX) 20 MG tablet Take 1 tablet (20 mg total) by mouth daily. 90 tablet 2   No current facility-administered medications for this visit.     Family History  Problem Relation Age of Onset  . Coronary artery disease Maternal Grandfather   . Breast cancer Maternal Aunt   . Scleroderma Mother   . Emphysema Father   . Stroke Brother   . Colon cancer Neg Hx   . Diabetes Neg Hx     ROS:  Pertinent items are noted in HPI.  Otherwise, a comprehensive ROS was negative.  Exam:   BP 108/68   Pulse 72   Resp 16   Ht 5' 11.75" (1.822 m)   Wt 214 lb (97.1 kg)   LMP 07/10/1996   BMI 29.23 kg/m  Height: 5' 11.75" (182.2 cm) Ht Readings from Last 3 Encounters:  04/03/17 5' 11.75" (1.822 m)  07/06/16 5' 11.75" (1.822 m)  06/28/16 5' 11.75" (1.822 m)    General appearance: alert, cooperative and appears stated age Head: Normocephalic, without obvious abnormality, atraumatic Neck: no adenopathy, supple, symmetrical, trachea midline and thyroid normal to inspection and  palpation Lungs: clear to auscultation bilaterally Breasts: normal appearance, no masses or tenderness, No nipple retraction or dimpling, No nipple discharge or bleeding, No axillary or supraclavicular adenopathy Heart: regular rate and rhythm Abdomen: soft, non-tender; no masses,  no organomegaly Extremities: extremities normal, atraumatic, no cyanosis or edema Skin: Skin color, texture, turgor normal. No rashes or lesions Lymph nodes: Cervical, supraclavicular, and axillary nodes normal. No abnormal inguinal nodes palpated Neurologic: Grossly normal   Pelvic: External genitalia:  no lesions              Urethra:  normal appearing urethra with no masses, tenderness or lesions              Bartholin's and Skene's: normal                 Vagina: normal appearing vagina with normal  color and discharge, no lesions              Cervix: no cervical motion tenderness, no lesions and normal appearance              Pap taken: No. Bimanual Exam:  Uterus:  normal size, contour, position, consistency, mobility, non-tender              Adnexa: normal adnexa and no mass, fullness, tenderness               Rectovaginal: Confirms               Anus:  normal sphincter tone, no lesions  Chaperone present: yes  A:  Well Woman with normal exam  Menopausal  No HRT  Vaginal dryness  GERD,Cholesterol/anxiety management with PCP  P:   Reviewed health and wellness pertinent to exam  Aware of need to evaluate if vaginal bleeding  Discussed option for treatment, prefers OTC coconut trial or Olive oil. Will advise if no change  Pap smear: no  counseled on breast self exam, mammography screening, adequate intake of calcium and vitamin D, diet and exercise  return annually or prn  An After Visit Summary was printed and given to the patient.

## 2017-04-23 ENCOUNTER — Encounter: Payer: Self-pay | Admitting: Internal Medicine

## 2017-04-26 DIAGNOSIS — R69 Illness, unspecified: Secondary | ICD-10-CM | POA: Diagnosis not present

## 2017-05-15 ENCOUNTER — Other Ambulatory Visit: Payer: Self-pay | Admitting: Internal Medicine

## 2017-05-18 DIAGNOSIS — D225 Melanocytic nevi of trunk: Secondary | ICD-10-CM | POA: Diagnosis not present

## 2017-05-18 DIAGNOSIS — L570 Actinic keratosis: Secondary | ICD-10-CM | POA: Diagnosis not present

## 2017-05-18 DIAGNOSIS — L821 Other seborrheic keratosis: Secondary | ICD-10-CM | POA: Diagnosis not present

## 2017-05-18 DIAGNOSIS — L814 Other melanin hyperpigmentation: Secondary | ICD-10-CM | POA: Diagnosis not present

## 2017-05-24 ENCOUNTER — Ambulatory Visit: Payer: Medicare HMO | Admitting: Internal Medicine

## 2017-05-24 ENCOUNTER — Encounter: Payer: Self-pay | Admitting: Internal Medicine

## 2017-05-24 VITALS — BP 110/70 | HR 56 | Ht 71.75 in | Wt 213.8 lb

## 2017-05-24 DIAGNOSIS — K7581 Nonalcoholic steatohepatitis (NASH): Secondary | ICD-10-CM

## 2017-05-24 DIAGNOSIS — K760 Fatty (change of) liver, not elsewhere classified: Secondary | ICD-10-CM | POA: Diagnosis not present

## 2017-05-24 NOTE — Patient Instructions (Signed)
You have been scheduled for an abdominal ultrasound at [facility] (1st floor of hospital) on 05/28/2017 at 10:30am. Please arrive 15 minutes prior to your appointment for registration. Make certain not to have anything to eat or drink after midnight prior to your appointment. Should you need to reschedule your appointment, please contact radiology at [phone]. This test typically takes about 30 minutes to perform.   Please follow up in one year

## 2017-05-24 NOTE — Progress Notes (Signed)
HISTORY OF PRESENT ILLNESS:  Emily Parrish is a 71 y.o. female with a history of NASH with fibrosis on previous liver biopsy (previous patient of Emily Parrish) with chronic elevation of liver tests. Last seen in this office by the GI physician assistant regarding elevated liver tests and her liver disease. She was to have follow-up blood work as directed but did not until September of this year. Blood work assessing for other causes of liver disease was either normal or unremarkable/insignificant. She continues to have difficulties losing weight. She is on no particular dye. No exercise. Review of most recent blood work from September shows AST 170, ALT 259, with normal alkaline phosphatase and bilirubin. Normal iron saturation, ceruloplasmin, alpha-1 antitrypsin, prothrombin time (1.2), thyroid studies. Platelets from 2017 141,000. Weakly positive ANA 1:80. Normal antimitochondrial antibody and smooth muscle antibody. Abdominal ultrasound January 2018 revealed hepatic steatosis and a right liver cyst measuring 2.2 cm. Otherwise negative. Last colonoscopy for routine screening October 2015 was negative and she normal). She denies problems with mentation, edema, or bleeding. She does take milk thistle  REVIEW OF SYSTEMS:  All non-GI ROS negative unless otherwise stated in the history of present illness except for itching  Past Medical History:  Diagnosis Date  . Anxiety   . Esophageal reflux   . Fatty liver     NASH--s/p liver Bx 2011, early portal fibrosis   . Goiter   . Hiatal hernia   . OSA (obstructive sleep apnea) 12/07/2010   Apnealink 12/01/10>>AHI 9, RDI 10, SpO2 low 82%.   [name] Urethral stricture    uretheral stricture , s/p dilatation per urology ~ 2005  . Vitamin B 12 deficiency    mild   . Vitamin D deficiency     Past Surgical History:  Procedure Laterality Date  . APPENDECTOMY    . BIOPSY THYROID  2016   Consistent w/ benign follicular nodule  .  ESOPHAGOGASTRODUODENOSCOPY  2005   [place]    Social History Emily Parrish  reports that she quit smoking about 45 years ago. Her smoking use included cigarettes. She quit after 10.00 years of use. she has never used smokeless tobacco. She reports that she drinks about 3.0 - 4.2 oz of alcohol per week. She reports that she does not use drugs.  family history includes Alcohol abuse in her father; Breast cancer in her maternal aunt; Coronary artery disease in her maternal grandfather; Emphysema in her father; Scleroderma in her mother; Stroke in her brother.  Allergies  Allergen Reactions  . Fluticasone-Salmeterol     REACTION: rapid heart rate, medication is inhalent       PHYSICAL EXAMINATION: Vital signs: BP 110/70   Pulse (!) 56   Ht 5' 11.75" (1.822 m)   Wt 213 lb 12.8 oz (97 kg)   LMP 07/10/1996   BMI 29.20 kg/m   Constitutional: Pleasant, generally well-appearing, no acute distress Psychiatric: alert and oriented x3, cooperative Eyes: extraocular movements intact, anicteric, conjunctiva pink Mouth: oral pharynx moist, no lesions Neck: supple no lymphadenopathy Cardiovascular: heart regular rate and rhythm, no murmur Lungs: clear to auscultation bilaterally Abdomen: soft, obese, nontender, nondistended, no obvious ascites, no peritoneal signs, normal bowel sounds, no organomegaly Rectal: Omitted Extremities: no clubbing, cyanosis, or lower extremity edema bilaterally Skin: no lesions on visible extremities Neuro: No focal deficits. Cranial nerves intact, No asterixis.    ASSESSMENT:  #1. NASH with fibrosis on previous biopsy. No evidence for overt cirrhosis but previous mild depression and platelets  noted . Concerns over progression to cirrhosis and its complications strictly stressed. She acknowledges understanding #2. Normal colonoscopy October 2015 #3. Morbid obesity #4. GERD. On AcipHex. No symptoms with medication    PLAN:  #1. Reviewed laboratory workup  with patient and radiology assessment #2. Exercise #3. Weight loss #4. Schedule right upper quadrant ultrasound to evaluate the liver screen for [place] #5. Routine office follow-up one year. Repeat laboratories and x-ray that time  25 minutes spent face-to-face with the patient. Greater than 50% a time use for counseling regarding her fatty liver disease, its potential complications, and his current management strategy as well as surveillance

## 2017-05-28 ENCOUNTER — Ambulatory Visit (HOSPITAL_COMMUNITY)
Admission: RE | Admit: 2017-05-28 | Discharge: 2017-05-28 | Disposition: A | Discharge status: Home / Self Care | Payer: Medicare HMO | Source: Ambulatory Visit | Attending: Internal Medicine | Admitting: Internal Medicine

## 2017-05-28 DIAGNOSIS — K7689 Other specified diseases of liver: Secondary | ICD-10-CM | POA: Insufficient documentation

## 2017-05-28 DIAGNOSIS — K760 Fatty (change of) liver, not elsewhere classified: Secondary | ICD-10-CM

## 2017-06-12 ENCOUNTER — Encounter: Payer: Self-pay | Admitting: Internal Medicine

## 2017-06-12 ENCOUNTER — Ambulatory Visit (INDEPENDENT_AMBULATORY_CARE_PROVIDER_SITE_OTHER): Payer: Medicare HMO | Admitting: Internal Medicine

## 2017-06-12 VITALS — BP 126/76 | HR 83 | Temp 97.6°F | Resp 14 | Ht 72.0 in | Wt 214.5 lb

## 2017-06-12 DIAGNOSIS — E538 Deficiency of other specified B group vitamins: Secondary | ICD-10-CM

## 2017-06-12 DIAGNOSIS — E785 Hyperlipidemia, unspecified: Secondary | ICD-10-CM

## 2017-06-12 DIAGNOSIS — Z0000 Encounter for general adult medical examination without abnormal findings: Secondary | ICD-10-CM | POA: Diagnosis not present

## 2017-06-12 DIAGNOSIS — E041 Nontoxic single thyroid nodule: Secondary | ICD-10-CM

## 2017-06-12 DIAGNOSIS — Z23 Encounter for immunization: Secondary | ICD-10-CM | POA: Diagnosis not present

## 2017-06-12 NOTE — Progress Notes (Signed)
Pre visit review using our clinic review tool, if applicable. No additional management support is needed unless otherwise documented below in the visit note. 

## 2017-06-12 NOTE — Assessment & Plan Note (Addendum)
-  Td 06-2017 ; pneumonia shot 12-09; prevnar -- 2015; zostavax 2009; shingrex discussed; had a Flu shot  - female care per gyn,  PAPs, DEXA- MMG-- per gyn -CCS: Cscope 2005 and 2015 neg, 10 years  - Palpable Ao --- 04-2014 u/s no AAA - Labs: BMP, FLP, CBC, TSH, T3, T4, B12 - diet, exercise: discussed  -Medicare wellness encouraged

## 2017-06-12 NOTE — Progress Notes (Signed)
Subjective:    Patient ID: Emily Parrish, female    DOB: 02/15/1946, 71 y.o.   MRN: [id number]  DOS:  06/12/2017 Type of visit - description : cpx Interval history: No major concerns, chart reviewed.   Review of Systems  A 14 point review of systems is negative    Past Medical History:  Diagnosis Date  . Anxiety   . Esophageal reflux   . Fatty liver     NASH--s/p liver Bx 2011, early portal fibrosis   . Goiter   . Hiatal hernia   . OSA (obstructive sleep apnea) 12/07/2010   Apnealink 12/01/10>>AHI 9, RDI 10, SpO2 low 82%.   [name] Urethral stricture    uretheral stricture , s/p dilatation per urology ~ 2005  . Vitamin B 12 deficiency    mild   . Vitamin D deficiency     Past Surgical History:  Procedure Laterality Date  . APPENDECTOMY    . BIOPSY THYROID  2016   Consistent w/ benign follicular nodule  . ESOPHAGOGASTRODUODENOSCOPY  2005   [place]    Social History   Socioeconomic History  . Marital status: Divorced    Spouse name: Not on file  . Number of children: 0  . Years of education: college  . Highest education level: Not on file  Social Needs  . Financial resource strain: Not on file  . Food insecurity - worry: Not on file  . Food insecurity - inability: Not on file  . Transportation needs - medical: Not on file  . Transportation needs - non-medical: Not on file  Occupational History  . 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 Smoking status: Former Smoker    Years: 10.00    Types: Cigarettes    Last attempt to quit: 07/11/1971    Years since quitting: 45.9  . Smokeless tobacco: Never Used  . Tobacco comment: used to smoke--1 1/2 pack per week  Substance and Sexual Activity  . Alcohol use: Yes    Alcohol/week: 3.0 - 4.2 oz    Types: 5 - 7 Glasses of wine per week    Comment: social  . Drug use: No  . Sexual activity: No    Partners: Male    Birth control/protection: Post-menopausal  Other  Topics Concern  . Not on file  Social History Narrative   Lives by herself , no family in town   emergency contact Graceann Congress [phone])   Drinks about 1-2 caffeine drinks a day      Family History  Problem Relation Age of Onset  . Coronary artery disease Maternal Grandfather   . Breast cancer Maternal Aunt        died at age 29  . Scleroderma Mother   . Emphysema Father   . Alcohol abuse Father   . Stroke Brother   . Colon cancer Neg Hx   . Diabetes Neg Hx      Allergies as of 06/12/2017      Reactions   Fluticasone-salmeterol    REACTION: rapid heart rate, medication is inhalent      Medication List        Accurate as of 06/12/17 11:59 PM. Always use your most recent med list.          B-12 [phone] MCG Subl   citalopram 20 MG tablet Commonly known as:  CELEXA Take 1 tablet (20 mg total) by mouth daily.   MILK THISTLE PO Take  1,300 mg by mouth daily.   MULTIVITAMIN PO Take by mouth daily.   RABEprazole 20 MG tablet Commonly known as:  ACIPHEX Take 1 tablet (20 mg total) by mouth daily.          Objective:   Physical Exam BP 126/76 (BP Location: Left Arm, Patient Position: Sitting, Cuff Size: Normal)   Pulse 83   Temp 97.6 F (36.4 C) (Oral)   Resp 14   Ht 6' (1.829 m)   Wt 214 lb 8 oz (97.3 kg)   LMP 07/10/1996   SpO2 98%   BMI 29.09 kg/m   General:   Well developed, well nourished . NAD.  Neck: Thyroid gland palpable on the right side when the patient swallows, no nodular or tender. HEENT:  Normocephalic . Face symmetric, atraumatic Lungs:  CTA B Normal respiratory effort, no intercostal retractions, no accessory muscle use. Heart: RRR,  no murmur.  No pretibial edema bilaterally  Abdomen:  Not distended, soft, non-tender. No rebound or rigidity.   Skin: Exposed areas without rash. Not pale. Not jaundice Neurologic:  alert & oriented X3.  Speech normal, gait appropriate for age and unassisted Strength symmetric and appropriate for  age.  Psych: Cognition and judgment appear intact.  Cooperative with normal attention span and concentration.  Behavior appropriate. No anxious or depressed appearing.     Assessment & Plan:   Assessment   Anxiety Mild dyslipidemia Goiter, had one (-) FNA before, last [place] 04-2014, 04-2015 FNA (-)  GI: --GERD, HH --Fatty liver---NASH--s/p liver Bx 2011, early portal fibrosis  OSA, on CPAP -Apnealink 12/01/10>>AHI 9, RDI 10, SpO2 low 82%.  -saw neuro for mngmt 06-2016 H/o urethral stricture-- s/p dilatation, urology B12 and Vit D deficiency  PLAN: Anxiety: On citalopram, well controlled.  No change Dyslipidemia: Diet controlled, checking labs. Goiter: Right-sided enlargement consistent with previous clinical exam, last FNA 04-2015.  Consider a [place] next year Fatty liver: Per GI OSA: On CPAP.  Saw neurology 06/2016, they noted her OSA was mild, weight loss could  lead to being able to stop CPAP. B12 deficiency: On oral supplements, checking labs. RTC 1 year.

## 2017-06-12 NOTE — Patient Instructions (Signed)
GO TO THE LAB : Get the blood work     GO TO THE FRONT DESK Schedule labs to be done within few days, fasting  Schedule your next appointment for a physical exam in 1 year  Consider a Medicare wellness exam with one of our nurses  Consider a gradual exercise program

## 2017-06-13 ENCOUNTER — Other Ambulatory Visit (INDEPENDENT_AMBULATORY_CARE_PROVIDER_SITE_OTHER): Payer: Medicare HMO

## 2017-06-13 ENCOUNTER — Other Ambulatory Visit: Payer: Self-pay

## 2017-06-13 DIAGNOSIS — E041 Nontoxic single thyroid nodule: Secondary | ICD-10-CM | POA: Diagnosis not present

## 2017-06-13 DIAGNOSIS — Z0000 Encounter for general adult medical examination without abnormal findings: Secondary | ICD-10-CM | POA: Diagnosis not present

## 2017-06-13 DIAGNOSIS — E538 Deficiency of other specified B group vitamins: Secondary | ICD-10-CM | POA: Diagnosis not present

## 2017-06-13 DIAGNOSIS — E785 Hyperlipidemia, unspecified: Secondary | ICD-10-CM | POA: Diagnosis not present

## 2017-06-13 DIAGNOSIS — R7989 Other specified abnormal findings of blood chemistry: Secondary | ICD-10-CM

## 2017-06-13 LAB — BASIC METABOLIC PANEL
BUN: 17 mg/dL (ref 6–23)
CALCIUM: 9.3 mg/dL (ref 8.4–10.5)
CO2: 29 mEq/L (ref 19–32)
Chloride: 104 mEq/L (ref 96–112)
Creatinine, Ser: 0.9 mg/dL (ref 0.40–1.20)
GFR: 65.24 mL/min (ref >60.00)
Glucose, Bld: 106 mg/dL — ABNORMAL HIGH (ref 70–99)
POTASSIUM: 4 meq/L (ref 3.5–5.1)
SODIUM: 140 meq/L (ref 135–145)

## 2017-06-13 LAB — T3, FREE: T3, Free: 3.2 pg/mL (ref 2.3–4.2)

## 2017-06-13 LAB — CBC WITH DIFFERENTIAL/PLATELET
BASOS PCT: 0.7 % (ref 0.0–3.0)
Basophils Absolute: 0 10*3/uL (ref 0.0–0.1)
EOS PCT: 2.9 % (ref 0.0–5.0)
Eosinophils Absolute: 0.1 10*3/uL (ref 0.0–0.7)
HCT: 41.4 % (ref 36.0–46.0)
HEMOGLOBIN: 13.8 g/dL (ref 12.0–15.0)
LYMPHS ABS: 1.5 10*3/uL (ref 0.7–4.0)
Lymphocytes Relative: 38.2 % (ref 12.0–46.0)
MCHC: 33.5 g/dL (ref 30.0–36.0)
MCV: 98.5 fl (ref 78.0–100.0)
MONO ABS: 0.3 10*3/uL (ref 0.1–1.0)
MONOS PCT: 6.8 % (ref 3.0–12.0)
Neutro Abs: 2 10*3/uL (ref 1.4–7.7)
Neutrophils Relative %: 51.4 % (ref 43.0–77.0)
Platelets: 132 10*3/uL — ABNORMAL LOW (ref 150.0–400.0)
RBC: 4.2 Mil/uL (ref 3.87–5.11)
RDW: 13 % (ref 11.5–15.5)
WBC: 3.8 10*3/uL — AB (ref 4.0–10.5)

## 2017-06-13 LAB — LIPID PANEL
CHOLESTEROL: 208 mg/dL — AB (ref 0–200)
HDL: 41.4 mg/dL (ref >39.00)
LDL CALC: 131 mg/dL — AB (ref 0–99)
NONHDL: 166.23
Total CHOL/HDL Ratio: 5
Triglycerides: 177 mg/dL — ABNORMAL HIGH (ref 0.0–149.0)
VLDL: 35.4 mg/dL (ref 0.0–40.0)

## 2017-06-13 LAB — VITAMIN B12: VITAMIN B 12: 1164 pg/mL — AB (ref 211–911)

## 2017-06-13 LAB — TSH: TSH: 6.55 u[IU]/mL — AB (ref 0.35–4.50)

## 2017-06-13 LAB — T4, FREE: Free T4: 0.68 ng/dL (ref 0.60–1.60)

## 2017-06-13 NOTE — Assessment & Plan Note (Signed)
Anxiety: On citalopram, well controlled.  No change Dyslipidemia: Diet controlled, checking labs. Goiter: Right-sided enlargement consistent with previous clinical exam, last FNA 04-2015.  Consider a [place] next year Fatty liver: Per GI OSA: On CPAP.  Saw neurology 06/2016, they noted her OSA was mild, weight loss could  lead to being able to stop CPAP. B12 deficiency: On oral supplements, checking labs. RTC 1 year.

## 2017-07-24 ENCOUNTER — Other Ambulatory Visit: Payer: Self-pay | Admitting: Internal Medicine

## 2017-08-06 DIAGNOSIS — Z1231 Encounter for screening mammogram for malignant neoplasm of breast: Secondary | ICD-10-CM | POA: Diagnosis not present

## 2017-08-22 ENCOUNTER — Telehealth: Payer: Self-pay

## 2017-08-22 NOTE — Telephone Encounter (Signed)
Rabeprazole Sodium approved by [employer].  07/09/2017 to 12/331/2019

## 2017-08-29 DIAGNOSIS — H04123 Dry eye syndrome of bilateral lacrimal glands: Secondary | ICD-10-CM | POA: Diagnosis not present

## 2017-08-29 DIAGNOSIS — Z9889 Other specified postprocedural states: Secondary | ICD-10-CM | POA: Diagnosis not present

## 2017-08-29 DIAGNOSIS — H2513 Age-related nuclear cataract, bilateral: Secondary | ICD-10-CM | POA: Diagnosis not present

## 2017-11-06 DIAGNOSIS — R69 Illness, unspecified: Secondary | ICD-10-CM | POA: Diagnosis not present

## 2017-11-08 ENCOUNTER — Other Ambulatory Visit: Payer: Self-pay | Admitting: Internal Medicine

## 2017-12-19 ENCOUNTER — Encounter: Payer: Self-pay | Admitting: Internal Medicine

## 2018-02-06 DIAGNOSIS — R32 Unspecified urinary incontinence: Secondary | ICD-10-CM | POA: Diagnosis not present

## 2018-02-06 DIAGNOSIS — M1990 Unspecified osteoarthritis, unspecified site: Secondary | ICD-10-CM | POA: Diagnosis not present

## 2018-02-06 DIAGNOSIS — K219 Gastro-esophageal reflux disease without esophagitis: Secondary | ICD-10-CM | POA: Diagnosis not present

## 2018-02-06 DIAGNOSIS — Z7722 Contact with and (suspected) exposure to environmental tobacco smoke (acute) (chronic): Secondary | ICD-10-CM | POA: Diagnosis not present

## 2018-02-06 DIAGNOSIS — H547 Unspecified visual loss: Secondary | ICD-10-CM | POA: Diagnosis not present

## 2018-02-06 DIAGNOSIS — Z87891 Personal history of nicotine dependence: Secondary | ICD-10-CM | POA: Diagnosis not present

## 2018-02-06 DIAGNOSIS — Z823 Family history of stroke: Secondary | ICD-10-CM | POA: Diagnosis not present

## 2018-02-06 DIAGNOSIS — F419 Anxiety disorder, unspecified: Secondary | ICD-10-CM | POA: Diagnosis not present

## 2018-02-06 DIAGNOSIS — R69 Illness, unspecified: Secondary | ICD-10-CM | POA: Diagnosis not present

## 2018-02-06 DIAGNOSIS — H269 Unspecified cataract: Secondary | ICD-10-CM | POA: Diagnosis not present

## 2018-02-27 ENCOUNTER — Encounter: Payer: Self-pay | Admitting: Family Medicine

## 2018-02-27 ENCOUNTER — Ambulatory Visit (INDEPENDENT_AMBULATORY_CARE_PROVIDER_SITE_OTHER): Payer: Medicare HMO | Admitting: Family Medicine

## 2018-02-27 VITALS — BP 122/80 | HR 87 | Temp 98.1°F | Ht 72.0 in | Wt 191.5 lb

## 2018-02-27 DIAGNOSIS — R239 Unspecified skin changes: Secondary | ICD-10-CM | POA: Diagnosis not present

## 2018-02-27 MED ORDER — LEVOCETIRIZINE DIHYDROCHLORIDE 5 MG PO TABS: 5.0000 mg | ORAL_TABLET | Freq: Every evening | ORAL | 0 refills | Status: DC

## 2018-02-27 NOTE — Progress Notes (Signed)
CC: rash  Emily Parrish is a 72 y.o. female here for a skin complaint.  Duration: 3 days Bothers her mainly at night. Location: hands, trunk, feet Pruritic? Yes Painful? No Drainage? No New soaps/lotions/topicals/detergents? No Sick contacts? No Other associated symptoms: none Therapies tried thus far: Benadryl  ROS:  Const: No fevers Skin: As noted in HPI  Past Medical History:  Diagnosis Date  . Anxiety   . Esophageal reflux   . Fatty liver     NASH--s/p liver Bx 2011, early portal fibrosis   . Goiter   . Hiatal hernia   . OSA (obstructive sleep apnea) 12/07/2010   Apnealink 12/01/10>>AHI 9, RDI 10, SpO2 low 82%.   [name] Urethral stricture    uretheral stricture , s/p dilatation per urology ~ 2005  . Vitamin B 12 deficiency    mild   . Vitamin D deficiency    BP 122/80 (BP Location: Left Arm, Patient Position: Sitting, Cuff Size: Normal)   Pulse 87   Temp 98.1 F (36.7 C) (Oral)   Ht 6' (1.829 m)   Wt 191 lb 8 oz (86.9 kg)   LMP 07/10/1996   SpO2 97%   BMI 25.97 kg/m  Gen: awake, alert, appearing stated age Lungs: No accessory muscle use. CTAB Heart: RRR HEENT: MMM, pharynx without asymmetric Neck: No edema or asymmetry Skin: no current skin lesions. Psych: Age appropriate judgment and insight  Skin complaints - Plan: levocetirizine (XYZAL) 5 MG tablet  Orders as above. Sounds like urticaria. Try PO antihistamine for 10 d. If no improvement, return to clinic to discuss allergy referral vs basic allergy panel testing.  The patient voiced understanding and agreement to the plan.  [place], DO 02/27/18 11:45 AM

## 2018-02-27 NOTE — Progress Notes (Signed)
Pre visit review using our clinic review tool, if applicable. No additional management support is needed unless otherwise documented below in the visit note. 

## 2018-02-27 NOTE — Patient Instructions (Signed)
Claritin (loratadine), Allegra (fexofenadine), Zyrtec (cetirizine); these are listed in order from weakest to strongest. Generic, and therefore cheaper, options are in the parentheses.   Do this for the next 10-14 days.  There are available OTC, and the generic versions, which may be cheaper, are in parentheses. Show this to a pharmacist if you have trouble finding any of these items.  If no improvement, take pictures and follow up with our office.   Let us know if you need anything.

## 2018-04-04 ENCOUNTER — Other Ambulatory Visit: Payer: Self-pay

## 2018-04-04 ENCOUNTER — Ambulatory Visit (INDEPENDENT_AMBULATORY_CARE_PROVIDER_SITE_OTHER): Payer: Medicare HMO | Admitting: Certified Nurse Midwife

## 2018-04-04 ENCOUNTER — Encounter: Payer: Self-pay | Admitting: Certified Nurse Midwife

## 2018-04-04 ENCOUNTER — Other Ambulatory Visit (HOSPITAL_COMMUNITY)
Admission: RE | Admit: 2018-04-04 | Discharge: 2018-04-04 | Disposition: A | Discharge status: Home / Self Care | Payer: Medicare HMO | Source: Ambulatory Visit | Attending: Obstetrics & Gynecology | Admitting: Obstetrics & Gynecology

## 2018-04-04 VITALS — BP 100/64 | HR 68 | Resp 16 | Ht 71.25 in | Wt 197.0 lb

## 2018-04-04 DIAGNOSIS — N951 Menopausal and female climacteric states: Secondary | ICD-10-CM | POA: Diagnosis not present

## 2018-04-04 DIAGNOSIS — Z01419 Encounter for gynecological examination (general) (routine) without abnormal findings: Secondary | ICD-10-CM

## 2018-04-04 DIAGNOSIS — Z124 Encounter for screening for malignant neoplasm of cervix: Secondary | ICD-10-CM

## 2018-04-04 DIAGNOSIS — Z01411 Encounter for gynecological examination (general) (routine) with abnormal findings: Secondary | ICD-10-CM

## 2018-04-04 DIAGNOSIS — B369 Superficial mycosis, unspecified: Secondary | ICD-10-CM

## 2018-04-04 NOTE — Progress Notes (Signed)
72 y.o. G0P0000 Divorced  Caucasian Fe here for annual exam. Menopausal denies vaginal bleeding. Not aware of dryness if present, not sexually active. Staying active with regular exercise and being social. Eating well and working on weight loss at present.  Sees Dr. Larose Kells for labs, anxiety and GERD medication. All stable per patient. No health issues today. Planning beach vacation soon.  Patient's last menstrual period was 07/10/1996.          Sexually active: No.  The current method of family planning is post menopausal status.    Exercising: Yes.    cardio Smoker:  no  Review of Systems  Constitutional: Negative.   HENT: Negative.   Eyes: Negative.   Respiratory: Negative.   Cardiovascular: Negative.   Gastrointestinal: Negative.   Genitourinary: Negative.   Musculoskeletal: Negative.   Skin: Negative.   Neurological: Negative.   Endo/Heme/Allergies: Negative.   Psychiatric/Behavioral: Negative.     Health Maintenance: Pap:  01-27-16 neg History of Abnormal Pap: no MMG: 2019 waiting on fax Self Breast exams: no Colonoscopy:  2015 BMD:   2016  Repeat needed TDaP:  2018 Shingles: 2008 Pneumonia: 2017 Hep C and HIV: both neg 2017 Labs: with PCP   reports that she quit smoking about 46 years ago. Her smoking use included cigarettes. She quit after 10.00 years of use. She has never used smokeless tobacco. She reports that she drinks about 5.0 - 7.0 standard drinks of alcohol per week. She reports that she does not use drugs.  Past Medical History:  Diagnosis Date  . Anxiety   . Esophageal reflux   . Fatty liver     NASH--s/p liver Bx 2011, early portal fibrosis   . Goiter   . Hiatal hernia   . OSA (obstructive sleep apnea) 12/07/2010   Apnealink 12/01/10>>AHI 9, RDI 10, SpO2 low 82%.   [name] Urethral stricture    uretheral stricture , s/p dilatation per urology ~ 2005  . Vitamin B 12 deficiency    mild   . Vitamin D deficiency     Past Surgical History:  Procedure  Laterality Date  . APPENDECTOMY    . BIOPSY THYROID  2016   Consistent w/ benign follicular nodule  . ESOPHAGOGASTRODUODENOSCOPY  2005   [facility]    Current Outpatient Medications  Medication Sig Dispense Refill  . citalopram (CELEXA) 20 MG tablet Take 1 tablet (20 mg total) by mouth daily. 90 tablet 3  . Cobalamine Combinations (B-12) [phone] MCG SUBL     . levocetirizine (XYZAL) 5 MG tablet Take 1 tablet (5 mg total) by mouth every evening. 30 tablet 0  . MILK THISTLE PO Take 1,300 mg by mouth daily.     . Multiple Vitamins-Minerals (MULTIVITAMIN PO) Take by mouth daily.     . RABEprazole (ACIPHEX) 20 MG tablet TAKE 1 TABLET DAILY 90 tablet 1   No current facility-administered medications for this visit.     Family History  Problem Relation Age of Onset  . Coronary artery disease Maternal Grandfather   . Breast cancer Maternal Aunt        died at age 52  . Scleroderma Mother   . Emphysema Father   . Alcohol abuse Father   . Stroke Brother   . Colon cancer Neg Hx   . Diabetes Neg Hx     ROS:  Pertinent items are noted in HPI.  Otherwise, a comprehensive ROS was negative.  Exam:   LMP 07/10/1996    Ht Readings from  Last 3 Encounters:  02/27/18 6' (1.829 m)  06/12/17 6' (1.829 m)  05/24/17 5' 11.75" (1.822 m)    General appearance: alert, cooperative and appears stated age Head: Normocephalic, without obvious abnormality, atraumatic Neck: no adenopathy, supple, symmetrical, trachea midline and thyroid normal to inspection and palpation Lungs: clear to auscultation bilaterally Breasts: normal appearance, no masses or tenderness, No nipple retraction or dimpling, No nipple discharge or bleeding, No axillary or supraclavicular adenopathy Heart: regular rate and rhythm Abdomen: soft, non-tender; no masses,  no organomegaly Extremities: extremities normal, atraumatic, no cyanosis or edema Skin: Skin color, texture, turgor normal. No rashes or lesions. Upper back small 2 cm  powder appearing circle, no redness, slight raised and scale texture, non tender, no pigmentation, Had patient feel area, had not noted, because not seen Lymph nodes: Cervical, supraclavicular, and axillary nodes normal. No abnormal inguinal nodes palpated Neurologic: Grossly normal   Pelvic: External genitalia:  no lesions, normal female, atrophic              Urethra:  normal appearing urethra with no masses, tenderness or lesions              Bartholin's and Skene's: normal                 Vagina: atrophic  appearing vagina with normal color and  Scant discharge, no lesions              Cervix: no cervical motion tenderness, no lesions and normal appearance              Pap taken: Yes.   Bimanual Exam:  Uterus:  normal size, contour, position, consistency, mobility, non-tender and anteverted              Adnexa: normal adnexa and no mass, fullness, tenderness               Rectovaginal: Confirms               Anus:  normal sphincter tone, no lesions  Chaperone present: yes  A:  Well Woman with normal exam  Menopausal with vaginal dryness changes not sexually active  Height loss due for BMD  Skin lesion versus yeast dermatitis  MD management of anxiety and GERD    P:   Reviewed health and wellness pertinent to exam  Discussed vaginal dryness and coconut or Olive oil good choice for vaginal health.  Discussed height loss and need to schedule BMD, Patient will call. Order to be faxed to [facility]  Discussed finding on back and to treat for 5-7 days bid with Monistat derm or clotrimazole, if still present will come in or will see dermatology.  Continue MD follow up as needed.  Pap smear: yes   counseled on breast self exam, mammography screening, feminine hygiene, adequate intake of calcium and vitamin D, diet and exercise  return annually or prn  An After Visit Summary was printed and given to the patient.

## 2018-04-04 NOTE — Patient Instructions (Signed)

## 2018-04-08 ENCOUNTER — Encounter: Payer: Self-pay | Admitting: Certified Nurse Midwife

## 2018-04-09 LAB — CYTOLOGY - PAP: HPV (WINDOPATH): NOT DETECTED

## 2018-04-29 ENCOUNTER — Other Ambulatory Visit: Payer: Self-pay | Admitting: Internal Medicine

## 2018-05-18 DIAGNOSIS — R69 Illness, unspecified: Secondary | ICD-10-CM | POA: Diagnosis not present

## 2018-05-30 DIAGNOSIS — R69 Illness, unspecified: Secondary | ICD-10-CM | POA: Diagnosis not present

## 2018-06-03 DIAGNOSIS — M25512 Pain in left shoulder: Secondary | ICD-10-CM | POA: Diagnosis not present

## 2018-06-03 DIAGNOSIS — M6281 Muscle weakness (generalized): Secondary | ICD-10-CM | POA: Diagnosis not present

## 2018-06-10 DIAGNOSIS — M25512 Pain in left shoulder: Secondary | ICD-10-CM | POA: Diagnosis not present

## 2018-06-10 DIAGNOSIS — M6281 Muscle weakness (generalized): Secondary | ICD-10-CM | POA: Diagnosis not present

## 2018-06-13 DIAGNOSIS — M25512 Pain in left shoulder: Secondary | ICD-10-CM | POA: Diagnosis not present

## 2018-06-13 DIAGNOSIS — M6281 Muscle weakness (generalized): Secondary | ICD-10-CM | POA: Diagnosis not present

## 2018-06-18 DIAGNOSIS — M6281 Muscle weakness (generalized): Secondary | ICD-10-CM | POA: Diagnosis not present

## 2018-06-18 DIAGNOSIS — M25512 Pain in left shoulder: Secondary | ICD-10-CM | POA: Diagnosis not present

## 2018-06-19 ENCOUNTER — Ambulatory Visit (INDEPENDENT_AMBULATORY_CARE_PROVIDER_SITE_OTHER): Payer: Medicare HMO | Admitting: Family Medicine

## 2018-06-19 ENCOUNTER — Encounter: Payer: Self-pay | Admitting: Family Medicine

## 2018-06-19 VITALS — BP 120/68 | HR 71 | Temp 97.9°F | Ht 71.5 in | Wt 194.0 lb

## 2018-06-19 DIAGNOSIS — Z0000 Encounter for general adult medical examination without abnormal findings: Secondary | ICD-10-CM | POA: Diagnosis not present

## 2018-06-19 LAB — COMPREHENSIVE METABOLIC PANEL
ALT: 53 U/L — ABNORMAL HIGH (ref 0–35)
AST: 40 U/L — ABNORMAL HIGH (ref 0–37)
Albumin: 4.3 g/dL (ref 3.5–5.2)
Alkaline Phosphatase: 42 U/L (ref 39–117)
BUN: 20 mg/dL (ref 6–23)
CALCIUM: 9.3 mg/dL (ref 8.4–10.5)
CHLORIDE: 105 meq/L (ref 96–112)
CO2: 27 mEq/L (ref 19–32)
Creatinine, Ser: 1.01 mg/dL (ref 0.40–1.20)
GFR: 56.95 mL/min — AB (ref >60.00)
GLUCOSE: 110 mg/dL — AB (ref 70–99)
Potassium: 4 mEq/L (ref 3.5–5.1)
Sodium: 140 mEq/L (ref 135–145)
TOTAL PROTEIN: 7.3 g/dL (ref 6.0–8.3)
Total Bilirubin: 0.5 mg/dL (ref 0.2–1.2)

## 2018-06-19 LAB — LIPID PANEL
CHOL/HDL RATIO: 5
CHOLESTEROL: 225 mg/dL — AB (ref 0–200)
HDL: 46.6 mg/dL (ref >39.00)
NONHDL: 178.87
TRIGLYCERIDES: 238 mg/dL — AB (ref 0.0–149.0)
VLDL: 47.6 mg/dL — ABNORMAL HIGH (ref 0.0–40.0)

## 2018-06-19 LAB — LDL CHOLESTEROL, DIRECT: Direct LDL: 142 mg/dL

## 2018-06-19 NOTE — Progress Notes (Signed)
Pre visit review using our clinic review tool, if applicable. No additional management support is needed unless otherwise documented below in the visit note. 

## 2018-06-19 NOTE — Progress Notes (Signed)
Chief Complaint  Patient presents with  . Annual Exam     Well Woman Emily Parrish is here for a complete physical.   Her last physical was >1 year ago.  Current diet: in general, a "healthy" diet. Current exercise: Will walk sometimes. Weight is stable and she denies daytime fatigue. Patient's last menstrual period was 07/10/1996.[name]  Seatbelt? Yes  Health Maintenance Colonoscopy- Yes Shingrix- No Lung cancer screening- Yes DEXA- Yes Mammogram- Yes Tetanus- Yes Pneumonia- Yes Hep C screen- Yes  Past Medical History:  Diagnosis Date  . Anxiety   . Esophageal reflux   . Fatty liver     NASH--s/p liver Bx 2011, early portal fibrosis   . Goiter   . Hiatal hernia   . OSA (obstructive sleep apnea) 12/07/2010   Apnealink 12/01/10>>AHI 9, RDI 10, SpO2 low 82%.   [name] Urethral stricture    uretheral stricture , s/p dilatation per urology ~ 2005  . Vitamin B 12 deficiency    mild   . Vitamin D deficiency      Past Surgical History:  Procedure Laterality Date  . APPENDECTOMY    . BIOPSY THYROID  2016   Consistent w/ benign follicular nodule  . ESOPHAGOGASTRODUODENOSCOPY  2005   [facility]    Medications  Current Outpatient Medications on File Prior to Visit  Medication Sig Dispense Refill  . citalopram (CELEXA) 20 MG tablet Take 1 tablet (20 mg total) by mouth daily. 90 tablet 3  . MILK THISTLE PO Take 1,300 mg by mouth daily.     . Multiple Vitamins-Minerals (MULTIVITAMIN PO) Take by mouth daily.     . RABEprazole (ACIPHEX) 20 MG tablet TAKE 1 TABLET DAILY 90 tablet 0   Allergies Allergies  Allergen Reactions  . Fluticasone-Salmeterol     REACTION: rapid heart rate, medication is inhalent    Review of Systems: Constitutional:  no sweats Eye:  no recent significant change in vision Ear/Nose/Mouth/Throat:  Ears:  No changes in hearing Nose/Mouth/Throat:  no complaints of nasal congestion, no sore throat Cardiovascular: no chest pain Respiratory:  No cough and no  shortness of breath Gastrointestinal:  no abdominal pain, no change in bowel habits GU:  Female: negative for dysuria or pelvic pain Musculoskeletal/Extremities:  no pain of the joints Integumentary (Skin/Breast):  no abnormal skin lesions reported Neurologic:  no headaches Psychiatric:  no anxiety, no depression Endocrine:  denies unexplained weight changes Hematologic/Lymphatic:  no abnormal bleeding  Exam BP 120/68 (BP Location: Left Arm, Patient Position: Sitting, Cuff Size: Large)   Pulse 71   Temp 97.9 F (36.6 C) (Oral)   Ht 5' 11.5" (1.816 m)   Wt 194 lb (88 kg)   LMP 07/10/1996   SpO2 96%   BMI 26.68 kg/m  General:  well developed, well nourished, in no apparent distress Skin:  no significant moles, warts, or growths Head:  no masses, lesions, or tenderness Eyes:  pupils equal and round, sclera anicteric without injection Ears:  canals without lesions, TMs shiny without retraction, no obvious effusion, no erythema Nose:  nares patent, septum midline, mucosa normal, and no drainage or sinus tenderness Throat/Pharynx:  lips and gingiva without lesion; tongue and uvula midline; non-inflamed pharynx; no exudates or postnasal drainage Neck: neck supple without adenopathy, thyromegaly, or masses Lungs:  clear to auscultation, breath sounds equal bilaterally, no respiratory distress Cardio:  regular rate and rhythm, no bruits or LE edema Abdomen:  abdomen soft, nontender; bowel sounds normal; no masses or organomegaly Genital: Deferred  Musculoskeletal:  symmetrical muscle groups noted without atrophy or deformity Extremities:  no clubbing, cyanosis, or edema, no deformities, no skin discoloration Neuro:  gait normal; deep tendon reflexes normal and symmetric Psych: well oriented with normal range of affect and appropriate judgment/insight  Assessment and Plan  Well adult exam - Plan: Lipid panel, Comprehensive metabolic panel   Well 72 y.o. female. Counseled on diet and  exercise. Other orders as above. Follow up in 6 mo pending the above workup. The patient voiced understanding and agreement to the plan.  [place], DO 06/19/18 7:29 AM

## 2018-06-19 NOTE — Patient Instructions (Addendum)
Give [place] 2-3 business days to get the results of your labs back.   Keep the diet clean and stay active.  Go back on your CPAP and see how you feel.  The new Shingrix vaccine (for shingles) is a 2 shot series. It can make people feel low energy, achy and almost like they have the flu for 48 hours after injection. Please plan accordingly when deciding on when to get this shot. Call our office for a nurse visit appointment to get this. The second shot of the series is less severe regarding the side effects, but it still lasts 48 hours.   Let us know if you need anything.

## 2018-06-21 DIAGNOSIS — M25512 Pain in left shoulder: Secondary | ICD-10-CM | POA: Diagnosis not present

## 2018-06-21 DIAGNOSIS — M6281 Muscle weakness (generalized): Secondary | ICD-10-CM | POA: Diagnosis not present

## 2018-06-26 ENCOUNTER — Ambulatory Visit: Payer: Medicare HMO | Admitting: Family Medicine

## 2018-06-26 ENCOUNTER — Telehealth: Payer: Self-pay

## 2018-06-26 NOTE — Telephone Encounter (Signed)
Clearance is for local anesthesia. OK to have done. Form filled out and placed on RE's desk. TY.

## 2018-06-26 NOTE — Telephone Encounter (Signed)
Copied from [place] [id number]. Topic: Quick Communication - Appointment Cancellation >> Jun 26, 2018 10:09 AM Yvette Rack wrote: Patient called to cancel appointment scheduled for 06-26-18. Patient has not rescheduled their appointment.  Route to department's PEC pool.

## 2018-06-26 NOTE — Telephone Encounter (Signed)
Faxed clearance.

## 2018-06-26 NOTE — Telephone Encounter (Signed)
Copied from [place] [phone]. Topic: General - Other >> Jun 26, 2018 10:11 AM Yvette Rack wrote: Reason for CRM: pt calling stating that [facility] a place where pt is having a liposuction stating that they need papers faxed back to them for surgery next week (F) [phone] Attn:Falal Fields  or  [employer]

## 2018-06-26 NOTE — Telephone Encounter (Signed)
Pt needs surgical clearance appt. Forms were made out to Dr.Wendling for completion? I placed them in Dr. Serita Sheller folder Monday.

## 2018-07-08 ENCOUNTER — Other Ambulatory Visit: Payer: Self-pay | Admitting: Internal Medicine

## 2018-07-29 ENCOUNTER — Other Ambulatory Visit: Payer: Self-pay | Admitting: Internal Medicine

## 2018-07-29 ENCOUNTER — Telehealth: Payer: Self-pay | Admitting: *Deleted

## 2018-07-29 DIAGNOSIS — E785 Hyperlipidemia, unspecified: Secondary | ICD-10-CM

## 2018-07-29 DIAGNOSIS — R739 Hyperglycemia, unspecified: Secondary | ICD-10-CM

## 2018-07-29 NOTE — Telephone Encounter (Signed)
Pt is scheduled for lab appt tomorrow at 7am for a1c, lipid panel but I do not see future orders placed.  Please place appropriate orders or advise if lab appt is not needed.

## 2018-07-29 NOTE — Telephone Encounter (Signed)
I have put in the order.

## 2018-07-29 NOTE — Telephone Encounter (Signed)
I believe Pt switched to Dr. [name].

## 2018-07-30 ENCOUNTER — Other Ambulatory Visit (INDEPENDENT_AMBULATORY_CARE_PROVIDER_SITE_OTHER): Payer: Medicare HMO

## 2018-07-30 ENCOUNTER — Encounter: Payer: Self-pay | Admitting: Internal Medicine

## 2018-07-30 ENCOUNTER — Ambulatory Visit (INDEPENDENT_AMBULATORY_CARE_PROVIDER_SITE_OTHER): Payer: Medicare HMO | Admitting: Internal Medicine

## 2018-07-30 VITALS — BP 118/78 | HR 73 | Temp 97.8°F | Resp 16 | Ht 71.5 in | Wt 192.2 lb

## 2018-07-30 DIAGNOSIS — R040 Epistaxis: Secondary | ICD-10-CM | POA: Diagnosis not present

## 2018-07-30 DIAGNOSIS — R739 Hyperglycemia, unspecified: Secondary | ICD-10-CM | POA: Diagnosis not present

## 2018-07-30 DIAGNOSIS — E785 Hyperlipidemia, unspecified: Secondary | ICD-10-CM

## 2018-07-30 LAB — HEMOGLOBIN A1C: Hgb A1c MFr Bld: 5.4 % (ref 4.6–6.5)

## 2018-07-30 LAB — LIPID PANEL
Cholesterol: 238 mg/dL — ABNORMAL HIGH (ref 0–200)
HDL: 47.2 mg/dL (ref >39.00)
NonHDL: 190.94
Total CHOL/HDL Ratio: 5
Triglycerides: 216 mg/dL — ABNORMAL HIGH (ref 0.0–149.0)
VLDL: 43.2 mg/dL — ABNORMAL HIGH (ref 0.0–40.0)

## 2018-07-30 LAB — LDL CHOLESTEROL, DIRECT: Direct LDL: 154 mg/dL

## 2018-07-30 NOTE — Patient Instructions (Signed)
Use a humidifier  Use some Vaseline on your nostrils at nighttime  Call if you have more nosebleeds, will refer you to the ear nose and throat doctor

## 2018-07-30 NOTE — Progress Notes (Signed)
Pre visit review using our clinic review tool, if applicable. No additional management support is needed unless otherwise documented below in the visit note. 

## 2018-07-30 NOTE — Progress Notes (Signed)
Subjective:    Patient ID: Emily Parrish, female    DOB: [birth year], 73 y.o.   MRN: [id number]  DOS:  07/30/2018 Type of visit - description: Acute visit 4 days ago, she sneezed and immediately saw red blood coming out of her right nostril. She packed the area and put some ice on her neck and the bleeding is stopped. She had 2 additional bleedings that day, also in the context of sneezing, the last one take almost an hour to stop. This morning, had another episode.    Review of Systems No fever chills No runny nose or sore throat No sinus pain or congestion Past Medical History:  Diagnosis Date  . Anxiety   . Esophageal reflux   . Fatty liver     NASH--s/p liver Bx 2011, early portal fibrosis   . Goiter   . Hiatal hernia   . OSA (obstructive sleep apnea) 12/07/2010   Apnealink 12/01/10>>AHI 9, RDI 10, SpO2 low 82%.   [name] Urethral stricture    uretheral stricture , s/p dilatation per urology ~ 2005  . Vitamin B 12 deficiency    mild   . Vitamin D deficiency     Past Surgical History:  Procedure Laterality Date  . APPENDECTOMY    . BIOPSY THYROID  2016   Consistent w/ benign follicular nodule  . ESOPHAGOGASTRODUODENOSCOPY  2005   [place]    Social History   Socioeconomic History  . Marital status: Divorced    Spouse name: Not on file  . Number of children: 0  . Years of education: college  . Highest education level: Not on file  Occupational History  . 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 Financial resource strain: Not on file  . Food insecurity:    Worry: Not on file    Inability: Not on file  . Transportation needs:    Medical: Not on file    Non-medical: Not on file  Tobacco Use  . Smoking status: Former Smoker    Years: 10.00    Types: Cigarettes    Last attempt to quit: 07/11/1971    Years since quitting: 47.0  . Smokeless tobacco: Never Used  . Tobacco comment: used to smoke--1 1/2 pack per week   Substance and Sexual Activity  . Alcohol use: Yes    Alcohol/week: 5.0 - 6.0 standard drinks    Types: 5 - 6 Glasses of wine per week    Comment: social  . Drug use: No  . Sexual activity: Not Currently    Partners: Male    Birth control/protection: Post-menopausal  Lifestyle  . Physical activity:    Days per week: Not on file    Minutes per session: Not on file  . Stress: Not on file  Relationships  . Social connections:    Talks on phone: Not on file    Gets together: Not on file    Attends religious service: Not on file    Active member of club or organization: Not on file    Attends meetings of clubs or organizations: Not on file    Relationship status: Not on file  . Intimate partner violence:    Fear of current or ex partner: Not on file    Emotionally abused: Not on file    Physically abused: Not on file    Forced sexual activity: Not on file  Other Topics Concern  . Not on file  Social  History Narrative   Lives by herself , no family in town   emergency contact Graceann Congress [phone])   Drinks about 1-2 caffeine drinks a day       Allergies as of 07/30/2018      Reactions   Fluticasone-salmeterol    REACTION: rapid heart rate, medication is inhalent      Medication List       Accurate as of July 30, 2018  3:47 PM. Always use your most recent med list.        citalopram 20 MG tablet Commonly known as:  CELEXA Take 1 tablet (20 mg total) by mouth daily.   MILK THISTLE PO Take 1,300 mg by mouth daily.   MULTIVITAMIN PO Take by mouth daily.   RABEprazole 20 MG tablet Commonly known as:  ACIPHEX TAKE 1 TABLET DAILY           Objective:   Physical Exam BP 118/78 (BP Location: Left Arm, Patient Position: Sitting, Cuff Size: Normal)   Pulse 73   Temp 97.8 F (36.6 C) (Oral)   Resp 16   Ht 5' 11.5" (1.816 m)   Wt 192 lb 4 oz (87.2 kg)   LMP 07/10/1996   SpO2 98%   BMI 26.44 kg/m  General:   Well developed, NAD, BMI noted. HEENT:    Normocephalic . Face symmetric, atraumatic.  TMs normal.  Throat symmetric. Nose: Inspection with the speculum is actually normal.  I do not see any evidence of recent bleeding on either side.  No polyps.  No lesions. Neck: No lymphadenopathies.  Neck symmetric. Skin: Not pale. Not jaundice Neurologic:  alert & oriented X3.  Speech normal, gait appropriate for age and unassisted Psych--  Cognition and judgment appear intact.  Cooperative with normal attention span and concentration.  Behavior appropriate. No anxious or depressed appearing.      Assessment     Assessment   Anxiety Mild dyslipidemia Goiter, had one (-) FNA before, last [place] 04-2014, 04-2015 FNA (-)  GI: --GERD, HH --Fatty liver---NASH--s/p liver Bx 2011, early portal fibrosis  OSA, on CPAP -Apnealink 12/01/10>>AHI 9, RDI 10, SpO2 low 82%.  -saw neuro for mngmt 06-2016 H/o urethral stricture-- s/p dilatation, urology B12 and Vit D deficiency  PLAN: Nosebleeds:  No history of previous problems, she had few  bleeds in the last 4 days, always from the right side.  Exam is normal. Related to sneezing?. Plan: Use a humidifier,  Vaseline at night, if she continue with symptoms, needs to call me for ENT eval. Preventive care: CPX per Dr. [name] few days ago. Aspirin: Self started aspirin few weeks ago to "help cholesterol", has not taking it regularly, advised patient  aspirin likely is playing a role of her symptoms (nosebleed)  Also, currently literature is not  recommending aspirin for primary prevention.

## 2018-07-31 ENCOUNTER — Telehealth: Payer: Self-pay

## 2018-07-31 DIAGNOSIS — R040 Epistaxis: Secondary | ICD-10-CM

## 2018-07-31 NOTE — Assessment & Plan Note (Signed)
Nosebleeds:  No history of previous problems, she had few  bleeds in the last 4 days, always from the right side.  Exam is normal. Related to sneezing?. Plan: Use a humidifier,  Vaseline at night, if she continue with symptoms, needs to call me for ENT eval. Preventive care: CPX per Dr. [name] few days ago. Aspirin: Self started aspirin few weeks ago to "help cholesterol", has not taking it regularly, advised patient  aspirin likely is playing a role of her symptoms (nosebleed)  Also, currently literature is not  recommending aspirin for primary prevention.

## 2018-07-31 NOTE — Telephone Encounter (Signed)
Referral placed.

## 2018-07-31 NOTE — Telephone Encounter (Signed)
Copied from [place]. Topic: Referral - Request for Referral >> Jul 31, 2018 11:43 AM Lennox Solders wrote: Has patient seen PCP for this complaint? Yes. Pt saw dr Larose Kells yesterday and per pt was told if she get another nose bleed dr Larose Kells would referral her to ENT. Pt had another nose bleed today. Pt has [employer]

## 2018-08-05 ENCOUNTER — Other Ambulatory Visit: Payer: Self-pay | Admitting: Internal Medicine

## 2018-08-13 ENCOUNTER — Encounter: Payer: Self-pay | Admitting: Certified Nurse Midwife

## 2018-08-13 DIAGNOSIS — Z1231 Encounter for screening mammogram for malignant neoplasm of breast: Secondary | ICD-10-CM | POA: Diagnosis not present

## 2018-08-13 DIAGNOSIS — Z780 Asymptomatic menopausal state: Secondary | ICD-10-CM | POA: Diagnosis not present

## 2018-08-13 DIAGNOSIS — Z803 Family history of malignant neoplasm of breast: Secondary | ICD-10-CM | POA: Diagnosis not present

## 2018-08-13 DIAGNOSIS — E349 Endocrine disorder, unspecified: Secondary | ICD-10-CM | POA: Diagnosis not present

## 2018-08-16 ENCOUNTER — Telehealth: Payer: Self-pay

## 2018-08-16 NOTE — Telephone Encounter (Signed)
Left message to notify patient of bone density results.  Right & left hip low normal range. Spine-low normal range. Needs 4 servings of calcium daily along with vitamin d and weight bearing exercise. F/u 59yr.

## 2018-08-19 DIAGNOSIS — R69 Illness, unspecified: Secondary | ICD-10-CM | POA: Diagnosis not present

## 2018-08-20 NOTE — Telephone Encounter (Signed)
Patient notified of results. See lab 

## 2018-10-06 ENCOUNTER — Other Ambulatory Visit: Payer: Self-pay | Admitting: Internal Medicine

## 2018-11-06 ENCOUNTER — Other Ambulatory Visit: Payer: Self-pay | Admitting: Internal Medicine

## 2019-03-20 ENCOUNTER — Other Ambulatory Visit: Payer: Self-pay | Admitting: Internal Medicine

## 2019-03-31 ENCOUNTER — Telehealth: Payer: Self-pay

## 2019-03-31 NOTE — Telephone Encounter (Signed)
Copied from [place] [id number]. Topic: Appointment Scheduling - Transfer of Care >> Mar 31, 2019  9:32 AM [name] wrote: Pt is requesting to transfer FROM: Emily Parrish Pt is requesting to transfer TO: [name] Reason for requested transfer: not given  Send CRM to patient's current PCP (transferring FROM).

## 2019-03-31 NOTE — Telephone Encounter (Signed)
Okay to schedule transfer of care appt w/ Dr. [name].

## 2019-03-31 NOTE — Telephone Encounter (Signed)
[place] w/ me , thx

## 2019-03-31 NOTE — Telephone Encounter (Signed)
OK w me.  

## 2019-04-02 ENCOUNTER — Other Ambulatory Visit: Payer: Self-pay

## 2019-04-02 ENCOUNTER — Other Ambulatory Visit (INDEPENDENT_AMBULATORY_CARE_PROVIDER_SITE_OTHER): Payer: Medicare HMO

## 2019-04-02 ENCOUNTER — Encounter: Payer: Self-pay | Admitting: Internal Medicine

## 2019-04-02 ENCOUNTER — Ambulatory Visit: Payer: Medicare HMO | Admitting: Internal Medicine

## 2019-04-02 VITALS — BP 98/58 | HR 84 | Temp 98.0°F | Ht 71.5 in | Wt 189.0 lb

## 2019-04-02 DIAGNOSIS — K219 Gastro-esophageal reflux disease without esophagitis: Secondary | ICD-10-CM | POA: Diagnosis not present

## 2019-04-02 DIAGNOSIS — K760 Fatty (change of) liver, not elsewhere classified: Secondary | ICD-10-CM

## 2019-04-02 DIAGNOSIS — K7581 Nonalcoholic steatohepatitis (NASH): Secondary | ICD-10-CM

## 2019-04-02 LAB — CBC WITH DIFFERENTIAL/PLATELET
Basophils Absolute: 0 10*3/uL (ref 0.0–0.1)
Basophils Relative: 1 % (ref 0.0–3.0)
Eosinophils Absolute: 0.1 10*3/uL (ref 0.0–0.7)
Eosinophils Relative: 1.6 % (ref 0.0–5.0)
HCT: 39.9 % (ref 36.0–46.0)
Hemoglobin: 13.6 g/dL (ref 12.0–15.0)
Lymphocytes Relative: 45.9 % (ref 12.0–46.0)
Lymphs Abs: 1.8 10*3/uL (ref 0.7–4.0)
MCHC: 34 g/dL (ref 30.0–36.0)
MCV: 94.9 fl (ref 78.0–100.0)
Monocytes Absolute: 0.2 10*3/uL (ref 0.1–1.0)
Monocytes Relative: 6.5 % (ref 3.0–12.0)
Neutro Abs: 1.7 10*3/uL (ref 1.4–7.7)
Neutrophils Relative %: 45 % (ref 43.0–77.0)
Platelets: 137 10*3/uL — ABNORMAL LOW (ref 150.0–400.0)
RBC: 4.21 Mil/uL (ref 3.87–5.11)
RDW: 13.1 % (ref 11.5–15.5)
WBC: 3.8 10*3/uL — ABNORMAL LOW (ref 4.0–10.5)

## 2019-04-02 LAB — COMPREHENSIVE METABOLIC PANEL
ALT: 55 U/L — ABNORMAL HIGH (ref 0–35)
AST: 44 U/L — ABNORMAL HIGH (ref 0–37)
Albumin: 4.5 g/dL (ref 3.5–5.2)
Alkaline Phosphatase: 40 U/L (ref 39–117)
BUN: 22 mg/dL (ref 6–23)
CO2: 27 mEq/L (ref 19–32)
Calcium: 10.3 mg/dL (ref 8.4–10.5)
Chloride: 101 mEq/L (ref 96–112)
Creatinine, Ser: 1.14 mg/dL (ref 0.40–1.20)
GFR: 46.5 mL/min — ABNORMAL LOW (ref >60.00)
Glucose, Bld: 99 mg/dL (ref 70–99)
Potassium: 4.2 mEq/L (ref 3.5–5.1)
Sodium: 138 mEq/L (ref 135–145)
Total Bilirubin: 0.6 mg/dL (ref 0.2–1.2)
Total Protein: 7.9 g/dL (ref 6.0–8.3)

## 2019-04-02 LAB — PROTIME-INR
INR: 1.2 ratio — ABNORMAL HIGH (ref 0.8–1.0)
Prothrombin Time: 13.7 s — ABNORMAL HIGH (ref 9.6–13.1)

## 2019-04-02 MED ORDER — RABEPRAZOLE SODIUM 20 MG PO TBEC: DELAYED_RELEASE_TABLET | ORAL | 3 refills | Status: DC

## 2019-04-02 NOTE — Progress Notes (Signed)
HISTORY OF PRESENT ILLNESS:  Emily Parrish is a 73 y.o. female with a history of [name] with fibrosis on previous liver biopsy (previous patient of Dr. Verl Blalock) with chronic elevation of liver test.  Last seen in this office May 24, 2017 regarding ongoing management and monitoring of her liver disease and GERD.  Liver ultrasound at that time was negative for mass.  She was advised with regards to exercise and weight loss.  Continued on PPI for control of reflux symptoms.  Told to follow-up in 1 year but follows up at this time.  Patient is pleased to report that she has had no interval medical issues.  She does require AcipHex to control reflux symptoms.  No dysphasia.  Laboratories from January 2020 include lipid panel.  Laboratories from December 2019 with AST 40 and ALT 53.  Last ultrasound performed November 2018 with hepatic cyst but otherwise negative.  She did undergo screening colonoscopy in 2015 which was negative for neoplasia.  She denies problems with GI bleeding or mentation.  She does noticed occasional belching.  She is pleased to report 25 pound weight loss since her last visit.  REVIEW OF SYSTEMS:  All non-GI ROS negative unless otherwise stated in the HPI except for anxiety  Past Medical History:  Diagnosis Date  . Anxiety   . Esophageal reflux   . Fatty liver     NASH--s/p liver Bx 2011, early portal fibrosis   . Goiter   . Hiatal hernia   . OSA (obstructive sleep apnea) 12/07/2010   Apnealink 12/01/10>>AHI 9, RDI 10, SpO2 low 82%.   [name] Urethral stricture    uretheral stricture , s/p dilatation per urology ~ 2005  . Vitamin B 12 deficiency    mild   . Vitamin D deficiency     Past Surgical History:  Procedure Laterality Date  . APPENDECTOMY    . BIOPSY THYROID  2016   Consistent w/ benign follicular nodule  . ESOPHAGOGASTRODUODENOSCOPY  2005   [place]    Social History Emily Parrish  reports that she quit smoking about 47 years ago. Her smoking use  included cigarettes. She quit after 10.00 years of use. She has never used smokeless tobacco. She reports current alcohol use of about 5.0 - 6.0 standard drinks of alcohol per week. She reports that she does not use drugs.  family history includes Alcohol abuse in her father; Breast cancer in her maternal aunt; Coronary artery disease in her maternal grandfather; Emphysema in her father; Scleroderma in her mother; Stroke in her brother.  Allergies  Allergen Reactions  . Fluticasone-Salmeterol     REACTION: rapid heart rate, medication is inhalent       PHYSICAL EXAMINATION: Vital signs: BP (!) 98/58   Pulse 84   Temp 98 F (36.7 C)   Ht 5' 11.5" (1.816 m)   Wt 189 lb (85.7 kg)   LMP 07/10/1996   BMI 25.99 kg/m   Constitutional: generally well-appearing, no acute distress Psychiatric: alert and oriented x3, cooperative Eyes: extraocular movements intact, anicteric, conjunctiva pink Mouth: oral pharynx moist, no lesions Neck: supple no lymphadenopathy Cardiovascular: heart regular rate and rhythm, no murmur Lungs: clear to auscultation bilaterally Abdomen: soft, nontender, nondistended, no obvious ascites, no peritoneal signs, normal bowel sounds, no organomegaly Rectal: Omitted Extremities: no clubbing or cyanosis.  Trace lower extremity edema bilaterally Skin: no lesions on visible extremities Neuro: No focal deficits. No asterixis.    ASSESSMENT:  1.  NASH with fibrosis on  previous liver biopsy.  No evidence for overt cirrhosis.  Last liver evaluation 2 years ago. 2.  Chronically elevated hepatic transaminases secondary to NASH 3.  GERD.  Symptoms controlled on AcipHex 4.  Obesity.  Significant weight loss since last visit.  BMI now 25.99   PLAN:  1.  Continue with exercise and weight loss 2.  Laboratories today including CBC, comprehensive metabolic panel, INR, AFP 3.  Liver ultrasound to screen for [place] 4.  Refill AcipHex for 1 year. 5.  Reflux precautions 6.   Routine GI follow-up 1 year.  Contact the office in the interim for any questions or problems

## 2019-04-02 NOTE — Telephone Encounter (Signed)
LM for pt to call and schedule TOC to Dr. [name]

## 2019-04-02 NOTE — Patient Instructions (Signed)
Your provider has requested that you go to the basement level for lab work before leaving today. Press "B" on the elevator. The lab is located at the first door on the left as you exit the elevator.  We have sent the following medications to your pharmacy for you to pick up at your convenience:  Aciphex  You have been scheduled for an abdominal ultrasound at [facility] (1st floor of hospital) on 04/15/2019 at 9:00am. Please arrive 15 minutes prior to your appointment for registration. Make certain not to have anything to eat or drink after midnight prior to your appointment. Should you need to reschedule your appointment, please contact radiology at [phone]. This test typically takes about 30 minutes to perform.

## 2019-04-03 LAB — AFP TUMOR MARKER: AFP-Tumor Marker: 4 ng/mL

## 2019-04-09 ENCOUNTER — Ambulatory Visit: Payer: Medicare HMO | Admitting: Certified Nurse Midwife

## 2019-04-15 ENCOUNTER — Other Ambulatory Visit: Payer: Self-pay

## 2019-04-15 ENCOUNTER — Ambulatory Visit (HOSPITAL_COMMUNITY)
Admission: RE | Admit: 2019-04-15 | Discharge: 2019-04-15 | Disposition: A | Discharge status: Home / Self Care | Payer: Medicare HMO | Source: Ambulatory Visit | Attending: Internal Medicine | Admitting: Internal Medicine

## 2019-04-15 DIAGNOSIS — K7581 Nonalcoholic steatohepatitis (NASH): Secondary | ICD-10-CM | POA: Insufficient documentation

## 2019-04-15 DIAGNOSIS — K760 Fatty (change of) liver, not elsewhere classified: Secondary | ICD-10-CM | POA: Insufficient documentation

## 2019-04-15 DIAGNOSIS — K7689 Other specified diseases of liver: Secondary | ICD-10-CM | POA: Diagnosis not present

## 2019-04-15 DIAGNOSIS — K219 Gastro-esophageal reflux disease without esophagitis: Secondary | ICD-10-CM | POA: Diagnosis not present

## 2019-04-15 NOTE — Progress Notes (Signed)
73 y.o. G0P0000 Single  Caucasian Fe here for annual exam. Post menopausal denies vaginal dryness or vaginal bleeding.Sees PCP later in year for labs and Celexa and Aciphex management. Staying busy since retirement. Traveled come this year with mask use. No health issues today.  Patient's last menstrual period was 07/10/1996.          Sexually active: No.  The current method of family planning is post menopausal status.    Exercising: No.  exercise Smoker:  no  Review of Systems  Constitutional: Negative.   HENT: Negative.   Eyes: Negative.   Respiratory: Negative.   Cardiovascular: Negative.   Gastrointestinal: Negative.   Genitourinary: Negative.   Musculoskeletal: Negative.   Skin: Negative.   Neurological: Negative.   Endo/Heme/Allergies: Negative.   Psychiatric/Behavioral: Negative.     Health Maintenance: Pap:  01-27-16 neg, 04-04-18 neg HPV HR neg History of Abnormal Pap: no MMG:  08-13-2018 category b density birads 1:neg Self Breast exams: no Colonoscopy:  2015 f/u 63yr BMD:   2020 TDaP:  2018 Shingles: 26010,9323Pneumonia: 2017 Hep C and HIV: both neg 2017 Labs: PCP   reports that she quit smoking about 47 years ago. Her smoking use included cigarettes. She quit after 10.00 years of use. She has never used smokeless tobacco. She reports current alcohol use of about 5.0 - 6.0 standard drinks of alcohol per week. She reports that she does not use drugs.  Past Medical History:  Diagnosis Date  . Anxiety   . Esophageal reflux   . Fatty liver     NASH--s/p liver Bx 2011, early portal fibrosis   . Goiter   . Hiatal hernia   . OSA (obstructive sleep apnea) 12/07/2010   Apnealink 12/01/10>>AHI 9, RDI 10, SpO2 low 82%.   .Marland KitchenUrethral stricture    uretheral stricture , s/p dilatation per urology ~ 2005  . Vitamin B 12 deficiency    mild   . Vitamin D deficiency     Past Surgical History:  Procedure Laterality Date  . APPENDECTOMY    . BIOPSY THYROID  2016   Consistent w/ benign follicular nodule  . [facility]  [facility]   Current Outpatient Medications  Medication Sig Dispense Refill  . citalopram (CELEXA) 20 MG tablet Take 1 tablet (20 mg total) by mouth daily. 90 tablet 1  . MILK THISTLE PO Take 1,300 mg by mouth daily.     . Multiple Vitamins-Minerals (MULTIVITAMIN PO) Take by mouth daily.     . RABEprazole (ACIPHEX) 20 MG tablet Take one tablet by mouth daily 90 tablet 3   No current facility-administered medications for this visit.     Family History  Problem Relation Age of Onset  . Coronary artery disease Maternal Grandfather   . Breast cancer Maternal Aunt        died at age 75 . Scleroderma Mother   . Emphysema Father   . Alcohol abuse Father   . Stroke Brother   . Colon cancer Neg Hx   . Diabetes Neg Hx     ROS:  Pertinent items are noted in HPI.  Otherwise, a comprehensive ROS was negative.  Exam:   BP 110/60   Pulse 68   Temp (!) 97.1 F (36.2 C) (Skin)   Resp 16   Ht 5' 11.25" (1.81 m)   Wt 189 lb (85.7 kg)   LMP 07/10/1996   BMI 26.18 kg/m  Height: 5' 11.25" (181 cm) Ht Readings from  Last 3 Encounters:  04/16/19 5' 11.25" (1.81 m)  04/02/19 5' 11.5" (1.816 m)  07/30/18 5' 11.5" (1.816 m)    General appearance: alert, cooperative and appears stated age Head: Normocephalic, without obvious abnormality, atraumatic Neck: no adenopathy, supple, symmetrical, trachea midline and thyroid normal to inspection and palpation Lungs: clear to auscultation bilaterally Breasts: normal appearance, no masses or tenderness, No nipple retraction or dimpling, No nipple discharge or bleeding, No axillary or supraclavicular adenopathy Heart: regular rate and rhythm Abdomen: soft, non-tender; no masses,  no organomegaly Extremities: extremities normal, atraumatic, no cyanosis or edema Skin: Skin color, texture, turgor normal. No rashes or lesions Lymph nodes: Cervical, supraclavicular, and axillary nodes  normal. No abnormal inguinal nodes palpated Neurologic: Grossly normal   Pelvic: External genitalia:  no lesions              Urethra:  normal appearing urethra with no masses, tenderness or lesions              Bartholin's and Skene's: normal                 Vagina: normal appearing vagina with normal color and discharge, no lesions              Cervix: no cervical motion tenderness, no lesions and normal appearance              Pap taken: No. Bimanual Exam:  Uterus:  normal size, contour, position, consistency, mobility, non-tender and anteverted              Adnexa: normal adnexa and no mass, fullness, tenderness               Rectovaginal: Confirms               Anus:  normal sphincter tone, no lesions  Chaperone present: yes  A:  Well Woman with normal exam  Post menopausal no HRT  Low bone mass on bone density, working on weight bearing exercise and calcium intake  Celexa/Aciphex,labs management with PCP  Immunization due Flu high dose  P:   Reviewed health and wellness pertinent to exam  Aware of need to advise if vaginal bleeding.  Discussed coconut oil for vaginal dryness.  BMD due in 2 years.  Continue follow up with PCP as indicated.  Flu vaccine given  Pap smear: no   counseled on breast self exam, mammography screening, HIV risk factors and prevention, feminine hygiene, adequate intake of calcium and vitamin D, diet and exercise  return annually or prn  An After Visit Summary was printed and given to the patient.

## 2019-04-16 ENCOUNTER — Other Ambulatory Visit: Payer: Self-pay

## 2019-04-16 ENCOUNTER — Encounter: Payer: Self-pay | Admitting: Certified Nurse Midwife

## 2019-04-16 ENCOUNTER — Ambulatory Visit (INDEPENDENT_AMBULATORY_CARE_PROVIDER_SITE_OTHER): Payer: Medicare HMO | Admitting: Certified Nurse Midwife

## 2019-04-16 VITALS — BP 110/60 | HR 68 | Temp 97.1°F | Resp 16 | Ht 71.25 in | Wt 189.0 lb

## 2019-04-16 DIAGNOSIS — Z23 Encounter for immunization: Secondary | ICD-10-CM

## 2019-04-16 DIAGNOSIS — Z01419 Encounter for gynecological examination (general) (routine) without abnormal findings: Secondary | ICD-10-CM

## 2019-04-16 NOTE — Patient Instructions (Signed)
EXERCISE AND DIET:  We recommended that you start or continue a regular exercise program for good health. Regular exercise means any activity that makes your heart beat faster and makes you sweat.  We recommend exercising at least 30 minutes per day at least 3 days a week, preferably 4 or 5.  We also recommend a diet low in fat and sugar.  Inactivity, poor dietary choices and obesity can cause diabetes, heart attack, stroke, and kidney damage, among others.    ALCOHOL AND SMOKING:  Women should limit their alcohol intake to no more than 7 drinks/beers/glasses of wine (combined, not each!) per week. Moderation of alcohol intake to this level decreases your risk of breast cancer and liver damage. And of course, no recreational drugs are part of a healthy lifestyle.  And absolutely no smoking or even second hand smoke. Most people know smoking can cause heart and lung diseases, but did you know it also contributes to weakening of your bones? Aging of your skin?  Yellowing of your teeth and nails?  CALCIUM AND VITAMIN D:  Adequate intake of calcium and Vitamin D are recommended.  The recommendations for exact amounts of these supplements seem to change often, but generally speaking 600 mg of calcium (either carbonate or citrate) and 800 units of Vitamin D per day seems prudent. Certain women may benefit from higher intake of Vitamin D.  If you are among these women, your doctor will have told you during your visit.    PAP SMEARS:  Pap smears, to check for cervical cancer or precancers,  have traditionally been done yearly, although recent scientific advances have shown that most women can have pap smears less often.  However, every woman still should have a physical exam from her gynecologist every year. It will include a breast check, inspection of the vulva and vagina to check for abnormal growths or skin changes, a visual exam of the cervix, and then an exam to evaluate the size and shape of the uterus and  ovaries.  And after 75 years of age, a rectal exam is indicated to check for rectal cancers. We will also provide age appropriate advice regarding health maintenance, like when you should have certain vaccines, screening for sexually transmitted diseases, bone density testing, colonoscopy, mammograms, etc.   MAMMOGRAMS:  All women over 29 years old should have a yearly mammogram. Many facilities now offer a "3D" mammogram, which may cost around $50 extra out of pocket. If possible,  we recommend you accept the option to have the 3D mammogram performed.  It both reduces the number of women who will be called back for extra views which then turn out to be normal, and it is better than the routine mammogram at detecting truly abnormal areas.    COLONOSCOPY:  Colonoscopy to screen for colon cancer is recommended for all women at age 28.  We know, you hate the idea of the prep.  We agree, BUT, having colon cancer and not knowing it is worse!!  Colon cancer so often starts as a polyp that can be seen and removed at colonscopy, which can quite literally save your life!  And if your first colonoscopy is normal and you have no family history of colon cancer, most women don't have to have it again for 10 years.  Once every ten years, you can do something that may end up saving your life, right?  We will be happy to help you get it scheduled when you are ready.  Be sure to check your insurance coverage so you understand how much it will cost.  It may be covered as a preventative service at no cost, but you should check your particular policy.      Atrophic Vaginitis Atrophic vaginitis is a condition in which the tissues that line the vagina become dry and thin. This condition occurs in women who have stopped having their period. It is caused by a drop in a female hormone (estrogen). This hormone helps:  To keep the vagina moist.  To make a clear fluid. This clear fluid helps: ? To make the vagina ready for  sex. ? To protect the vagina from infection. If the lining of the vagina is dry and thin, it may cause irritation, burning, or itchiness. It may also:  Make sex painful.  Make an exam of your vagina painful.  Cause bleeding.  Make you lose interest in sex.  Cause a burning feeling when you pee (urinate).  Cause a brown or yellow fluid to come from your vagina. Some women do not have symptoms. Follow these instructions at home: Medicines  Take over-the-counter and prescription medicines only as told by your doctor.  Do not use herbs or other medicines unless your doctor says it is okay.  Use medicines for for dryness. These include: ? Oils to make the vagina soft. ? Creams. ? Moisturizers. General instructions  Do not douche.  Do not use products that can make your vagina dry. These include: ? Scented sprays. ? Scented tampons. ? Scented soaps.  Sex can help increase blood flow and soften the tissue in the vagina. If it hurts to have sex: ? Tell your partner. ? Use products to make sex more comfortable. Use these only as told by your doctor. Contact a doctor if you:  Have discharge from the vagina that is different than usual.  Have a bad smell coming from your vagina.  Have new symptoms.  Do not get better.  Get worse. Summary  Atrophic vaginitis is a condition in which the lining of the vagina becomes dry and thin.  This condition affects women who have stopped having their periods.  Treatment may include using products that help make the vagina soft.  Call a doctor if do not get better with treatment. This information is not intended to replace advice given to you by your health care provider. Make sure you discuss any questions you have with your health care provider. Document Released: 12/13/2007 Document Revised: 07/09/2017 Document Reviewed: 07/09/2017 Elsevier Patient Education  2020 [employer].

## 2019-04-21 ENCOUNTER — Encounter: Payer: Self-pay | Admitting: Family Medicine

## 2019-04-21 ENCOUNTER — Other Ambulatory Visit: Payer: Self-pay

## 2019-04-21 ENCOUNTER — Ambulatory Visit (INDEPENDENT_AMBULATORY_CARE_PROVIDER_SITE_OTHER): Payer: Medicare HMO | Admitting: Family Medicine

## 2019-04-21 DIAGNOSIS — E559 Vitamin D deficiency, unspecified: Secondary | ICD-10-CM | POA: Diagnosis not present

## 2019-04-21 DIAGNOSIS — F411 Generalized anxiety disorder: Secondary | ICD-10-CM | POA: Diagnosis not present

## 2019-04-21 DIAGNOSIS — R69 Illness, unspecified: Secondary | ICD-10-CM | POA: Diagnosis not present

## 2019-04-21 NOTE — Progress Notes (Signed)
Chief Complaint  Patient presents with  . New Patient (Initial Visit)    transfer of care    Subjective Emily Parrish presents for f/u anxiety. Due to COVID-19 pandemic, we are interacting via web portal for an electronic face-to-face visit. I verified patient's ID using 2 identifiers. Patient agreed to proceed with visit via this method. Patient is at home, I am at office. Patient and I are present for visit.   She is currently being treated with Celexa 20 mg/d.  Tried going off in past, did not go well. Would prefer to stay on.  Doing very well on this. No AE's. No thoughts of harming self or others. No self-medication with alcohol, prescription drugs or illicit drugs. Pt is not following with a counselor/psychologist.  Hx of Vit D def, not currently on supp. Takes MV w Vit D. No other supp.   ROS Psych: No homicidal or suicidal thoughts  Past Medical History:  Diagnosis Date  . Anxiety   . Esophageal reflux   . Fatty liver     NASH--s/p liver Bx 2011, early portal fibrosis   . Goiter   . Hiatal hernia   . OSA (obstructive sleep apnea) 12/07/2010   Apnealink 12/01/10>>AHI 9, RDI 10, SpO2 low 82%.   [name] Urethral stricture    uretheral stricture , s/p dilatation per urology ~ 2005  . Vitamin B 12 deficiency    mild   . Vitamin D deficiency    Exam No conversational dyspnea Age appropriate judgment and insight Nml affect and mood  Assessment and Plan  GAD (generalized anxiety disorder)  Vitamin D deficiency  1- Cont Celexa 2- take daily 1000 IU supp, cont MV. Ck at CPE in 2 mo. F/u for CPE. The patient voiced understanding and agreement to the plan.  [place], DO 04/21/19 8:41 AM

## 2019-05-01 DIAGNOSIS — H2513 Age-related nuclear cataract, bilateral: Secondary | ICD-10-CM | POA: Diagnosis not present

## 2019-05-01 DIAGNOSIS — H04123 Dry eye syndrome of bilateral lacrimal glands: Secondary | ICD-10-CM | POA: Diagnosis not present

## 2019-05-01 DIAGNOSIS — H5201 Hypermetropia, right eye: Secondary | ICD-10-CM | POA: Diagnosis not present

## 2019-05-01 DIAGNOSIS — Z9889 Other specified postprocedural states: Secondary | ICD-10-CM | POA: Diagnosis not present

## 2019-06-23 ENCOUNTER — Ambulatory Visit: Payer: Medicare HMO | Admitting: Family Medicine

## 2019-06-24 ENCOUNTER — Other Ambulatory Visit: Payer: Self-pay

## 2019-06-25 ENCOUNTER — Other Ambulatory Visit: Payer: Self-pay | Admitting: Family Medicine

## 2019-06-25 ENCOUNTER — Encounter: Payer: Self-pay | Admitting: Family Medicine

## 2019-06-25 ENCOUNTER — Ambulatory Visit (INDEPENDENT_AMBULATORY_CARE_PROVIDER_SITE_OTHER): Payer: Medicare HMO | Admitting: Family Medicine

## 2019-06-25 ENCOUNTER — Other Ambulatory Visit: Payer: Self-pay

## 2019-06-25 VITALS — BP 122/82 | HR 96 | Temp 95.2°F | Ht 71.5 in | Wt 191.1 lb

## 2019-06-25 DIAGNOSIS — Z0000 Encounter for general adult medical examination without abnormal findings: Secondary | ICD-10-CM | POA: Diagnosis not present

## 2019-06-25 DIAGNOSIS — Z0001 Encounter for general adult medical examination with abnormal findings: Secondary | ICD-10-CM | POA: Diagnosis not present

## 2019-06-25 DIAGNOSIS — R0600 Dyspnea, unspecified: Secondary | ICD-10-CM

## 2019-06-25 DIAGNOSIS — R0609 Other forms of dyspnea: Secondary | ICD-10-CM

## 2019-06-25 DIAGNOSIS — E559 Vitamin D deficiency, unspecified: Secondary | ICD-10-CM

## 2019-06-25 DIAGNOSIS — R739 Hyperglycemia, unspecified: Secondary | ICD-10-CM

## 2019-06-25 DIAGNOSIS — E785 Hyperlipidemia, unspecified: Secondary | ICD-10-CM

## 2019-06-25 LAB — COMPREHENSIVE METABOLIC PANEL
ALT: 31 U/L (ref 0–35)
AST: 25 U/L (ref 0–37)
Albumin: 4.5 g/dL (ref 3.5–5.2)
Alkaline Phosphatase: 40 U/L (ref 39–117)
BUN: 27 mg/dL — ABNORMAL HIGH (ref 6–23)
CO2: 28 mEq/L (ref 19–32)
Calcium: 9.5 mg/dL (ref 8.4–10.5)
Chloride: 102 mEq/L (ref 96–112)
Creatinine, Ser: 1.08 mg/dL (ref 0.40–1.20)
GFR: 49.46 mL/min — ABNORMAL LOW (ref >60.00)
Glucose, Bld: 105 mg/dL — ABNORMAL HIGH (ref 70–99)
Potassium: 4.3 mEq/L (ref 3.5–5.1)
Sodium: 140 mEq/L (ref 135–145)
Total Bilirubin: 0.5 mg/dL (ref 0.2–1.2)
Total Protein: 7.5 g/dL (ref 6.0–8.3)

## 2019-06-25 LAB — LIPID PANEL
Cholesterol: 253 mg/dL — ABNORMAL HIGH (ref 0–200)
HDL: 45.6 mg/dL (ref >39.00)
NonHDL: 207.83
Total CHOL/HDL Ratio: 6
Triglycerides: 246 mg/dL — ABNORMAL HIGH (ref 0.0–149.0)
VLDL: 49.2 mg/dL — ABNORMAL HIGH (ref 0.0–40.0)

## 2019-06-25 LAB — CBC
HCT: 39.9 % (ref 36.0–46.0)
Hemoglobin: 13.5 g/dL (ref 12.0–15.0)
MCHC: 33.7 g/dL (ref 30.0–36.0)
MCV: 95.6 fl (ref 78.0–100.0)
Platelets: 132 10*3/uL — ABNORMAL LOW (ref 150.0–400.0)
RBC: 4.17 Mil/uL (ref 3.87–5.11)
RDW: 13.1 % (ref 11.5–15.5)
WBC: 3.7 10*3/uL — ABNORMAL LOW (ref 4.0–10.5)

## 2019-06-25 LAB — LDL CHOLESTEROL, DIRECT: Direct LDL: 154 mg/dL

## 2019-06-25 LAB — VITAMIN D 25 HYDROXY (VIT D DEFICIENCY, FRACTURES): VITD: 36.93 ng/mL (ref 30.00–100.00)

## 2019-06-25 MED ORDER — CITALOPRAM HYDROBROMIDE 20 MG PO TABS: 20.0000 mg | ORAL_TABLET | Freq: Every day | ORAL | 2 refills | Status: DC

## 2019-06-25 NOTE — Patient Instructions (Addendum)
Give [place] 2-3 business days to get the results of your labs back.   Keep the diet clean and stay active.  Aim to do some physical exertion for 150 minutes per week. This is typically divided into 5 days per week, 30 minutes per day. The activity should be enough to get your heart rate up. Anything is better than nothing if you have time constraints. Please consider weight resistance exercising. If the shortness of breath with exertion is not improving in the next few weeks, please let me know.   I recommend 1200 mg of calcium daily with 1000 units of Vit D daily.  Let us know if you need anything.

## 2019-06-25 NOTE — Progress Notes (Signed)
Chief Complaint  Patient presents with  . Annual Exam     Well Woman Emily Parrish is here for a complete physical.   Her last physical was >1 year ago.  Current diet: in general, a "pretty good" diet. Current exercise: none. Weight is stable and she denies daytime fatigue. Seatbelt? Yes  Pt notes over past few weeks, she will intermittently get sob when she goes to do something. It does not always come and usually only when she has a tasks. It takes her around 1 minute to catch her breath. No sig weight changes or swelling. She is not coughing or wheezing. Denies chest pain or fevers. No med changes. No cardiac hx. MF w CAD, no other heart hx in fam. She does not smoke anymore, quit >10 yrs ago.   Health Maintenance Colonoscopy- Yes Shingrix- Yes DEXA- Yes Mammogram- Yes Tetanus- Yes Pneumonia- Yes Hep C screen- Yes  Past Medical History:  Diagnosis Date  . Anxiety   . Esophageal reflux   . Fatty liver     NASH--s/p liver Bx 2011, early portal fibrosis   . Goiter   . Hiatal hernia   . OSA (obstructive sleep apnea) 12/07/2010   Apnealink 12/01/10>>AHI 9, RDI 10, SpO2 low 82%.   [name] Urethral stricture    uretheral stricture , s/p dilatation per urology ~ 2005  . Vitamin B 12 deficiency    mild   . Vitamin D deficiency      Past Surgical History:  Procedure Laterality Date  . APPENDECTOMY    . BIOPSY THYROID  2016   Consistent w/ benign follicular nodule  . ESOPHAGOGASTRODUODENOSCOPY  2005   HH    Medications  Current Outpatient Medications on File Prior to Visit  Medication Sig Dispense Refill  . citalopram (CELEXA) 20 MG tablet Take 1 tablet (20 mg total) by mouth daily. 90 tablet 1  . MILK THISTLE PO Take 1,300 mg by mouth daily.     . Multiple Vitamins-Minerals (MULTIVITAMIN PO) Take by mouth daily.     . RABEprazole (ACIPHEX) 20 MG tablet Take one tablet by mouth daily 90 tablet 3    Allergies Allergies  Allergen Reactions  . Fluticasone-Salmeterol      REACTION: rapid heart rate, medication is inhalent    Review of Systems: Constitutional:  no sweats Eye:  no recent significant change in vision Ear/Nose/Mouth/Throat:  Ears:  No changes in hearing Nose/Mouth/Throat:  no complaints of nasal congestion, no sore throat Cardiovascular: no chest pain Respiratory:  SOB as above.  Gastrointestinal:  no abdominal pain, no change in bowel habits GU:  Female: negative for dysuria or pelvic pain Musculoskeletal/Extremities:  no pain of the joints Integumentary (Skin/Breast):  no abnormal skin lesions reported Neurologic:  no headaches Psychiatric:  no anxiety, no depression Endocrine:  denies unexplained weight changes Hematologic/Lymphatic:  no abnormal bleeding  Exam BP 122/82 (BP Location: Left Arm, Patient Position: Sitting, Cuff Size: Normal)   Pulse 96   Temp (!) 95.2 F (35.1 C) (Temporal)   Ht 5' 11.5" (1.816 m)   Wt 191 lb 2 oz (86.7 kg)   LMP 07/10/1996   SpO2 94%   BMI 26.29 kg/m  General:  well developed, well nourished, in no apparent distress Skin:  no significant moles, warts, or growths Head:  no masses, lesions, or tenderness Eyes:  pupils equal and round, sclera anicteric without injection Ears:  canals without lesions, TMs shiny without retraction, no obvious effusion, no erythema Nose:  nares  patent, septum midline, mucosa normal, and no drainage or sinus tenderness Throat/Pharynx:  lips and gingiva without lesion; tongue and uvula midline; non-inflamed pharynx; no exudates or postnasal drainage Neck: neck supple without adenopathy, thyromegaly, or masses Lungs:  clear to auscultation, breath sounds equal bilaterally, no respiratory distress Cardio:  regular rate and rhythm, no bruits or LE edema Abdomen:  abdomen soft, nontender; bowel sounds normal; no masses or organomegaly Genital: Deferred Musculoskeletal:  symmetrical muscle groups noted without atrophy or deformity Extremities:  no clubbing, cyanosis,  or edema, no deformities, no skin discoloration Neuro:  gait normal; deep tendon reflexes normal and symmetric Psych: well oriented with normal range of affect and appropriate judgment/insight  Assessment and Plan  Well adult exam - Plan: CBC, Comp Met (CMET), Lipid Profile  Vitamin D deficiency - Plan: Vitamin D (25 hydroxy)  DOE (dyspnea on exertion) - Plan: EKG 12-Lead   Well 73 y.o. female. Counseled on diet and exercise. Other orders as above. DOE likely 2/2 to physical deconditioning. Will ck EKG for possible cardiac equivalent. If this does not improve, will consider stress echo/ekg depending on coverage.  Follow up in 6 mo for med ck and AWV w Glenard Haring pending the above workup. The patient voiced understanding and agreement to the plan.  [place], DO 06/25/19 8:23 AM

## 2019-07-22 ENCOUNTER — Ambulatory Visit: Payer: Medicare Other | Attending: Internal Medicine

## 2019-07-22 DIAGNOSIS — Z23 Encounter for immunization: Secondary | ICD-10-CM

## 2019-07-22 NOTE — Progress Notes (Signed)
   Covid-19 Vaccination Clinic  Name:  LAURETTA SALLAS    MRN: [id number] DOB: 12-27-1945  07/22/2019  Ms. Hughley was observed post Covid-19 immunization for 15 minutes without incidence. She was provided with Vaccine Information Sheet and instruction to access the V-Safe system.   Ms. Coulibaly was instructed to call 911 with any severe reactions post vaccine: [name] Difficulty breathing  . Swelling of your face and throat  . A fast heartbeat  . A bad rash all over your body  . Dizziness and weakness    Immunizations Administered    Name Date Dose VIS Date Route   Pfizer COVID-19 Vaccine 07/22/2019  8:56 AM 0.3 mL 06/20/2019 Intramuscular   Manufacturer: [place]   Lot: [id number]   [place]: [id number]

## 2019-07-23 ENCOUNTER — Other Ambulatory Visit: Payer: Medicare HMO

## 2019-07-30 ENCOUNTER — Encounter: Payer: Self-pay | Admitting: Family Medicine

## 2019-07-30 ENCOUNTER — Other Ambulatory Visit: Payer: Self-pay

## 2019-07-30 ENCOUNTER — Other Ambulatory Visit (INDEPENDENT_AMBULATORY_CARE_PROVIDER_SITE_OTHER): Payer: Medicare HMO

## 2019-07-30 DIAGNOSIS — E785 Hyperlipidemia, unspecified: Secondary | ICD-10-CM

## 2019-07-30 DIAGNOSIS — R739 Hyperglycemia, unspecified: Secondary | ICD-10-CM | POA: Diagnosis not present

## 2019-07-30 LAB — LIPID PANEL
Cholesterol: 256 mg/dL — ABNORMAL HIGH (ref 0–200)
HDL: 48.3 mg/dL (ref >39.00)
NonHDL: 207.85
Total CHOL/HDL Ratio: 5
Triglycerides: 299 mg/dL — ABNORMAL HIGH (ref 0.0–149.0)
VLDL: 59.8 mg/dL — ABNORMAL HIGH (ref 0.0–40.0)

## 2019-07-30 LAB — LDL CHOLESTEROL, DIRECT: Direct LDL: 154 mg/dL

## 2019-07-30 LAB — HEMOGLOBIN A1C: Hgb A1c MFr Bld: 5.8 % (ref 4.6–6.5)

## 2019-08-11 ENCOUNTER — Ambulatory Visit: Payer: Medicare HMO | Attending: Internal Medicine

## 2019-08-11 DIAGNOSIS — Z23 Encounter for immunization: Secondary | ICD-10-CM

## 2019-08-11 NOTE — Progress Notes (Signed)
   Covid-19 Vaccination Clinic  Name:  Emily Parrish    MRN: [id number] DOB: 05-21-1946  08/11/2019  Emily Parrish was observed post Covid-19 immunization for 15 minutes without incidence. She was provided with Vaccine Information Sheet and instruction to access the V-Safe system.   Emily Parrish was instructed to call 911 with any severe reactions post vaccine: [name] Difficulty breathing  . Swelling of your face and throat  . A fast heartbeat  . A bad rash all over your body  . Dizziness and weakness    Immunizations Administered    Name Date Dose VIS Date Route   Pfizer COVID-19 Vaccine 08/11/2019  8:20 AM 0.3 mL 06/20/2019 Intramuscular   Manufacturer: [place]   Lot: [id number]   [place]: [id number]

## 2019-08-19 ENCOUNTER — Encounter: Payer: Self-pay | Admitting: Family Medicine

## 2019-08-19 ENCOUNTER — Encounter: Payer: Self-pay | Admitting: Certified Nurse Midwife

## 2019-08-19 DIAGNOSIS — Z1231 Encounter for screening mammogram for malignant neoplasm of breast: Secondary | ICD-10-CM | POA: Diagnosis not present

## 2019-09-17 DIAGNOSIS — R69 Illness, unspecified: Secondary | ICD-10-CM | POA: Diagnosis not present

## 2019-09-30 ENCOUNTER — Encounter: Payer: Self-pay | Admitting: Certified Nurse Midwife

## 2019-10-08 ENCOUNTER — Ambulatory Visit: Payer: Medicare HMO | Admitting: Gastroenterology

## 2019-10-22 DIAGNOSIS — R69 Illness, unspecified: Secondary | ICD-10-CM | POA: Diagnosis not present

## 2019-12-01 ENCOUNTER — Telehealth: Payer: Self-pay | Admitting: Internal Medicine

## 2019-12-02 NOTE — Telephone Encounter (Signed)
Faxed completed form back to [facility] to get approved for Aciphex.  Sent patient a mychart message to let her know.

## 2019-12-05 ENCOUNTER — Telehealth: Payer: Self-pay

## 2019-12-05 MED ORDER — RABEPRAZOLE SODIUM 20 MG PO TBEC: DELAYED_RELEASE_TABLET | ORAL | 3 refills | Status: DC

## 2019-12-05 NOTE — Telephone Encounter (Signed)
Aciphex prior authorization approved and resent to pharmacy.  Sent patient mychart message to let her know

## 2019-12-18 ENCOUNTER — Other Ambulatory Visit: Payer: Self-pay | Admitting: Internal Medicine

## 2020-03-06 ENCOUNTER — Encounter (HOSPITAL_COMMUNITY): Payer: Self-pay

## 2020-03-06 ENCOUNTER — Emergency Department (HOSPITAL_COMMUNITY): Payer: Medicare HMO

## 2020-03-06 ENCOUNTER — Emergency Department (HOSPITAL_COMMUNITY)
Admission: EM | Admit: 2020-03-06 | Discharge: 2020-03-06 | Disposition: A | Discharge status: Home / Self Care | Payer: Medicare HMO | Attending: Emergency Medicine | Admitting: Emergency Medicine

## 2020-03-06 DIAGNOSIS — R519 Headache, unspecified: Secondary | ICD-10-CM | POA: Diagnosis not present

## 2020-03-06 DIAGNOSIS — Z79899 Other long term (current) drug therapy: Secondary | ICD-10-CM | POA: Diagnosis not present

## 2020-03-06 DIAGNOSIS — R21 Rash and other nonspecific skin eruption: Secondary | ICD-10-CM | POA: Insufficient documentation

## 2020-03-06 DIAGNOSIS — L509 Urticaria, unspecified: Secondary | ICD-10-CM | POA: Diagnosis not present

## 2020-03-06 DIAGNOSIS — R55 Syncope and collapse: Secondary | ICD-10-CM | POA: Insufficient documentation

## 2020-03-06 DIAGNOSIS — E860 Dehydration: Secondary | ICD-10-CM | POA: Diagnosis not present

## 2020-03-06 DIAGNOSIS — R42 Dizziness and giddiness: Secondary | ICD-10-CM | POA: Diagnosis not present

## 2020-03-06 DIAGNOSIS — J9811 Atelectasis: Secondary | ICD-10-CM | POA: Diagnosis not present

## 2020-03-06 DIAGNOSIS — Z87891 Personal history of nicotine dependence: Secondary | ICD-10-CM | POA: Insufficient documentation

## 2020-03-06 DIAGNOSIS — R0902 Hypoxemia: Secondary | ICD-10-CM | POA: Diagnosis not present

## 2020-03-06 DIAGNOSIS — R58 Hemorrhage, not elsewhere classified: Secondary | ICD-10-CM | POA: Diagnosis not present

## 2020-03-06 LAB — CBC WITH DIFFERENTIAL/PLATELET
Abs Immature Granulocytes: 0.02 10*3/uL (ref 0.00–0.07)
Basophils Absolute: 0 10*3/uL (ref 0.0–0.1)
Basophils Relative: 0 %
Eosinophils Absolute: 0 10*3/uL (ref 0.0–0.5)
Eosinophils Relative: 0 %
HCT: 38.7 % (ref 36.0–46.0)
Hemoglobin: 12.7 g/dL (ref 12.0–15.0)
Immature Granulocytes: 0 %
Lymphocytes Relative: 14 %
Lymphs Abs: 0.9 10*3/uL (ref 0.7–4.0)
MCH: 31.6 pg (ref 26.0–34.0)
MCHC: 32.8 g/dL (ref 30.0–36.0)
MCV: 96.3 fL (ref 80.0–100.0)
Monocytes Absolute: 0.1 10*3/uL (ref 0.1–1.0)
Monocytes Relative: 2 %
Neutro Abs: 5.3 10*3/uL (ref 1.7–7.7)
Neutrophils Relative %: 84 %
Platelets: 121 10*3/uL — ABNORMAL LOW (ref 150–400)
RBC: 4.02 MIL/uL (ref 3.87–5.11)
RDW: 13.1 % (ref 11.5–15.5)
WBC: 6.3 10*3/uL (ref 4.0–10.5)
nRBC: 0 % (ref 0.0–0.2)

## 2020-03-06 LAB — TROPONIN I (HIGH SENSITIVITY)
Troponin I (High Sensitivity): <2 ng/L (ref <18)
Troponin I (High Sensitivity): 3 ng/L (ref <18)

## 2020-03-06 LAB — PROTIME-INR
INR: 1.1 (ref 0.8–1.2)
Prothrombin Time: 14 seconds (ref 11.4–15.2)

## 2020-03-06 LAB — CBG MONITORING, ED: Glucose-Capillary: 121 mg/dL — ABNORMAL HIGH (ref 70–99)

## 2020-03-06 LAB — RAPID URINE DRUG SCREEN, HOSP PERFORMED
Amphetamines: NOT DETECTED
Barbiturates: NOT DETECTED
Benzodiazepines: NOT DETECTED
Cocaine: NOT DETECTED
Opiates: NOT DETECTED
Tetrahydrocannabinol: NOT DETECTED

## 2020-03-06 LAB — COMPREHENSIVE METABOLIC PANEL
ALT: 29 U/L (ref 0–44)
AST: 23 U/L (ref 15–41)
Albumin: 3.7 g/dL (ref 3.5–5.0)
Alkaline Phosphatase: 42 U/L (ref 38–126)
Anion gap: 11 (ref 5–15)
BUN: 27 mg/dL — ABNORMAL HIGH (ref 8–23)
CO2: 22 mmol/L (ref 22–32)
Calcium: 9.4 mg/dL (ref 8.9–10.3)
Chloride: 103 mmol/L (ref 98–111)
Creatinine, Ser: 1.67 mg/dL — ABNORMAL HIGH (ref 0.44–1.00)
GFR calc Af Amer: 34 mL/min — ABNORMAL LOW (ref >60)
GFR calc non Af Amer: 30 mL/min — ABNORMAL LOW (ref >60)
Glucose, Bld: 128 mg/dL — ABNORMAL HIGH (ref 70–99)
Potassium: 4.4 mmol/L (ref 3.5–5.1)
Sodium: 136 mmol/L (ref 135–145)
Total Bilirubin: 0.8 mg/dL (ref 0.3–1.2)
Total Protein: 6.8 g/dL (ref 6.5–8.1)

## 2020-03-06 LAB — URINALYSIS, ROUTINE W REFLEX MICROSCOPIC
Bilirubin Urine: NEGATIVE
Glucose, UA: NEGATIVE mg/dL
Hgb urine dipstick: NEGATIVE
Ketones, ur: NEGATIVE mg/dL
Nitrite: NEGATIVE
Protein, ur: NEGATIVE mg/dL
Specific Gravity, Urine: 1.01 (ref 1.005–1.030)
pH: 6 (ref 5.0–8.0)

## 2020-03-06 LAB — ETHANOL: Alcohol, Ethyl (B): <10 mg/dL (ref <10)

## 2020-03-06 MED ORDER — FAMOTIDINE IN NACL 20-0.9 MG/50ML-% IV SOLN
20.0000 mg | Freq: Once | INTRAVENOUS | Status: AC
  Administered 2020-03-06: 20 mg via INTRAVENOUS
  Filled 2020-03-06: qty 50

## 2020-03-06 MED ORDER — METHYLPREDNISOLONE SODIUM SUCC 125 MG IJ SOLR
60.0000 mg | Freq: Once | INTRAMUSCULAR | Status: AC
  Administered 2020-03-06: 60 mg via INTRAVENOUS
  Filled 2020-03-06: qty 2

## 2020-03-06 MED ORDER — EPINEPHRINE 0.3 MG/0.3ML IJ SOAJ: 0.3000 mg | INTRAMUSCULAR | 1 refills | Status: DC | PRN

## 2020-03-06 MED ORDER — FAMOTIDINE 20 MG PO TABS: 20.0000 mg | ORAL_TABLET | Freq: Two times a day (BID) | ORAL | 0 refills | Status: DC

## 2020-03-06 MED ORDER — LACTATED RINGERS IV BOLUS
1000.0000 mL | Freq: Once | INTRAVENOUS | Status: AC
  Administered 2020-03-06: 1000 mL via INTRAVENOUS

## 2020-03-06 MED ORDER — PREDNISONE 10 MG (21) PO TBPK: ORAL_TABLET | ORAL | 0 refills | Status: DC

## 2020-03-06 NOTE — ED Triage Notes (Signed)
Pt BIB [facility] for eval after allergic reaction. Pt laid down to take a nap, upon standing had syncopal episode w/ lip lac. EMS noted pt had diffuse hives and was hypotensive to 80/40. EMS admin epi IM 0.53m, 548mbenadryl IV, 300 cc NS.

## 2020-03-06 NOTE — ED Provider Notes (Signed)
[facility] Provider Note   CSN: [id number] Arrival date & time: 03/06/20  1608     History Chief Complaint  Patient presents with  . Allergic Reaction    Emily Parrish is a 74 y.o. female.  HPI      Emily Parrish is a 74 y.o. female, with a history of anxiety, presenting to the ED following a syncopal episode that occurred shortly prior to arrival.  Patient states she got out of bed, was walking down the hall, was experiencing room spinning dizziness, and then had a syncopal episode.  She does not know how long she was unconscious.  When she woke up, she was able to get up and move herself to a chair. She called her friend who advised her to call EMS. EMS reports patient's initial BP 80/40.  They noted hives as well.  Administered epinephrine IM 0.3 mg, 50 mg Benadryl IV, and 300 cc NS IV.  Patient states she noticed a pruritic rash last night when she was getting ready for bed around 11 PM.  She did not notice it before this.  She had eaten dinner several hours earlier and this was a meal that she had eaten many times before without difficulty. When she woke up this morning, she noted she felt dizzy accompanied by mild shortness of breath with standing.  Symptoms resolved with sitting.  She denies any new medications, medication changes, changes in routine, new beauty or hygiene products, new foods.  Denies fever, recent illness, chest pain, other shortness of breath, lower extremity edema/pain, palpitations, abdominal pain, N/V/D, voice change, difficulty swallowing, throat swelling, headaches, neurologic deficits, incontinence, or any other complaints.   Past Medical History:  Diagnosis Date  . Anxiety   . Esophageal reflux   . Fatty liver     NASH--s/p liver Bx 2011, early portal fibrosis   . Goiter   . Hiatal hernia   . OSA (obstructive sleep apnea) 12/07/2010   Apnealink 12/01/10>>AHI 9, RDI 10, SpO2 low 82%.   [name]  Urethral stricture    uretheral stricture , s/p dilatation per urology ~ 2005  . Vitamin B 12 deficiency    mild   . Vitamin D deficiency     Patient Active Problem List   Diagnosis Date Noted  . GAD (generalized anxiety disorder) 04/21/2019  . Vitamin D deficiency 04/21/2019  . PCP NOTES >>> 04/13/2015  . Hyperlipidemia 04/08/2014  . Special screening for malignant neoplasms, colon 02/10/2014  . DJD (degenerative joint disease) 04/30/2012  . Multinodular goiter 03/31/2012  . Annual physical exam 10/17/2011  . GERD (gastroesophageal reflux disease) 09/19/2011  . OSA (obstructive sleep apnea) 12/07/2010  . OTHER CHRONIC NONALCOHOLIC LIVER DISEASE 78/93/8101  . VITAMIN B12 DEFICIENCY 02/15/2010  . LEUKOPENIA, MILD 02/14/2010  . GERD 01/21/2009  . HIATAL HERNIA WITH REFLUX 01/18/2009  . STRICTURE, URETHRAL NOS 02/20/2007  . ANXIETY 12/10/2006  . POSTMENOPAUSAL STATUS 12/10/2006  . FATIGUE, CHRONIC 12/10/2006    Past Surgical History:  Procedure Laterality Date  . APPENDECTOMY    . BIOPSY THYROID  2016   Consistent w/ benign follicular nodule  . ESOPHAGOGASTRODUODENOSCOPY  2005   HH     OB History    Gravida  0   Para  0   Term  0   Preterm  0   AB  0   Living  0     SAB  0   TAB  0   Ectopic  0   Multiple  0   Live Births              Family History  Problem Relation Age of Onset  . Coronary artery disease Maternal Grandfather   . Breast cancer Maternal Aunt        died at age 59  . Scleroderma Mother   . Emphysema Father   . Alcohol abuse Father   . Stroke Brother   . Colon cancer Neg Hx   . Diabetes Neg Hx     Social History   Tobacco Use  . Smoking status: Former Smoker    Years: 10.00    Types: Cigarettes    Quit date: 07/11/1971    Years since quitting: 48.6  . Smokeless tobacco: Never Used  . Tobacco comment: used to smoke--1 1/2 pack per week  Vaping Use  . Vaping Use: Never used  Substance Use Topics  . Alcohol use: Yes      Alcohol/week: 5.0 - 6.0 standard drinks    Types: 5 - 6 Glasses of wine per week    Comment: social  . Drug use: No    Home Medications Prior to Admission medications   Medication Sig Start Date End Date Taking? Authorizing Provider  caffeine 200 MG TABS tablet Take 200 mg by mouth daily.    [provider]  Calcium-Magnesium-Vitamin D 300-20-200 MG-MG-UNIT CHEW Chew 1 capsule by mouth daily.    [provider]  citalopram (CELEXA) 20 MG tablet TAKE 1 TABLET DAILY 12/19/19   Shelda Pal, DO  co-enzyme Q-10 30 MG capsule Take 30 mg by mouth daily.    [provider]  Docosahexaenoic Acid (DHA OMEGA 3 PO) Take by mouth.    [provider]  EPINEPHrine (EPIPEN 2-PAK) 0.3 mg/0.3 mL IJ SOAJ injection Inject 0.3 mLs (0.3 mg total) into the muscle as needed for anaphylaxis. 03/06/20   Starlin Steib C, PA-C  famotidine (PEPCID) 20 MG tablet Take 1 tablet (20 mg total) by mouth 2 (two) times daily for 3 days. 03/06/20 03/09/20  Shamara Soza C, PA-C  LUTEIN PO Take by mouth.    [provider]  Melatonin 10 MG TABS Take by mouth.    [provider]  MILK THISTLE PO Take 1,300 mg by mouth daily.     [provider]  Multiple Vitamins-Minerals (MULTIVITAMIN PO) Take by mouth daily.     [provider]  predniSONE (STERAPRED UNI-PAK 21 TAB) 10 MG (21) TBPK tablet Take 6 tabs (5m) day 1, 5 tabs (535m day 2, 4 tabs (4056mday 3, 3 tabs (28m22may 4, 2 tabs (20mg87my 5, and 1 tab (10mg)70m 6. 03/06/20   Amparo Donalson Helane Gunther  RABEprazole (ACIPHEX) 20 MG tablet Take one tablet by mouth daily 12/05/19   Perry,Irene ShipperTURMERIC PO Take by mouth.    [provider]    Allergies    Fluticasone-salmeterol  Review of Systems   Review of Systems  Constitutional: Negative for chills, diaphoresis and fever.  HENT: Negative for facial swelling, sore throat, trouble swallowing and voice change.   Eyes: Negative for visual  disturbance.  Respiratory: Positive for shortness of breath (with standing). Negative for cough.   Cardiovascular: Negative for chest pain, palpitations and leg swelling.  Gastrointestinal: Negative for abdominal pain, diarrhea, nausea and vomiting.  Genitourinary: Negative for dysuria, flank pain, frequency and hematuria.  Musculoskeletal: Negative for back pain and neck pain.  Skin: Positive for rash and wound.  Neurological: Positive for dizziness and syncope. Negative for facial asymmetry, speech difficulty, weakness, numbness and headaches.  Psychiatric/Behavioral: Negative for confusion.  All other systems reviewed and are negative.   Physical Exam Updated Vital Signs BP (!) 103/59   Pulse 95   Temp 98.7 F (37.1 C)   Resp 20   Ht 5' 11.5" (1.816 m)   Wt 87 kg   LMP 07/10/1996   SpO2 95%   BMI 26.38 kg/m   Physical Exam Vitals and nursing note reviewed.  Constitutional:      General: She is not in acute distress.    Appearance: She is well-developed. She is not diaphoretic.  HENT:     Head: Normocephalic.     Right Ear: Tympanic membrane, ear canal and external ear normal.     Left Ear: Tympanic membrane, ear canal and external ear normal.     Nose: Nose normal.     Comments: No septal hematomas.    Mouth/Throat:     Mouth: Mucous membranes are moist.     Pharynx: Oropharynx is clear.     Comments: Small, approximately 0.25 cm laceration to the central region of the upper lip.  Does not cross the vermilion border. Even smaller laceration to the inside of the lower lip.  No through and through component noted.  Dentition appears to be intact and stable, patient agrees.  No noted area of intraoral swelling or fluctuance.  No trismus or noted abnormal phonation.  Mouth opening to at least 3 finger widths.  Handles oral secretions without difficulty.  No noted facial swelling.  No sublingual swelling or tongue elevation.  No swelling or tenderness to the submental or  submandibular regions.  No swelling or tenderness into the soft tissues of the neck. Eyes:     Conjunctiva/sclera: Conjunctivae normal.  Cardiovascular:     Rate and Rhythm: Normal rate and regular rhythm.     Pulses: Normal pulses.          Radial pulses are 2+ on the right side and 2+ on the left side.       Posterior tibial pulses are 2+ on the right side and 2+ on the left side.     Heart sounds: Normal heart sounds.     Comments: Tactile temperature in the extremities appropriate and equal bilaterally. Pulmonary:     Effort: Pulmonary effort is normal. No respiratory distress.     Breath sounds: Normal breath sounds.  Abdominal:     Palpations: Abdomen is soft.     Tenderness: There is no abdominal tenderness. There is no guarding.  Musculoskeletal:     Cervical back: Normal range of motion and neck supple.     Right lower leg: No edema.     Left lower leg: No edema.  Lymphadenopathy:     Cervical: No cervical adenopathy.  Skin:    General: Skin is warm and dry.     Findings: Rash present.     Comments: Erythematous rash noted to the extremities and torso, as shown in the photos.  No noted vesicles or pustules.  Neurological:     Mental Status: She is alert and oriented to person, place, and time.     Comments: No noted acute cognitive deficit. Sensation grossly intact to light touch in the extremities.   Grip strengths equal bilaterally.   Strength 5/5 in all extremities.  No gait disturbance. Coordination intact.  Cranial nerves III-XII grossly intact.  Handles oral secretions without noted difficulty.  No noted phonation or speech deficit. No facial droop.   Psychiatric:        Mood and Affect: Mood and affect normal.        Speech: Speech normal.        Behavior: Behavior normal.                 ED Results / Procedures / Treatments   Labs (all labs ordered are listed, but only abnormal results are displayed) Labs Reviewed  CBC WITH  DIFFERENTIAL/PLATELET - Abnormal; Notable for the following components:      Result Value   Platelets 121 (*)    All other components within normal limits  URINALYSIS, ROUTINE W REFLEX MICROSCOPIC - Abnormal; Notable for the following components:   APPearance CLOUDY (*)    Leukocytes,Ua SMALL (*)    Bacteria, UA RARE (*)    All other components within normal limits  COMPREHENSIVE METABOLIC PANEL - Abnormal; Notable for the following components:   Glucose, Bld 128 (*)    BUN 27 (*)    Creatinine, Ser 1.67 (*)    GFR calc non Af Amer 30 (*)    GFR calc Af Amer 34 (*)    All other components within normal limits  CBG MONITORING, ED - Abnormal; Notable for the following components:   Glucose-Capillary 121 (*)    All other components within normal limits  PROTIME-INR  RAPID URINE DRUG SCREEN, HOSP PERFORMED  ETHANOL  TROPONIN I (HIGH SENSITIVITY)  TROPONIN I (HIGH SENSITIVITY)    EKG EKG Interpretation  Date/Time:  Saturday March 06 2020 18:39:38 EDT Ventricular Rate:  72 PR Interval:    QRS Duration: 91 QT Interval:  434 QTC Calculation: 475 R Axis:   10 Text Interpretation: Sinus rhythm Probable left atrial enlargement No significant change since prior 3/03 Confirmed by Aletta Edouard [phone]) on 03/06/2020 6:40:57 PM   Radiology CT Head Wo Contrast  Result Date: 03/06/2020 CLINICAL DATA:  Head trauma with head pain. EXAM: CT HEAD WITHOUT CONTRAST TECHNIQUE: Contiguous axial images were obtained from the base of the skull through the vertex without intravenous contrast. COMPARISON:  None. FINDINGS: Brain: No evidence of acute infarction, hemorrhage, hydrocephalus, extra-axial collection or mass lesion/mass effect. There is mild cerebral volume loss with associated ex vacuo dilatation. Periventricular white matter hypoattenuation likely represents chronic small vessel ischemic disease. Vascular: There are vascular calcifications in the carotid siphons. Skull: Normal. Negative  for fracture or focal lesion. Sinuses/Orbits: There is right sphenoid sinus disease. Other: None. IMPRESSION: 1. No acute intracranial process. Electronically Signed   By: Zerita Boers M.D.   On: 03/06/2020 17:46   DG Chest Port 1 View  Result Date: 03/06/2020 CLINICAL DATA:  Syncope, possible allergic reaction EXAM: PORTABLE CHEST 1 VIEW COMPARISON:  Portable exam 1658 hours without priors for comparison FINDINGS: Normal heart size, mediastinal contours, and pulmonary vascularity. Atherosclerotic calcification aorta. Minimal bronchitic changes and LEFT basilar atelectasis. Lungs otherwise clear. No infiltrate, pleural effusion or pneumothorax. Bones demineralized. IMPRESSION: Minimal bronchitic changes and LEFT basilar atelectasis. Electronically Signed   By: Lavonia Dana M.D.   On: 03/06/2020 17:14    Procedures Procedures (including critical care time)  Medications Ordered in ED Medications  lactated ringers bolus 1,000 mL (0 mLs Intravenous Stopped 03/06/20 1950)  famotidine (PEPCID) IVPB 20 mg premix (0 mg Intravenous Stopped 03/06/20 1845)  methylPREDNISolone sodium succinate (SOLU-MEDROL) 125 mg/2 mL injection 60 mg (60 mg Intravenous Given 03/06/20  5)    ED Course  I have reviewed the triage vital signs and the nursing notes.  Pertinent labs & imaging results that were available during my care of the patient were reviewed by me and considered in my medical decision making (see chart for details).  Clinical Course as of Mar 08 31  Sat Mar 06, 2020  1717 Chest x-ray interpreted by me as no acute infiltrates.   [MB]  [place]ilo with Corene Cornea, pharmacist. States patient can get steroids with no worries. Her listed reaction of rapid heart rate was more likely due to the beta agonist in the listed medication rather than any steroid component.   [SJ]  1910 Patient states she continues to feel well.  Rash has significantly improved and has resolved in most places.  No itching.  No chest  pain, dizziness, shortness of breath, nausea, vomiting, diarrhea.   [SJ]    Clinical Course User Index [MB] Hayden Rasmussen, MD [SJ] Layla Maw   MDM Rules/Calculators/A&P                          Patient presents for evaluation following syncope. Patient is nontoxic appearing, afebrile, not tachycardic, not tachypneic, maintains excellent SPO2 on room air, and is in no apparent distress.  Patient's blood pressure quickly recovered from previously reported hypotension.  I have reviewed the patient's chart to obtain more information.   I reviewed and interpreted the patient's labs and radiological studies.  EKG findings: I think arrhythmia is unlikely. EKG shows sinus rhythm with no interval abnormalities such as QT prolongation or WPW. There are no findings to suggest Brugada syndrome. Cardiac monitoring in the emergency department reveals no tachycardic or bradycardic dysrhythmia. Hypertrophic cardiomyopathy was considered, but there are no clear historical elements pointing toward this. Additionally, EKG is not suggestive; i.e. the QRS voltage is not extremely large and there are no suggestive Q waves. No noted murmur on exam. CXR without cardiomegaly or other acute abnormality.  Shared decision making discussion for next steps, including discharge versus admission. Patient symptom-free for several hours during her ED course.  No symptoms with change in position or ambulation.  Patient opted for plan of discharge and close PCP follow-up.  Her rash could certainly be hives from an allergic reaction. There is an argument to be made as to the source of her hypotension.  When EMS obtained their hypotensive reading, patient states she had moved to a chair and had been sitting upright, which could mean that her hypotension was orthostatic. Given her creatinine elevation, dehydration causing her hypotension is certainly a possibility.  Patient admits to a very busy day yesterday with poor  hydration and oral intake as well as poor hydration today. There is a possibility that her hypotension was due to anaphylaxis given her rash/hives. She was observed for several hours here in the emergency department with only improvement noted.  The patient was given instructions for home care as well as return precautions. Patient voices understanding of these instructions, accepts the plan, and is comfortable with discharge.    Findings and plan of care discussed with Ronnald Ramp, MD. Dr. Melina Copa personally evaluated and examined this patient.  Vitals:   03/06/20 1618 03/06/20 1630 03/06/20 1800 03/06/20 1815  BP: (!) 103/59 90/68 107/73 110/74  Pulse: 95 92 76 70  Resp: 20  14 15   Temp: 98.7 F (37.1 C)     SpO2: 95% 97% 96% 95%  Weight:      Height:       Vitals:   03/06/20 1900 03/06/20 1915 03/06/20 1945 03/06/20 2245  BP: (!) 135/98 135/75 124/78 (!) 126/93  Pulse: 72 71 63 73  Resp: (!) 24 18 13 16   Temp:    98.1 F (36.7 C)  TempSrc:    Oral  SpO2: 98% 97% 97% 97%  Weight:      Height:         Final Clinical Impression(s) / ED Diagnoses Final diagnoses:  Syncope and collapse  Dehydration    Rx / DC Orders ED Discharge Orders         Ordered    predniSONE (STERAPRED UNI-PAK 21 TAB) 10 MG (21) TBPK tablet        03/06/20 2204    famotidine (PEPCID) 20 MG tablet  2 times daily        03/06/20 2204    EPINEPHrine (EPIPEN 2-PAK) 0.3 mg/0.3 mL IJ SOAJ injection  As needed        03/06/20 2204           Layla Maw 03/07/20 0033    Hayden Rasmussen, MD 03/07/20 [phone]

## 2020-03-06 NOTE — Discharge Instructions (Addendum)
  Dehydration  We suspect you may be dehydrated.  Symptoms of any other illness will be intensified and complicated by dehydration. Dehydration can also extend the duration of symptoms. Dehydration typically causes its own symptoms including lightheadedness, nausea, headaches, fatigue increased thirst, and generally feeling unwell. Drink plenty of fluids and get plenty of rest. You should be drinking at least half a liter of water every hour or two to stay hydrated. Electrolyte drinks (ex. Gatorade, Powerade, Pedialyte) are also encouraged. You should be drinking enough fluids to make your urine light yellow, almost clear. If this is not the case, you are not drinking enough water.  Your creatinine level was higher than normal today and your platelet level was lower than normal today.  Please have these reevaluated by your primary care provider.  Allergic Reaction Instructions:  Benadryl: Take 25 mg of Benadryl every 6 hours for the next 24 hours.  Use caution as Benadryl can make you drowsy. Pepcid: Take the Pepcid, as prescribed, over the next 3 days. Prednisone: Take prednisone, as prescribed, until finished. EpiPen: You have been prescribed an EpiPen to be used in the case of anaphylaxis. Please see the attached sheets regarding the symptoms that would cause you to have to use the EpiPen. If you have to use the EpiPen, you should always come to the emergency room immediately.  Follow-up with your primary care provider on this matter.  Allergy testing with an allergist may be warranted.  Return to the ED for worsening symptoms, shortness of breath, chest pain, palpitations, persistent vomiting, facial or throat swelling, or any other major concerns.  For prescription assistance, may try using prescription discount sites or apps, such as goodrx.com or Good Rx smart phone app.

## 2020-03-06 NOTE — ED Notes (Signed)
Pt returned from CT °

## 2020-03-09 ENCOUNTER — Ambulatory Visit (INDEPENDENT_AMBULATORY_CARE_PROVIDER_SITE_OTHER): Payer: Medicare HMO | Admitting: Family Medicine

## 2020-03-09 ENCOUNTER — Other Ambulatory Visit: Payer: Self-pay | Admitting: Physician Assistant

## 2020-03-09 ENCOUNTER — Other Ambulatory Visit: Payer: Self-pay

## 2020-03-09 ENCOUNTER — Encounter: Payer: Self-pay | Admitting: Family Medicine

## 2020-03-09 VITALS — BP 122/78 | HR 78 | Temp 97.6°F | Ht 71.5 in | Wt 196.0 lb

## 2020-03-09 DIAGNOSIS — L509 Urticaria, unspecified: Secondary | ICD-10-CM | POA: Diagnosis not present

## 2020-03-09 DIAGNOSIS — L308 Other specified dermatitis: Secondary | ICD-10-CM | POA: Diagnosis not present

## 2020-03-09 DIAGNOSIS — R55 Syncope and collapse: Secondary | ICD-10-CM | POA: Diagnosis not present

## 2020-03-09 NOTE — Progress Notes (Signed)
Chief Complaint  Patient presents with  . Hospitalization Follow-up    ED    Subjective: Patient is a 74 y.o. female here for ED f/u.  Syncopal episode on 8/28. Went to ED and was found to be hypotensive. Workup neg otherwise. Following up today. Reports she had not drank as much water prior ot episode. She does have orthostasis to some degree, but this was far worse and the next thing she knew, she was on the ground with a sore nose and was bleeding. No sob, chest pain, palpitations, recent illness, decreased PO intake otherwise noted. Increased water intake and is feeling normal again w/o syncope or presyncope.    Past Medical History:  Diagnosis Date  . Anxiety   . Esophageal reflux   . Fatty liver     NASH--s/p liver Bx 2011, early portal fibrosis   . Goiter   . Hiatal hernia   . OSA (obstructive sleep apnea) 12/07/2010   Apnealink 12/01/10>>AHI 9, RDI 10, SpO2 low 82%.   [name] Urethral stricture    uretheral stricture , s/p dilatation per urology ~ 2005  . Vitamin B 12 deficiency    mild   . Vitamin D deficiency     Objective: BP 122/78 (BP Location: Left Arm, Patient Position: Sitting, Cuff Size: Normal)   Pulse 78   Temp 97.6 F (36.4 C) (Oral)   Ht 5' 11.5" (1.816 m)   Wt 196 lb (88.9 kg)   LMP 07/10/1996   SpO2 96%   BMI 26.96 kg/m  General: Awake, appears stated age HEENT: MMM Heart: RRR, no murmurs, no bruits, no LE edema Lungs: CTAB, no rales, wheezes or rhonchi. No accessory muscle use Psych: Age appropriate judgment and insight, normal affect and mood  Assessment and Plan: Syncope, unspecified syncope type  I don't think we need to follow up on this. Vasovagal most likely 2/2 dehydration. If anything changes, she will let us know. Regarding her rash, she hadd 2 biopsies taken and received 2 injections this AM from her dermatologist.  The patient voiced understanding and agreement to the plan.  [place], DO 03/09/20  1:07 PM

## 2020-03-09 NOTE — Patient Instructions (Signed)
Stay hydrated.  I don't think we need to follow up with any specific testing at this time. If it would give you peace of mind, we can order an ultrasound of your heart (echocardiogram), but given your exam, I think this is low yield.  Please let me know if anything changes.  Let us know if you need anything.

## 2020-03-12 ENCOUNTER — Other Ambulatory Visit: Payer: Self-pay | Admitting: Family Medicine

## 2020-03-12 MED ORDER — METHYLPREDNISOLONE 4 MG PO TBPK: ORAL_TABLET | ORAL | 0 refills | Status: DC

## 2020-03-12 MED ORDER — TRAMADOL HCL 50 MG PO TABS: 50.0000 mg | ORAL_TABLET | Freq: Three times a day (TID) | ORAL | 0 refills | Status: AC | PRN

## 2020-03-22 DIAGNOSIS — L209 Atopic dermatitis, unspecified: Secondary | ICD-10-CM | POA: Diagnosis not present

## 2020-03-22 DIAGNOSIS — L501 Idiopathic urticaria: Secondary | ICD-10-CM | POA: Diagnosis not present

## 2020-03-22 DIAGNOSIS — R4182 Altered mental status, unspecified: Secondary | ICD-10-CM | POA: Diagnosis not present

## 2020-03-22 DIAGNOSIS — R21 Rash and other nonspecific skin eruption: Secondary | ICD-10-CM | POA: Diagnosis not present

## 2020-04-01 DIAGNOSIS — R21 Rash and other nonspecific skin eruption: Secondary | ICD-10-CM | POA: Diagnosis not present

## 2020-04-05 DIAGNOSIS — R69 Illness, unspecified: Secondary | ICD-10-CM | POA: Diagnosis not present

## 2020-04-16 ENCOUNTER — Ambulatory Visit: Payer: Medicare HMO | Admitting: Certified Nurse Midwife

## 2020-05-04 DIAGNOSIS — H524 Presbyopia: Secondary | ICD-10-CM | POA: Diagnosis not present

## 2020-05-04 DIAGNOSIS — H52223 Regular astigmatism, bilateral: Secondary | ICD-10-CM | POA: Diagnosis not present

## 2020-05-05 ENCOUNTER — Telehealth: Payer: Self-pay

## 2020-05-05 ENCOUNTER — Other Ambulatory Visit: Payer: Self-pay

## 2020-05-05 DIAGNOSIS — K7581 Nonalcoholic steatohepatitis (NASH): Secondary | ICD-10-CM

## 2020-05-05 NOTE — Telephone Encounter (Signed)
-----   Message from Algernon Huxley, RN sent at 04/16/2019  9:20 AM EDT ----- Regarding: [place] ABD Pt needs liver US

## 2020-05-05 NOTE — Telephone Encounter (Signed)
Pt scheduled for [place] of abd at [facility] 05/12/20@10am . Pt to arrive there at 9:45am and be NPO after midnight. Pt states she cannot keep this appt. Pt given the phone number [phone] to call and reschedule the procedure to a date that works for her.

## 2020-05-12 ENCOUNTER — Ambulatory Visit (HOSPITAL_COMMUNITY): Payer: Medicare HMO

## 2020-05-25 ENCOUNTER — Ambulatory Visit (HOSPITAL_COMMUNITY)
Admission: RE | Admit: 2020-05-25 | Discharge: 2020-05-25 | Disposition: A | Discharge status: Home / Self Care | Payer: Medicare HMO | Source: Ambulatory Visit | Attending: Internal Medicine | Admitting: Internal Medicine

## 2020-05-25 ENCOUNTER — Other Ambulatory Visit: Payer: Self-pay

## 2020-05-25 DIAGNOSIS — K7581 Nonalcoholic steatohepatitis (NASH): Secondary | ICD-10-CM

## 2020-05-25 DIAGNOSIS — K7689 Other specified diseases of liver: Secondary | ICD-10-CM | POA: Diagnosis not present

## 2020-06-29 ENCOUNTER — Ambulatory Visit (INDEPENDENT_AMBULATORY_CARE_PROVIDER_SITE_OTHER): Payer: Medicare HMO | Admitting: Family Medicine

## 2020-06-29 ENCOUNTER — Encounter: Payer: Self-pay | Admitting: Family Medicine

## 2020-06-29 ENCOUNTER — Other Ambulatory Visit: Payer: Self-pay

## 2020-06-29 VITALS — BP 122/76 | HR 66 | Temp 98.0°F | Ht 72.0 in | Wt 195.2 lb

## 2020-06-29 DIAGNOSIS — E782 Mixed hyperlipidemia: Secondary | ICD-10-CM | POA: Diagnosis not present

## 2020-06-29 DIAGNOSIS — E559 Vitamin D deficiency, unspecified: Secondary | ICD-10-CM

## 2020-06-29 DIAGNOSIS — F411 Generalized anxiety disorder: Secondary | ICD-10-CM

## 2020-06-29 DIAGNOSIS — Z5320 Procedure and treatment not carried out because of patient's decision for unspecified reasons: Secondary | ICD-10-CM | POA: Diagnosis not present

## 2020-06-29 DIAGNOSIS — R7303 Prediabetes: Secondary | ICD-10-CM | POA: Diagnosis not present

## 2020-06-29 DIAGNOSIS — Z0000 Encounter for general adult medical examination without abnormal findings: Secondary | ICD-10-CM

## 2020-06-29 DIAGNOSIS — R69 Illness, unspecified: Secondary | ICD-10-CM | POA: Diagnosis not present

## 2020-06-29 LAB — COMPREHENSIVE METABOLIC PANEL
ALT: 21 U/L (ref 0–35)
AST: 22 U/L (ref 0–37)
Albumin: 4.4 g/dL (ref 3.5–5.2)
Alkaline Phosphatase: 43 U/L (ref 39–117)
BUN: 19 mg/dL (ref 6–23)
CO2: 28 mEq/L (ref 19–32)
Calcium: 9.3 mg/dL (ref 8.4–10.5)
Chloride: 105 mEq/L (ref 96–112)
Creatinine, Ser: 1.07 mg/dL (ref 0.40–1.20)
GFR: 50.64 mL/min — ABNORMAL LOW (ref >60.00)
Glucose, Bld: 99 mg/dL (ref 70–99)
Potassium: 3.9 mEq/L (ref 3.5–5.1)
Sodium: 140 mEq/L (ref 135–145)
Total Bilirubin: 0.6 mg/dL (ref 0.2–1.2)
Total Protein: 7.3 g/dL (ref 6.0–8.3)

## 2020-06-29 LAB — LIPID PANEL
Cholesterol: 260 mg/dL — ABNORMAL HIGH (ref 0–200)
HDL: 44.3 mg/dL (ref >39.00)
NonHDL: 216.09
Total CHOL/HDL Ratio: 6
Triglycerides: 287 mg/dL — ABNORMAL HIGH (ref 0.0–149.0)
VLDL: 57.4 mg/dL — ABNORMAL HIGH (ref 0.0–40.0)

## 2020-06-29 LAB — LDL CHOLESTEROL, DIRECT: Direct LDL: 151 mg/dL

## 2020-06-29 LAB — HEMOGLOBIN A1C: Hgb A1c MFr Bld: 5.9 % (ref 4.6–6.5)

## 2020-06-29 LAB — VITAMIN D 25 HYDROXY (VIT D DEFICIENCY, FRACTURES): VITD: 33.85 ng/mL (ref 30.00–100.00)

## 2020-06-29 MED ORDER — CITALOPRAM HYDROBROMIDE 20 MG PO TABS
20.0000 mg | ORAL_TABLET | Freq: Every day | ORAL | 3 refills | Status: DC
Start: 2020-06-29 — End: 2021-07-25

## 2020-06-29 NOTE — Progress Notes (Signed)
Chief Complaint  Patient presents with  . Annual Exam     Well Woman Emily Parrish is here for a complete physical.  Her last physical was >1 year ago.  Current diet: in general, diet could be better. Current exercise: not much. Weight is stable and she denies daytime fatigue. Seatbelt? Yes  Health Maintenance Shingrix- Yes DEXA- Yes Tetanus- Yes Pneumonia- Yes Hep C screen- Yes   GAD Hx of GAD on Celexa 20 mg/d. Reports compliance, no AE's. Well controlled. Does not wish to change.  She has a hx of elevated cholesterol. Declines statins.   Past Medical History:  Diagnosis Date  . Anxiety   . Esophageal reflux   . Fatty liver     NASH--s/p liver Bx 2011, early portal fibrosis   . Goiter   . Hiatal hernia   . OSA (obstructive sleep apnea) 12/07/2010   Apnealink 12/01/10>>AHI 9, RDI 10, SpO2 low 82%.   [name] Urethral stricture    uretheral stricture , s/p dilatation per urology ~ 2005  . Vitamin B 12 deficiency    mild   . Vitamin D deficiency      Past Surgical History:  Procedure Laterality Date  . APPENDECTOMY    . BIOPSY THYROID  2016   Consistent w/ benign follicular nodule  . ESOPHAGOGASTRODUODENOSCOPY  2005   HH    Medications  Current Outpatient Medications on File Prior to Visit  Medication Sig Dispense Refill  . caffeine 200 MG TABS tablet Take 200 mg by mouth daily.    . Calcium-Magnesium-Vitamin D 300-20-200 MG-MG-UNIT CHEW Chew 1 capsule by mouth daily.    . citalopram (CELEXA) 20 MG tablet TAKE 1 TABLET DAILY 90 tablet 1  . co-enzyme Q-10 30 MG capsule Take 30 mg by mouth daily.    . Docosahexaenoic Acid (DHA OMEGA 3 PO) Take by mouth.    . EPINEPHrine (EPIPEN 2-PAK) 0.3 mg/0.3 mL IJ SOAJ injection Inject 0.3 mLs (0.3 mg total) into the muscle as needed for anaphylaxis. 1 each 1  . LUTEIN PO Take by mouth.    . Melatonin 10 MG TABS Take by mouth.    [name] MILK THISTLE PO Take 1,300 mg by mouth daily.     . Multiple Vitamins-Minerals (MULTIVITAMIN  PO) Take by mouth daily.     . RABEprazole (ACIPHEX) 20 MG tablet Take one tablet by mouth daily 90 tablet 3  . TURMERIC PO Take by mouth.      Allergies Allergies  Allergen Reactions  . Fluticasone-Salmeterol     REACTION: rapid heart rate, medication is inhalent    Review of Systems: Constitutional:  no fevers Eye:  no recent significant change in vision Ears:  No changes in hearing Nose/Mouth/Throat:  no complaints of nasal congestion, no sore throat Cardiovascular: no chest pain Respiratory:  No shortness of breath Gastrointestinal:  No change in bowel habits GU:  Female: negative for dysuria Integumentary:  no abnormal skin lesions reported Neurologic:  no headaches Endocrine:  denies unexplained weight changes  Exam BP 122/76 (BP Location: Left Arm, Patient Position: Sitting, Cuff Size: Normal)   Pulse 66   Temp 98 F (36.7 C) (Oral)   Ht 6' (1.829 m)   Wt 195 lb 4 oz (88.6 kg)   LMP 07/10/1996   SpO2 97%   BMI 26.48 kg/m  General:  well developed, well nourished, in no apparent distress Skin:  no significant moles, warts, or growths Head:  no masses, lesions, or tenderness Eyes:  pupils equal and round, sclera anicteric without injection Ears:  canals without lesions, TMs shiny without retraction, no obvious effusion, no erythema Nose:  nares patent, septum midline, mucosa normal, and no drainage or sinus tenderness Throat/Pharynx:  lips and gingiva without lesion; tongue and uvula midline; non-inflamed pharynx; no exudates or postnasal drainage Neck: neck supple without adenopathy, thyromegaly, or masses Lungs:  clear to auscultation, breath sounds equal bilaterally, no respiratory distress Cardio:  regular rate and rhythm, no bruits or LE edema Abdomen:  abdomen soft, nontender; bowel sounds normal; no masses or organomegaly Genital: Deferred Neuro:  gait normal; deep tendon reflexes normal and symmetric Psych: well oriented with normal range of affect and  appropriate judgment/insight  Assessment and Plan  Well adult exam  GAD (generalized anxiety disorder)  Prediabetes - Plan: Hemoglobin A1c  Mixed hyperlipidemia - Plan: Lipid panel, Comprehensive metabolic panel  Vitamin D insufficiency - Plan: VITAMIN D 25 Hydroxy (Vit-D Deficiency, Fractures)  Statin declined   Well 74 y.o. female. Counseled on diet and exercise. Other orders as above. GAD controlled. Continue Celexa 20 mg/d.  Discussed her cholesterol and recommendation for statin to decrease her risk of CAD/MI, stroke, and death. She voiced understanding of these risks and politely declined the medication.  Follow up in 1 yr or prn. The patient voiced understanding and agreement to the plan.  Bella Villa, DO 06/29/20 8:17 AM

## 2020-06-29 NOTE — Patient Instructions (Signed)
Give [place] 2-3 business days to get the results of your labs back.   Keep the diet clean and stay active.  Let us know if you need anything.

## 2020-07-06 ENCOUNTER — Other Ambulatory Visit: Payer: Self-pay | Admitting: Family Medicine

## 2020-07-06 DIAGNOSIS — E782 Mixed hyperlipidemia: Secondary | ICD-10-CM

## 2020-07-06 MED ORDER — ROSUVASTATIN CALCIUM 5 MG PO TABS: 5.0000 mg | ORAL_TABLET | Freq: Every day | ORAL | 3 refills | Status: DC

## 2020-07-14 DIAGNOSIS — L821 Other seborrheic keratosis: Secondary | ICD-10-CM | POA: Diagnosis not present

## 2020-07-14 DIAGNOSIS — L570 Actinic keratosis: Secondary | ICD-10-CM | POA: Diagnosis not present

## 2020-07-14 DIAGNOSIS — L718 Other rosacea: Secondary | ICD-10-CM | POA: Diagnosis not present

## 2020-07-14 DIAGNOSIS — L853 Xerosis cutis: Secondary | ICD-10-CM | POA: Diagnosis not present

## 2020-07-22 DIAGNOSIS — G4733 Obstructive sleep apnea (adult) (pediatric): Secondary | ICD-10-CM

## 2020-08-17 ENCOUNTER — Other Ambulatory Visit: Payer: Self-pay

## 2020-08-17 ENCOUNTER — Other Ambulatory Visit (INDEPENDENT_AMBULATORY_CARE_PROVIDER_SITE_OTHER): Payer: Medicare HMO

## 2020-08-17 DIAGNOSIS — E782 Mixed hyperlipidemia: Secondary | ICD-10-CM | POA: Diagnosis not present

## 2020-08-17 LAB — LDL CHOLESTEROL, DIRECT: Direct LDL: 82 mg/dL

## 2020-08-17 LAB — LIPID PANEL
Cholesterol: 161 mg/dL (ref 0–200)
HDL: 41.6 mg/dL (ref >39.00)
NonHDL: 119.69
Total CHOL/HDL Ratio: 4
Triglycerides: 247 mg/dL — ABNORMAL HIGH (ref 0.0–149.0)
VLDL: 49.4 mg/dL — ABNORMAL HIGH (ref 0.0–40.0)

## 2020-08-17 LAB — HEPATIC FUNCTION PANEL
ALT: 20 U/L (ref 0–35)
AST: 19 U/L (ref 0–37)
Albumin: 4.3 g/dL (ref 3.5–5.2)
Alkaline Phosphatase: 40 U/L (ref 39–117)
Bilirubin, Direct: 0.1 mg/dL (ref 0.0–0.3)
Total Bilirubin: 0.6 mg/dL (ref 0.2–1.2)
Total Protein: 7.3 g/dL (ref 6.0–8.3)

## 2020-08-25 ENCOUNTER — Ambulatory Visit (INDEPENDENT_AMBULATORY_CARE_PROVIDER_SITE_OTHER): Payer: Medicare HMO | Admitting: Neurology

## 2020-08-25 ENCOUNTER — Encounter: Payer: Self-pay | Admitting: Neurology

## 2020-08-25 VITALS — BP 118/82 | HR 70 | Ht 71.75 in | Wt 194.0 lb

## 2020-08-25 DIAGNOSIS — Z789 Other specified health status: Secondary | ICD-10-CM | POA: Diagnosis not present

## 2020-08-25 DIAGNOSIS — G4719 Other hypersomnia: Secondary | ICD-10-CM | POA: Diagnosis not present

## 2020-08-25 DIAGNOSIS — R682 Dry mouth, unspecified: Secondary | ICD-10-CM

## 2020-08-25 DIAGNOSIS — G4733 Obstructive sleep apnea (adult) (pediatric): Secondary | ICD-10-CM

## 2020-08-25 NOTE — Patient Instructions (Signed)

## 2020-08-25 NOTE — Progress Notes (Signed)
SLEEP MEDICINE CLINIC    Provider:  Larey Seat, MD  Primary Care Physician:  Shelda Pal, [address] [employer] [address] [place] [address]     Referring Provider: Ileene Rubens [address] [place],   [address]          Chief Complaint according to patient   Patient presents with:    . New Patient (Initial Visit)     Pt alone, rm 10. Presents today to discuss possibility of inspire being an option for her. She had a SS 2012 and was started on CPAP. She quit using CPAP because she would take it off in middle of night. She is waking up tired .Sleep apnea may still be a concern but she doesn't want to return to PAP therapy. Daily naps.       HISTORY OF PRESENT ILLNESS:  PURVA VESSELL is a 75 y.o. year old Caucasian female patient seen here upon a new referral on 08/25/2020 from Dr. [name].    I have the pleasure of seeing SYNDI PUA on 08-25-2020, a right -handed White or Caucasian female with a histor of OSA, diagnosed in 2012 and last seen at [facility] in 2017 .  She has a past medical history of Anxiety, Esophageal reflux, Fatty liver, Goiter, Hiatal hernia, OSA (obstructive sleep apnea) (12/07/2010), Urethral stricture, Vitamin B 12 deficiency, and Vitamin D deficiency.   The patient had the first sleep study in 2012, none of her studies were available in Epic : she was a patient of Dr. Halford Chessman at the time.  Sleep relevant medical history: liposuction abdominal , appendectomy , no other surgical history- none ENT wise.  Family medical /sleep history:  Elder brother with OSA, deviated septum.   Social history:  Patient is  retired from [employer] , and lives in a household alone. 2 cats, no children.   Tobacco use: none .  ETOH use ; 1 glass of wine most evenings.  Caffeine intake in form of Coffee( /) Soda( / Tea ( 3-4 glasses -any time ) or energy drinks. Regular exercise in form of walking.       Sleep  habits are as follows: The patient's dinner time is between 7  PM. The patient goes to bed at 11 PM and continues to sleep from midnight on for 7-8 hours, wakes for one bathroom break. The preferred sleep position is on either side, with the support of 1 pillow.  Dreams are reportedly frequent/vivid.  8 AM is the usual rise time. The patient wakes up spontaneously.  She reports not feeling refreshed or restored in AM, with symptoms such as dry mouth , rarely morning headaches, and residual fatigue.  Naps are taken frequently, lasting from 30 to 60 minutes and are more refreshing than nocturnal sleep.   She has not been able to use CPAP over 4 hours, she just looses it- and she tried a dental device.    Review of Systems: Out of a complete 14 system review, the patient complains of only the following symptoms, and all other reviewed systems are negative.:  Fatigue, sleepiness , may be snoring,  Insomnia - trouble to initiate sleep.    How likely are you to doze in the following situations: 0 = not likely, 1 = slight chance, 2 = moderate chance, 3 = high chance   Sitting and Reading? Watching Television? Sitting inactive in a public place (theater or  meeting)? As a passenger in a car for an hour without a break? Lying down in the afternoon when circumstances permit? Sitting and talking to someone? Sitting quietly after lunch without alcohol? In a car, while stopped for a few minutes in traffic?   Total = 8/ 24 points   FSS endorsed at 34/ 63 points.  GDS 2/ 15   Social History   Socioeconomic History  . Marital status: Single    Spouse name: Not on file  . Number of children: 0  . Years of education: college  . Highest education level: Not on file  Occupational History  . 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 Smoking status: Former Smoker    Years: 10.00    Types: Cigarettes    Quit date: 07/11/1971    Years since quitting:  49.1  . Smokeless tobacco: Never Used  . Tobacco comment: used to smoke--1 1/2 pack per week  Vaping Use  . Vaping Use: Never used  Substance and Sexual Activity  . Alcohol use: Yes    Alcohol/week: 5.0 - 6.0 standard drinks    Types: 5 - 6 Glasses of wine per week    Comment: social  . Drug use: No  . Sexual activity: Not Currently    Partners: Male    Birth control/protection: Post-menopausal  Other Topics Concern  . Not on file  Social History Narrative   Lives by herself , no family in town   emergency contact Graceann Congress [phone])   Drinks about 1-2 caffeine drinks a day    Social Determinants of Health   Financial Resource Strain: Not on file  Food Insecurity: Not on file  Transportation Needs: Not on file  Physical Activity: Not on file  Stress: Not on file  Social Connections: Not on file    Family History  Problem Relation Age of Onset  . Coronary artery disease Maternal Grandfather   . Breast cancer Maternal Aunt        died at age 5  . Scleroderma Mother   . Emphysema Father   . Alcohol abuse Father   . Stroke Brother   . Colon cancer Neg Hx   . Diabetes Neg Hx     Past Medical History:  Diagnosis Date  . Anxiety   . Esophageal reflux   . Fatty liver     NASH--s/p liver Bx 2011, early portal fibrosis   . Goiter   . Hiatal hernia   . OSA (obstructive sleep apnea) 12/07/2010   Apnealink 12/01/10>>AHI 9, RDI 10, SpO2 low 82%.   [name] Urethral stricture    uretheral stricture , s/p dilatation per urology ~ 2005  . Vitamin B 12 deficiency    mild   . Vitamin D deficiency     Past Surgical History:  Procedure Laterality Date  . APPENDECTOMY    . BIOPSY THYROID  2016   Consistent w/ benign follicular nodule  . ESOPHAGOGASTRODUODENOSCOPY  2005   [facility]     Current Outpatient Medications on File Prior to Visit  Medication Sig Dispense Refill  . caffeine 200 MG TABS tablet Take 200 mg by mouth daily.    . Calcium-Magnesium-Vitamin D 300-20-200  MG-MG-UNIT CHEW Chew 1 capsule by mouth daily.    . citalopram (CELEXA) 20 MG tablet Take 1 tablet (20 mg total) by mouth daily. 90 tablet 3  . co-enzyme Q-10 30 MG capsule Take 30 mg by mouth daily.    [name]  Docosahexaenoic Acid (DHA OMEGA 3 PO) Take by mouth.    . EPINEPHrine (EPIPEN 2-PAK) 0.3 mg/0.3 mL IJ SOAJ injection Inject 0.3 mLs (0.3 mg total) into the muscle as needed for anaphylaxis. 1 each 1  . LUTEIN PO Take by mouth.    . Melatonin 10 MG TABS Take by mouth.    [name] MILK THISTLE PO Take 1,300 mg by mouth daily.     . Multiple Vitamins-Minerals (MULTIVITAMIN PO) Take by mouth daily.     . RABEprazole (ACIPHEX) 20 MG tablet Take one tablet by mouth daily 90 tablet 3  . rosuvastatin (CRESTOR) 5 MG tablet Take 1 tablet (5 mg total) by mouth daily. 30 tablet 3  . TURMERIC PO Take by mouth.     No current facility-administered medications on file prior to visit.    Allergies  Allergen Reactions  . Fluticasone-Salmeterol     REACTION: rapid heart rate, medication is inhalent    Physical exam:  There were no vitals filed for this visit. There is no height or weight on file to calculate BMI.   Wt Readings from Last 3 Encounters:  06/29/20 195 lb 4 oz (88.6 kg)  03/09/20 196 lb (88.9 kg)  03/06/20 191 lb 12.8 oz (87 kg)     Ht Readings from Last 3 Encounters:  06/29/20 6' (1.829 m)  03/09/20 5' 11.5" (1.816 m)  03/06/20 5' 11.5" (1.816 m)      General: The patient is awake, alert and appears not in acute distress. The patient is well groomed. Head: Normocephalic, atraumatic. Neck is supple. Mallampati 1,  neck circumference: 14.5  inches .  Nasal airflow  patent.  Retrognathia is not seen.  Dental status: top bridge.  Cardiovascular:  Regular rate and cardiac rhythm by pulse,  without distended neck veins. Respiratory: Lungs are clear to auscultation.  Skin:  Without evidence of ankle edema, or rash. Trunk: The patient's posture is erect.   Neurologic exam : The patient  is awake and alert, oriented to place and time.   Memory subjective described as intact.  Attention span & concentration ability appears normal.  Speech is fluent,  without  dysarthria, dysphonia or aphasia.  Mood and affect are appropriate.   Cranial nerves: no loss of smell or taste reported  Pupils are equal and briskly reactive to light. Funduscopic exam deferred.  .  Extraocular movements in vertical and horizontal planes were intact and without nystagmus. No Diplopia. Visual fields by finger perimetry are intact. Hearing was intact to soft voice and finger rubbing.    Facial sensation intact to fine touch.  Facial motor strength is symmetric and tongue and uvula move midline.  Neck ROM : rotation, tilt and flexion extension were normal for age and shoulder shrug was symmetrical.    Motor exam:  Symmetric bulk, tone and ROM.   Normal tone without cog wheeling, symmetric grip strength .   Sensory:  Fine touch, pinprick and vibration were tested  and  normal.  Proprioception tested in the upper extremities was normal. Coordination: Rapid alternating movements in the fingers/hands were of normal speed.  The Finger-to-nose maneuver was intact without evidence of ataxia, dysmetria or tremor.  Gait and station: Patient could rise unassisted from a seated position, walked without assistive device.   Toe and heel walk were deferred.  Deep tendon reflexes: in the  upper and lower extremities are symmetric and intact.  Babinski response was deferred.        After spending a  total time of  45 minutes face to face and additional time for physical and neurologic examination, review of laboratory studies,  personal review of imaging studies, reports and results of other testing and review of referral information / records as far as provided in visit, I have established the following assessments:  I certainly feel that Mrs. Asper could be a good candidate for an inspire device - if she  still has apnea and if we can differentiate the kind and degree of sleep apnea she may currently have.   1) I will repeat a sleep study, and we can choose between PSG and HST to have a baseline.   2) I will be happy to refer to ENT after sleep study .   I would like to thank Ileene Rubens [address] [place],  [place] [address] for allowing me to meet with and to take care of this pleasant patient.   In short, LATANIA BASCOMB is presenting with a desire to look at CPAP alternatives in the treatment of OSA.    CC: I will share my notes with.  Electronically signed by: Larey Seat, MD 08/25/2020 1:11 PM  [facility] and [place] certified by The [employer] of Sleep Medicine and Diplomate of the [employer] of Sleep Medicine. Board certified In Neurology through the [place], Fellow of the [employer] of Neurology. Medical Director of [employer].

## 2020-08-30 ENCOUNTER — Other Ambulatory Visit: Payer: Self-pay | Admitting: Family Medicine

## 2020-08-30 DIAGNOSIS — E782 Mixed hyperlipidemia: Secondary | ICD-10-CM

## 2020-08-30 MED ORDER — FENOFIBRATE 48 MG PO TABS: 48.0000 mg | ORAL_TABLET | Freq: Every day | ORAL | 3 refills | Status: DC

## 2020-09-06 ENCOUNTER — Ambulatory Visit (INDEPENDENT_AMBULATORY_CARE_PROVIDER_SITE_OTHER): Payer: Medicare HMO | Admitting: Neurology

## 2020-09-06 DIAGNOSIS — Z789 Other specified health status: Secondary | ICD-10-CM

## 2020-09-06 DIAGNOSIS — G4733 Obstructive sleep apnea (adult) (pediatric): Secondary | ICD-10-CM

## 2020-09-06 DIAGNOSIS — R682 Dry mouth, unspecified: Secondary | ICD-10-CM

## 2020-09-06 DIAGNOSIS — G4719 Other hypersomnia: Secondary | ICD-10-CM

## 2020-09-09 NOTE — Progress Notes (Signed)
   [facility] (Watch PAT)  STUDY DATE: 09/06/20  STUDY DATA: 09/09/2020  DOB: 1946-02-17  MRN: [id number]  ORDERING CLINICIAN: Larey Seat, MD   REFERRING CLINICIAN: Shelda Pal, DO   CLINICAL INFORMATION/HISTORY 08/25/2020  Emily Parrish a 75 year-old Caucasian female patientwho has been seen upon referralfrom Dr. [name].  Emily Parrish has a medical history  of OSA, diagnosed in 2012 and last seen at [facility] in 2017 .  She has amedical history of Anxiety, Esophageal reflux, Fatty liver, Goiter, Hiatal hernia, OSA (obstructive sleep apnea) (12/07/2010), Urethral stricture, abdominal liposuction and appendectomy, Vitamin B 12 deficiency, and Vitamin D deficiency. The patient had the first sleep study in 2012, none of her studies were available in Epic : She was a patient of Dr. Halford Chessman at the time. She has not been able to use CPAP longer than 4 hours a night, she just " looses it-: and she takes daily naps as well- she  (unsuccessfully) had tried a dental device. The last CPAP settings were 5-15 cm water.   Epworth sleepiness score: 8/24. BMI: 26.3 kg/m Neck Circumference: 14.5"  FINDINGS:  Total Record Time (hours, min): 9 h 21 min  Total Sleep Time (hours, min):  7 h 50 min   Percent REM (%):    19.7 %   Calculated pAHI (per hour): 21.4       REM pAHI: 33.4    NREM pAHI: 18.4 Supine AHI:37.5   Oxygen Saturation (%) Mean: 94  Minimum oxygen saturation (%):        84   O2 Saturation Range (%): 44-82  O2Saturation (minutes) <=88%: 0.1 min  Pulse Mean (bpm):    59  Pulse Range (44-82)   IMPRESSION: This home sleep test confirmed the presence of moderately severe OSA (obstructive sleep apnea) with a strong REM sleep and supine sleep position exacerbation.   RECOMMENDATION: Looking only at the data and not the clinical history , I would also recommend CPAP treatment. However, the patient has tried both ;CPAP and dental  device with limited success and this leaves on INSPIRE therapy as an alternative; Her BMI would allow this therapy, her apnea is not too severe and not REM sleep dependent, I feel she could benefit.   Referral to ENT for [facility] evaluation, avoiding supine sleep in the meantime.    INTERPRETING PHYSICIAN:  Larey Seat, MD [facility] and [facility] Sleep Board certified by The [employer] of Sleep Medicine and  Fellow of the [employer] of Neurology. Medical Director of [employer].

## 2020-09-13 NOTE — Telephone Encounter (Signed)
Pt called and appointment scheduled over the phone

## 2020-09-18 NOTE — Procedures (Signed)
[facility] (Watch PAT)  STUDY DATE: 09/06/20  STUDY DATA: 09/09/2020  DOB: February 16, 1946  MRN: [id number]  ORDERING CLINICIAN: Larey Seat, MD   REFERRING CLINICIAN: Shelda Pal, DO   CLINICAL INFORMATION/HISTORY 08/25/2020  Emily Prom Rossettiis a 75 year-old Caucasian female patientwho has been seen upon referralfrom Dr. [name].  [name] has a medical history  of OSA, diagnosed in 2012 and last seen at [facility] in 2017 .  She has amedical history of Anxiety, Esophageal reflux, Fatty liver, Goiter, Hiatal hernia, OSA (obstructive sleep apnea) (12/07/2010), Urethral stricture, abdominal liposuction and appendectomy, Vitamin B 12 deficiency, and Vitamin D deficiency. The patient had the first sleep study in 2012, none of her studies were available in Epic : She was a patient of Dr. Halford Chessman at the time. She has not been able to use CPAP longer than 4 hours a night, she just " looses it-: and she takes daily naps as well- she  (unsuccessfully) had tried a dental device. The last CPAP settings were 5-15 cm water.   Epworth sleepiness score: 8/24. - that doesn't account for daily naps - realistically rather at 12/ 24 points.  BMI: 26.3 kg/m Neck Circumference: 14.5"  FINDINGS:  Total Record Time (hours, min): 9 h 21 min  Total Sleep Time (hours, min):  7 h 50 min   Percent REM (%):    19.7 %   Calculated pAHI (per hour): 21.4       REM pAHI: 33.4    NREM pAHI: 18.4 Supine AHI:37.5   Oxygen Saturation (%) Mean: 94  Minimum oxygen saturation (%):        84   O2 Saturation Range (%): 44-82  O2Saturation (minutes) <=88%: 0.1 min  Pulse Mean (bpm):    59  Pulse Range (44-82)   IMPRESSION: This home sleep test confirmed the presence of moderately severe OSA (obstructive sleep apnea) with a strong REM sleep and supine sleep position exacerbation.   RECOMMENDATION: Looking only at the data and not the clinical history , I would also recommend  CPAP treatment. However, the patient has tried both ;CPAP and dental device with limited success and this leaves on INSPIRE therapy as an alternative; Her BMI would allow this therapy, her apnea is not too severe and not REM sleep dependent, I feel she could benefit.   Referral to ENT for [facility] evaluation, avoiding supine sleep in the meantime.    INTERPRETING PHYSICIAN:  Larey Seat, MD [facility] and [facility] Sleep Board certified by The [employer] of Sleep Medicine and  Fellow of the [employer] of Neurology. Medical Director of [employer].

## 2020-09-18 NOTE — Progress Notes (Signed)
Please refer to Drs Wilburn Cornelia and Redmond Baseman, ENT. IMPRESSION: This home sleep test confirmed the presence of moderately severe OSA (obstructive sleep apnea) with a strong REM sleep and supine sleep position exacerbation. No clinically relevant degree of hypoxemia.   RECOMMENDATION: Looking only at the data and not the clinical history , I would also recommend CPAP treatment. However, the patient has tried both ;CPAP and dental device with limited success and this leaves on INSPIRE therapy as an alternative; Her BMI would allow this therapy, her apnea is not too severe and not REM sleep dependent, I feel she could benefit.   Referral to ENT for [facility] evaluation, avoiding supine sleep in the meantime.

## 2020-09-18 NOTE — Addendum Note (Signed)
Addended by: Larey Seat on: 09/18/2020 03:05 PM   Modules accepted: Orders

## 2020-09-21 ENCOUNTER — Telehealth: Payer: Self-pay | Admitting: Neurology

## 2020-09-21 NOTE — Telephone Encounter (Signed)
-----   Message from Larey Seat, MD sent at 09/18/2020  3:05 PM EST ----- Please refer to Drs Wilburn Cornelia and Redmond Baseman, ENT. IMPRESSION: This home sleep test confirmed the presence of moderately severe OSA (obstructive sleep apnea) with a strong REM sleep and supine sleep position exacerbation. No clinically relevant degree of hypoxemia.   RECOMMENDATION: Looking only at the data and not the clinical history , I would also recommend CPAP treatment. However, the patient has tried both ;CPAP and dental device with limited success and this leaves on INSPIRE therapy as an alternative; Her BMI would allow this therapy, her apnea is not too severe and not REM sleep dependent, I feel she could benefit.   Referral to ENT for [facility] evaluation, avoiding supine sleep in the meantime.

## 2020-09-21 NOTE — Telephone Encounter (Signed)
Called the patient and reviewed the sleep study results in detail.  Advised that her recommendation would be CPAP or dental device in which the patient has tried and failed already.  Patient is interested in pursuing inspire and this study will allow her to look into that option.  Will refer to ENT for them to make the final assessment on whether the patient will be a candidate.  Patient verbalized understanding of the results and had no further questions.  She will await the call from ENT.

## 2020-10-11 ENCOUNTER — Other Ambulatory Visit: Payer: Self-pay

## 2020-10-11 ENCOUNTER — Other Ambulatory Visit (INDEPENDENT_AMBULATORY_CARE_PROVIDER_SITE_OTHER): Payer: Medicare HMO

## 2020-10-11 DIAGNOSIS — E782 Mixed hyperlipidemia: Secondary | ICD-10-CM

## 2020-10-11 LAB — LIPID PANEL
Cholesterol: 167 mg/dL (ref 0–200)
HDL: 44.7 mg/dL (ref >39.00)
LDL Cholesterol: 85 mg/dL (ref 0–99)
NonHDL: 122.1
Total CHOL/HDL Ratio: 4
Triglycerides: 185 mg/dL — ABNORMAL HIGH (ref 0.0–149.0)
VLDL: 37 mg/dL (ref 0.0–40.0)

## 2020-10-12 ENCOUNTER — Encounter: Payer: Self-pay | Admitting: Neurology

## 2020-10-22 ENCOUNTER — Other Ambulatory Visit: Payer: Self-pay | Admitting: Family Medicine

## 2020-10-25 MED ORDER — ROSUVASTATIN CALCIUM 5 MG PO TABS: 5.0000 mg | ORAL_TABLET | Freq: Every day | ORAL | 3 refills | Status: DC

## 2020-11-02 DIAGNOSIS — G4733 Obstructive sleep apnea (adult) (pediatric): Secondary | ICD-10-CM | POA: Diagnosis not present

## 2020-11-02 DIAGNOSIS — Z6825 Body mass index (BMI) 25.0-25.9, adult: Secondary | ICD-10-CM | POA: Diagnosis not present

## 2020-11-02 DIAGNOSIS — Z9989 Dependence on other enabling machines and devices: Secondary | ICD-10-CM | POA: Diagnosis not present

## 2020-11-09 ENCOUNTER — Other Ambulatory Visit: Payer: Self-pay | Admitting: Otolaryngology

## 2020-11-23 ENCOUNTER — Telehealth: Payer: Self-pay

## 2020-11-23 NOTE — Telephone Encounter (Signed)
Rabeprazole Sodium approved through 07/09/2021

## 2020-11-26 DIAGNOSIS — Z1231 Encounter for screening mammogram for malignant neoplasm of breast: Secondary | ICD-10-CM | POA: Diagnosis not present

## 2020-11-26 LAB — HM MAMMOGRAPHY

## 2020-11-29 ENCOUNTER — Encounter: Payer: Self-pay | Admitting: Family Medicine

## 2020-12-03 DIAGNOSIS — R69 Illness, unspecified: Secondary | ICD-10-CM | POA: Diagnosis not present

## 2020-12-03 DIAGNOSIS — I951 Orthostatic hypotension: Secondary | ICD-10-CM | POA: Diagnosis not present

## 2020-12-03 DIAGNOSIS — Z87891 Personal history of nicotine dependence: Secondary | ICD-10-CM | POA: Diagnosis not present

## 2020-12-03 DIAGNOSIS — Z008 Encounter for other general examination: Secondary | ICD-10-CM | POA: Diagnosis not present

## 2020-12-03 DIAGNOSIS — E785 Hyperlipidemia, unspecified: Secondary | ICD-10-CM | POA: Diagnosis not present

## 2020-12-03 DIAGNOSIS — Z8249 Family history of ischemic heart disease and other diseases of the circulatory system: Secondary | ICD-10-CM | POA: Diagnosis not present

## 2020-12-03 DIAGNOSIS — K219 Gastro-esophageal reflux disease without esophagitis: Secondary | ICD-10-CM | POA: Diagnosis not present

## 2020-12-08 ENCOUNTER — Other Ambulatory Visit: Payer: Self-pay

## 2020-12-08 ENCOUNTER — Encounter (HOSPITAL_BASED_OUTPATIENT_CLINIC_OR_DEPARTMENT_OTHER): Payer: Self-pay | Admitting: Otolaryngology

## 2020-12-09 ENCOUNTER — Other Ambulatory Visit: Payer: Self-pay

## 2020-12-09 MED ORDER — FENOFIBRATE 48 MG PO TABS: 48.0000 mg | ORAL_TABLET | Freq: Every day | ORAL | 0 refills | Status: DC

## 2020-12-14 ENCOUNTER — Encounter (HOSPITAL_COMMUNITY): Payer: Self-pay | Admitting: Otolaryngology

## 2020-12-14 ENCOUNTER — Other Ambulatory Visit: Payer: Self-pay

## 2020-12-14 NOTE — Progress Notes (Signed)
Spoke with pt for pre-op call. Pt denies HTN, cardiac history or Diabetes.   Pt's surgery is scheduled as ambulatory so no Covid test is required prior to surgery. Pt denies any Covid symptoms.

## 2020-12-15 ENCOUNTER — Encounter (HOSPITAL_COMMUNITY)
Admission: RE | Disposition: A | Discharge status: Home / Self Care | Payer: Self-pay | Source: Home / Self Care | Attending: Otolaryngology

## 2020-12-15 ENCOUNTER — Ambulatory Visit (HOSPITAL_COMMUNITY)
Admission: RE | Admit: 2020-12-15 | Discharge: 2020-12-15 | Disposition: A | Discharge status: Home / Self Care | Payer: Medicare HMO | Attending: Otolaryngology | Admitting: Otolaryngology

## 2020-12-15 ENCOUNTER — Ambulatory Visit (HOSPITAL_COMMUNITY): Payer: Medicare HMO | Admitting: Certified Registered Nurse Anesthetist

## 2020-12-15 ENCOUNTER — Encounter (HOSPITAL_COMMUNITY): Payer: Self-pay | Admitting: Otolaryngology

## 2020-12-15 DIAGNOSIS — Z888 Allergy status to other drugs, medicaments and biological substances status: Secondary | ICD-10-CM | POA: Diagnosis not present

## 2020-12-15 DIAGNOSIS — G4733 Obstructive sleep apnea (adult) (pediatric): Secondary | ICD-10-CM | POA: Diagnosis not present

## 2020-12-15 DIAGNOSIS — Z87891 Personal history of nicotine dependence: Secondary | ICD-10-CM | POA: Insufficient documentation

## 2020-12-15 DIAGNOSIS — R69 Illness, unspecified: Secondary | ICD-10-CM | POA: Diagnosis not present

## 2020-12-15 DIAGNOSIS — Z79899 Other long term (current) drug therapy: Secondary | ICD-10-CM | POA: Diagnosis not present

## 2020-12-15 DIAGNOSIS — E785 Hyperlipidemia, unspecified: Secondary | ICD-10-CM | POA: Diagnosis not present

## 2020-12-15 DIAGNOSIS — E559 Vitamin D deficiency, unspecified: Secondary | ICD-10-CM | POA: Diagnosis not present

## 2020-12-15 HISTORY — PX: DRUG INDUCED ENDOSCOPY: SHX6808

## 2020-12-15 SURGERY — DRUG INDUCED SLEEP ENDOSCOPY
Anesthesia: Monitor Anesthesia Care

## 2020-12-15 MED ORDER — CHLORHEXIDINE GLUCONATE 0.12 % MT SOLN
OROMUCOSAL | Status: AC
  Administered 2020-12-15: 15 mL via OROMUCOSAL
  Filled 2020-12-15: qty 15

## 2020-12-15 MED ORDER — PROPOFOL 500 MG/50ML IV EMUL
INTRAVENOUS | Status: DC | PRN
  Administered 2020-12-15: 75 ug/kg/min via INTRAVENOUS

## 2020-12-15 MED ORDER — CHLORHEXIDINE GLUCONATE 0.12 % MT SOLN
15.0000 mL | OROMUCOSAL | Status: AC
  Filled 2020-12-15: qty 15

## 2020-12-15 MED ORDER — LACTATED RINGERS IV SOLN: INTRAVENOUS | Status: DC

## 2020-12-15 MED ORDER — OXYMETAZOLINE HCL 0.05 % NA SOLN
NASAL | Status: DC | PRN
  Administered 2020-12-15: 1

## 2020-12-15 SURGICAL SUPPLY — 3 items
KIT CLEAN ENDO (MISCELLANEOUS) ×2 IMPLANT
PACK BASIN DAY SURGERY FS (CUSTOM PROCEDURE TRAY) ×2 IMPLANT
PATTIES SURGICAL .5 X3 (DISPOSABLE) ×2 IMPLANT

## 2020-12-15 NOTE — H&P (Signed)
Emily Parrish is an 75 y.o. female.   Chief Complaint: Sleep apnea HPI: 75 year old female with sleep apnea who has not tolerated CPAP well.  She presents for sleep endoscopy.  Past Medical History:  Diagnosis Date  . Anxiety   . Esophageal reflux   . Fatty liver     NASH--s/p liver Bx 2011, early portal fibrosis   . Goiter   . Hiatal hernia   . OSA (obstructive sleep apnea) 12/07/2010    does not use CPAP  . Urethral stricture    uretheral stricture , s/p dilatation per urology ~ 2005  . Vitamin B 12 deficiency    mild   . Vitamin D deficiency     Past Surgical History:  Procedure Laterality Date  . APPENDECTOMY    . BIOPSY THYROID  2016   Consistent w/ benign follicular nodule  . ESOPHAGOGASTRODUODENOSCOPY  2005   HH    Family History  Problem Relation Age of Onset  . Coronary artery disease Maternal Grandfather   . Breast cancer Maternal Aunt        died at age 7  . Scleroderma Mother   . Emphysema Father   . Alcohol abuse Father   . Stroke Brother   . Colon cancer Neg Hx   . Diabetes Neg Hx    Social History:  reports that she quit smoking about 49 years ago. Her smoking use included cigarettes. She quit after 10.00 years of use. She has never used smokeless tobacco. She reports current alcohol use of about 5.0 - 6.0 standard drinks of alcohol per week. She reports that she does not use drugs.  Allergies:  Allergies  Allergen Reactions  . Fluticasone-Salmeterol     rapid heart rate, medication is inhalent    Medications Prior to Admission  Medication Sig Dispense Refill  . caffeine 200 MG TABS tablet Take 200 mg by mouth daily.    . Calcium-Magnesium-Vitamin D 300-20-200 MG-MG-UNIT CHEW Chew 1 capsule by mouth daily.    . citalopram (CELEXA) 20 MG tablet Take 1 tablet (20 mg total) by mouth daily. 90 tablet 3  . Coenzyme Q10 (COQ-10 PO) Take 1 tablet by mouth daily.    . Docosahexaenoic Acid (DHA OMEGA 3 PO) Take 2 capsules by mouth 2 (two) times  daily.    . fenofibrate (TRICOR) 48 MG tablet Take 1 tablet (48 mg total) by mouth daily. 90 tablet 0  . ibuprofen (ADVIL) 200 MG tablet Take 400 mg by mouth every 6 (six) hours as needed for headache or moderate pain.    [name] LUTEIN PO Take 1 capsule by mouth 2 (two) times daily.    . Melatonin 10 MG TABS Take 10 mg by mouth at bedtime.    . Milk Thistle 1000 MG CAPS Take 1,000 mg by mouth daily.    . Multiple Vitamins-Minerals (MULTIVITAMIN PO) Take 1 tablet by mouth daily.    . RABEprazole (ACIPHEX) 20 MG tablet Take one tablet by mouth daily (Patient taking differently: Take 20 mg by mouth daily.) 90 tablet 3  . rosuvastatin (CRESTOR) 5 MG tablet Take 1 tablet (5 mg total) by mouth daily. 90 tablet 3  . TURMERIC PO Take 1 tablet by mouth daily.    [name] EPINEPHrine (EPIPEN 2-PAK) 0.3 mg/0.3 mL IJ SOAJ injection Inject 0.3 mLs (0.3 mg total) into the muscle as needed for anaphylaxis. 1 each 1    No results found for this or any previous visit (from the past  48 hour(s)). No results found.  Review of Systems  All other systems reviewed and are negative.   Blood pressure 133/83, pulse 72, temperature 97.7 F (36.5 C), temperature source Oral, resp. rate 17, height 5' 11"  (1.803 m), weight 85.7 kg, last menstrual period 07/10/1996, SpO2 97 %. Physical Exam Constitutional:      Appearance: Normal appearance. She is normal weight.  HENT:     Head: Normocephalic and atraumatic.     Right Ear: External ear normal.     Left Ear: External ear normal.     Nose: Nose normal.     Mouth/Throat:     Mouth: Mucous membranes are moist.     Pharynx: Oropharynx is clear.  Eyes:     Extraocular Movements: Extraocular movements intact.     Conjunctiva/sclera: Conjunctivae normal.     Pupils: Pupils are equal, round, and reactive to light.  Cardiovascular:     Rate and Rhythm: Normal rate.  Pulmonary:     Effort: Pulmonary effort is normal.  Musculoskeletal:     Cervical back: Normal range of motion.   Skin:    General: Skin is warm and dry.  Neurological:     General: No focal deficit present.     Mental Status: She is alert and oriented to person, place, and time.  Psychiatric:        Mood and Affect: Mood normal.        Behavior: Behavior normal.        Thought Content: Thought content normal.        Judgment: Judgment normal.      Assessment/Plan Sleep apnea  To OR for DISE.  Melida Quitter, MD 12/15/2020, 8:24 AM

## 2020-12-15 NOTE — Anesthesia Postprocedure Evaluation (Signed)
Anesthesia Post Note  Patient: Emily Parrish  Procedure(s) Performed: DRUG INDUCED ENDOSCOPY (N/A )     Patient location during evaluation: PACU Anesthesia Type: MAC Level of consciousness: awake and alert Pain management: pain level controlled Vital Signs Assessment: post-procedure vital signs reviewed and stable Respiratory status: spontaneous breathing Cardiovascular status: stable Anesthetic complications: no   No complications documented.  Last Vitals:  Vitals:   12/15/20 0941 12/15/20 0956  BP: 100/62 136/79  Pulse: 61 (!) 56  Resp: 20 18  Temp: 36.4 C   SpO2: 96% 100%    Last Pain:  Vitals:   12/15/20 0941  TempSrc:   PainSc: 0-No pain                 Nolon Nations

## 2020-12-15 NOTE — Anesthesia Preprocedure Evaluation (Addendum)
Anesthesia Evaluation  Patient identified by MRN, date of birth, ID band Patient awake    Reviewed: Allergy & Precautions, NPO status , Patient's Chart, lab work & pertinent test results  Airway Mallampati: II  TM Distance: >3 FB Neck ROM: Full    Dental  (+) Dental Advisory Given, Teeth Intact   Pulmonary sleep apnea , former smoker,    Pulmonary exam normal breath sounds clear to auscultation       Cardiovascular negative cardio ROS Normal cardiovascular exam Rhythm:Regular Rate:Normal     Neuro/Psych PSYCHIATRIC DISORDERS Anxiety negative neurological ROS     GI/Hepatic Neg liver ROS, hiatal hernia, GERD  ,  Endo/Other  negative endocrine ROS  Renal/GU negative Renal ROS     Musculoskeletal  (+) Arthritis ,   Abdominal   Peds  Hematology negative hematology ROS (+)   Anesthesia Other Findings   Reproductive/Obstetrics negative OB ROS                             Anesthesia Physical Anesthesia Plan  ASA: II  Anesthesia Plan: MAC   Post-op Pain Management:    Induction: Intravenous  PONV Risk Score and Plan: 3 and Propofol infusion, TIVA, Treatment may vary due to age or medical condition and Ondansetron  Airway Management Planned: Natural Airway  Additional Equipment:   Intra-op Plan:   Post-operative Plan:   Informed Consent: I have reviewed the patients History and Physical, chart, labs and discussed the procedure including the risks, benefits and alternatives for the proposed anesthesia with the patient or authorized representative who has indicated his/her understanding and acceptance.     Dental advisory given  Plan Discussed with: CRNA  Anesthesia Plan Comments:        Anesthesia Quick Evaluation

## 2020-12-15 NOTE — Anesthesia Procedure Notes (Signed)
Procedure Name: MAC Date/Time: 12/15/2020 9:15 AM Performed by: Georgia Duff, CRNA Pre-anesthesia Checklist: Patient identified Oxygen Delivery Method: Nasal cannula Induction Type: IV induction Dental Injury: Teeth and Oropharynx as per pre-operative assessment

## 2020-12-15 NOTE — Op Note (Signed)
Preop diagnosis: Obstructive sleep apnea Postop diagnosis: same Procedure: Drug-induced sleep endoscopy Surgeon: Redmond Baseman Anesth: IV sedation Compl: None Findings: There is 100% anterior-posterior collapse at the velum making her a potential candidate for hypoglossal nerve stimulator placement.  There was also marked anterior-posterior collapse at the tongue base. Description:  After discussing risks, benefits, and alternatives, the patient was brought to the operative suite and placed on the operative table in the supine position.  Anesthesia was induced and the patient was given light sedation to simulate natural sleep. When the proper level was reached, an Afrin-soaked pledget was placed in the right nasal passage for a couple of minutes and then removed.  The fiberoptic laryngoscope was then passed to view the pharynx and larynx.  Findings are noted above and the exam was recorded.  After completion, the scope was removed and the patient was returned to anesthesia for wakeup and was moved to the recovery room in stable condition.

## 2020-12-15 NOTE — Transfer of Care (Signed)
Immediate Anesthesia Transfer of Care Note  Patient: Emily Parrish  Procedure(s) Performed: DRUG INDUCED ENDOSCOPY (N/A )  Patient Location: PACU  Anesthesia Type:MAC  Level of Consciousness: drowsy and patient cooperative  Airway & Oxygen Therapy: Patient Spontanous Breathing and Patient connected to nasal cannula oxygen  Post-op Assessment: Report given to RN and Post -op Vital signs reviewed and stable  Post vital signs: Reviewed and stable  Last Vitals:  Vitals Value Taken Time  BP 100/62 12/15/20 0941  Temp 36.4 C 12/15/20 0941  Pulse 58 12/15/20 0942  Resp 28 12/15/20 0942  SpO2 97 % 12/15/20 0942  Vitals shown include unvalidated device data.  Last Pain:  Vitals:   12/15/20 0815  TempSrc:   PainSc: 0-No pain      Patients Stated Pain Goal: 0 (94/[id number])  Complications: No complications documented.

## 2020-12-15 NOTE — Brief Op Note (Signed)
12/15/2020  9:34 AM  PATIENT:  Emily Parrish  75 y.o. female  PRE-OPERATIVE DIAGNOSIS:  Sleep Apeana  POST-OPERATIVE DIAGNOSIS:  Sleep Apeana  PROCEDURE:  Procedure(s): DRUG INDUCED ENDOSCOPY (N/A)  SURGEON:  Surgeon(s) and Role:    * Melida Quitter, MD - Primary  PHYSICIAN ASSISTANT:   ASSISTANTS: none   ANESTHESIA:   IV sedation  EBL:  None  BLOOD ADMINISTERED:none  DRAINS: none   LOCAL MEDICATIONS USED:  NONE  SPECIMEN:  No Specimen  DISPOSITION OF SPECIMEN:  N/A  COUNTS:  YES  TOURNIQUET:  * No tourniquets in log *  DICTATION: .Note written in EPIC  PLAN OF CARE: Discharge to home after PACU  PATIENT DISPOSITION:  PACU - hemodynamically stable.   Delay start of Pharmacological VTE agent (>24hrs) due to surgical blood loss or risk of bleeding: no

## 2020-12-16 ENCOUNTER — Encounter (HOSPITAL_COMMUNITY): Payer: Self-pay | Admitting: Otolaryngology

## 2021-01-03 ENCOUNTER — Encounter: Payer: Self-pay | Admitting: Family Medicine

## 2021-01-03 ENCOUNTER — Ambulatory Visit (INDEPENDENT_AMBULATORY_CARE_PROVIDER_SITE_OTHER): Payer: Medicare HMO | Admitting: Family Medicine

## 2021-01-03 ENCOUNTER — Other Ambulatory Visit: Payer: Self-pay

## 2021-01-03 VITALS — BP 120/78 | HR 79 | Temp 97.9°F | Ht 71.5 in | Wt 187.0 lb

## 2021-01-03 DIAGNOSIS — F411 Generalized anxiety disorder: Secondary | ICD-10-CM | POA: Diagnosis not present

## 2021-01-03 DIAGNOSIS — R69 Illness, unspecified: Secondary | ICD-10-CM | POA: Diagnosis not present

## 2021-01-03 DIAGNOSIS — I7789 Other specified disorders of arteries and arterioles: Secondary | ICD-10-CM | POA: Insufficient documentation

## 2021-01-03 DIAGNOSIS — E559 Vitamin D deficiency, unspecified: Secondary | ICD-10-CM | POA: Diagnosis not present

## 2021-01-03 DIAGNOSIS — E782 Mixed hyperlipidemia: Secondary | ICD-10-CM

## 2021-01-03 LAB — COMPREHENSIVE METABOLIC PANEL
ALT: 21 U/L (ref 0–35)
AST: 24 U/L (ref 0–37)
Albumin: 4.5 g/dL (ref 3.5–5.2)
Alkaline Phosphatase: 38 U/L — ABNORMAL LOW (ref 39–117)
BUN: 27 mg/dL — ABNORMAL HIGH (ref 6–23)
CO2: 26 mEq/L (ref 19–32)
Calcium: 9.5 mg/dL (ref 8.4–10.5)
Chloride: 106 mEq/L (ref 96–112)
Creatinine, Ser: 1.15 mg/dL (ref 0.40–1.20)
GFR: 46.27 mL/min — ABNORMAL LOW (ref >60.00)
Glucose, Bld: 104 mg/dL — ABNORMAL HIGH (ref 70–99)
Potassium: 4.1 mEq/L (ref 3.5–5.1)
Sodium: 141 mEq/L (ref 135–145)
Total Bilirubin: 0.7 mg/dL (ref 0.2–1.2)
Total Protein: 7.2 g/dL (ref 6.0–8.3)

## 2021-01-03 LAB — LIPID PANEL
Cholesterol: 164 mg/dL (ref 0–200)
HDL: 47 mg/dL (ref >39.00)
LDL Cholesterol: 88 mg/dL (ref 0–99)
NonHDL: 117.01
Total CHOL/HDL Ratio: 3
Triglycerides: 144 mg/dL (ref 0.0–149.0)
VLDL: 28.8 mg/dL (ref 0.0–40.0)

## 2021-01-03 LAB — VITAMIN D 25 HYDROXY (VIT D DEFICIENCY, FRACTURES): VITD: 37.29 ng/mL (ref 30.00–100.00)

## 2021-01-03 NOTE — Patient Instructions (Signed)
Give [place] 2-3 business days to get the results of your labs back.   Keep the diet clean and stay active.  Let us know if you need anything.

## 2021-01-03 NOTE — Progress Notes (Signed)
Chief Complaint  Patient presents with   Follow-up    Subjective Emily Parrish presents for f/u anxiety. Pt is currently being treated with Celexa 20 mg/d.  Reports doing well since treatment. No thoughts of harming self or others. No self-medication with alcohol, prescription drugs or illicit drugs. Pt is not following with a counselor/psychologist.  Mixed Hyperlipidemia Patient presents for mixed hyperlipidemia follow up. Currently being treated with Crestor 5 mg/d, Tricor 48 mg/d and compliance with treatment thus far has been good. She denies myalgias. She is adhering to a healthy diet. Exercise: walking, body weight resistance  The patient is not known to have coexisting coronary artery disease.  Past Medical History:  Diagnosis Date   Anxiety    Esophageal reflux    Fatty liver     NASH--s/p liver Bx 2011, early portal fibrosis    Goiter    Hiatal hernia    OSA (obstructive sleep apnea) 12/07/2010    does not use CPAP   Urethral stricture    uretheral stricture , s/p dilatation per urology ~ 2005   Vitamin B 12 deficiency    mild    Vitamin D deficiency    Allergies as of 01/03/2021       Reactions   Fluticasone-salmeterol    rapid heart rate, medication is inhalent        Medication List        Accurate as of January 03, 2021  7:19 AM. If you have any questions, ask your nurse or doctor.          caffeine 200 MG Tabs tablet Take 200 mg by mouth daily.   Calcium-Magnesium-Vitamin D 300-20-200 MG-MG-UNIT Chew Chew 1 capsule by mouth daily.   citalopram 20 MG tablet Commonly known as: CELEXA Take 1 tablet (20 mg total) by mouth daily.   COQ-10 PO Take 1 tablet by mouth daily.   DHA OMEGA 3 PO Take 2 capsules by mouth 2 (two) times daily.   EPINEPHrine 0.3 mg/0.3 mL Soaj injection Commonly known as: EpiPen 2-Pak Inject 0.3 mLs (0.3 mg total) into the muscle as needed for anaphylaxis.   fenofibrate 48 MG tablet Commonly known as:  Tricor Take 1 tablet (48 mg total) by mouth daily.   ibuprofen 200 MG tablet Commonly known as: ADVIL Take 400 mg by mouth every 6 (six) hours as needed for headache or moderate pain.   LUTEIN PO Take 1 capsule by mouth 2 (two) times daily.   Melatonin 10 MG Tabs Take 10 mg by mouth at bedtime.   Milk Thistle 1000 MG Caps Take 1,000 mg by mouth daily.   MULTIVITAMIN PO Take 1 tablet by mouth daily.   RABEprazole 20 MG tablet Commonly known as: ACIPHEX Take one tablet by mouth daily What changed:  how much to take how to take this when to take this additional instructions   rosuvastatin 5 MG tablet Commonly known as: CRESTOR Take 1 tablet (5 mg total) by mouth daily.   TURMERIC PO Take 1 tablet by mouth daily.        Exam BP 120/78   Pulse 79   Temp 97.9 F (36.6 C) (Oral)   Ht 5' 11.5" (1.816 m)   Wt 187 lb (84.8 kg)   LMP 07/10/1996   SpO2 94%   BMI 25.72 kg/m  General:  well developed, well nourished, in no apparent distress Lungs:  No respiratory distress Psych: well oriented with normal range of affect and age-appropriate judgement/insight, alert and  oriented x4.  Assessment and Plan  GAD (generalized anxiety disorder)  Mixed hyperlipidemia - Plan: Comprehensive metabolic panel, Lipid panel  Vitamin D deficiency - Plan: VITAMIN D 25 Hydroxy (Vit-D Deficiency, Fractures)  Chronic, stable. Cont Celexa 20 mg/d. Chronic, unsure of stability pending labs. Ck lipid panel. Cont Tricor 48 mg/d, Crestor 5 mg/d.  Ck Vit D She did have "Enlarged aorta" on her problem list. I looked at several US's where there was no mention of this and she has a CT chest from 2005 that did not mention this. No mention of this in care everywhere either. Will remove from problem list.  F/u in 6 mo or prn. The patient voiced understanding and agreement to the plan.  [place], DO 01/03/21 7:19 AM

## 2021-01-15 DIAGNOSIS — Z20822 Contact with and (suspected) exposure to covid-19: Secondary | ICD-10-CM | POA: Diagnosis not present

## 2021-02-07 ENCOUNTER — Other Ambulatory Visit: Payer: Self-pay | Admitting: Family Medicine

## 2021-02-07 ENCOUNTER — Other Ambulatory Visit: Payer: Self-pay | Admitting: Internal Medicine

## 2021-02-07 DIAGNOSIS — R339 Retention of urine, unspecified: Secondary | ICD-10-CM

## 2021-02-24 ENCOUNTER — Other Ambulatory Visit: Payer: Self-pay | Admitting: Family Medicine

## 2021-04-29 ENCOUNTER — Other Ambulatory Visit: Payer: Self-pay | Admitting: Internal Medicine

## 2021-05-04 DIAGNOSIS — H2513 Age-related nuclear cataract, bilateral: Secondary | ICD-10-CM | POA: Diagnosis not present

## 2021-05-04 DIAGNOSIS — Z9889 Other specified postprocedural states: Secondary | ICD-10-CM | POA: Diagnosis not present

## 2021-05-04 DIAGNOSIS — H04123 Dry eye syndrome of bilateral lacrimal glands: Secondary | ICD-10-CM | POA: Diagnosis not present

## 2021-05-11 ENCOUNTER — Other Ambulatory Visit: Payer: Self-pay | Admitting: Otolaryngology

## 2021-05-11 ENCOUNTER — Other Ambulatory Visit: Payer: Self-pay

## 2021-05-11 ENCOUNTER — Ambulatory Visit (INDEPENDENT_AMBULATORY_CARE_PROVIDER_SITE_OTHER): Payer: Medicare HMO | Admitting: Family Medicine

## 2021-05-11 ENCOUNTER — Encounter: Payer: Self-pay | Admitting: Family Medicine

## 2021-05-11 VITALS — BP 108/68 | HR 92 | Temp 98.3°F | Ht 71.5 in | Wt 189.5 lb

## 2021-05-11 DIAGNOSIS — R350 Frequency of micturition: Secondary | ICD-10-CM

## 2021-05-11 DIAGNOSIS — R300 Dysuria: Secondary | ICD-10-CM

## 2021-05-11 LAB — URINALYSIS, ROUTINE W REFLEX MICROSCOPIC
Bilirubin Urine: NEGATIVE
Hgb urine dipstick: NEGATIVE
Ketones, ur: NEGATIVE
Nitrite: POSITIVE — AB
RBC / HPF: NONE SEEN (ref 0–?)
Specific Gravity, Urine: 1.02 (ref 1.000–1.030)
Total Protein, Urine: NEGATIVE
Urine Glucose: NEGATIVE
Urobilinogen, UA: 0.2 (ref 0.0–1.0)
pH: 6 (ref 5.0–8.0)

## 2021-05-11 MED ORDER — NITROFURANTOIN MONOHYD MACRO 100 MG PO CAPS: 100.0000 mg | ORAL_CAPSULE | Freq: Two times a day (BID) | ORAL | 0 refills | Status: AC

## 2021-05-11 NOTE — Patient Instructions (Signed)
Stay hydrated.   Warning signs/symptoms: Uncontrollable nausea/vomiting, fevers, worsening symptoms despite treatment, confusion.  Give us around 2 business days to get culture back to you.  Let us know if you need anything. 

## 2021-05-11 NOTE — Progress Notes (Signed)
Chief Complaint  Patient presents with   Urinary Frequency   Dysuria    Emily Parrish is a 75 y.o. female here for possible UTI.  Duration: 2 days. Symptoms: Dysuria, urinary frequency, urinary hesitancy, urinary retention, and urgency Denies: hematuria, fever, nausea, vomiting, flank, vaginal discharge Hx of recurrent UTI? No Denies new sexual partners.  Past Medical History:  Diagnosis Date   Anxiety    Esophageal reflux    Fatty liver     NASH--s/p liver Bx 2011, early portal fibrosis    Goiter    Hiatal hernia    OSA (obstructive sleep apnea) 12/07/2010    does not use CPAP   Urethral stricture    uretheral stricture , s/p dilatation per urology ~ 2005   Vitamin B 12 deficiency    mild    Vitamin D deficiency      BP 108/68   Pulse 92   Temp 98.3 F (36.8 C) (Oral)   Ht 5' 11.5" (1.816 m)   Wt 189 lb 8 oz (86 kg)   LMP 07/10/1996   SpO2 95%   BMI 26.06 kg/m  General: Awake, alert, appears stated age Heart: RRR Lungs: CTAB, normal respiratory effort, no accessory muscle usage Abd: BS+, soft, NT, ND, no masses or organomegaly MSK: No CVA tenderness, neg Lloyd's sign Psych: Age appropriate judgment and insight  Dysuria - Plan: Urinalysis, Urine Culture, nitrofurantoin, macrocrystal-monohydrate, (MACROBID) 100 MG capsule  Frequent urination - Plan: Urinalysis, Urine Culture, nitrofurantoin, macrocrystal-monohydrate, (MACROBID) 100 MG capsule  5 d of Macrobid 100 mg bid. Ck cx.  Seek immediate care if pt starts to develop fevers, new/worsening symptoms, uncontrollable N/V. F/u prn. The patient voiced understanding and agreement to the plan.  [place]enedy, DO 05/11/21 2:41 PM

## 2021-05-13 LAB — URINE CULTURE
MICRO NUMBER:: 12583770
SPECIMEN QUALITY:: ADEQUATE

## 2021-05-25 ENCOUNTER — Other Ambulatory Visit: Payer: Self-pay | Admitting: Family Medicine

## 2021-05-31 ENCOUNTER — Other Ambulatory Visit: Payer: Self-pay

## 2021-05-31 ENCOUNTER — Encounter (HOSPITAL_BASED_OUTPATIENT_CLINIC_OR_DEPARTMENT_OTHER): Payer: Self-pay | Admitting: Otolaryngology

## 2021-06-08 ENCOUNTER — Ambulatory Visit (HOSPITAL_BASED_OUTPATIENT_CLINIC_OR_DEPARTMENT_OTHER): Payer: Medicare HMO | Admitting: Certified Registered"

## 2021-06-08 ENCOUNTER — Encounter (HOSPITAL_BASED_OUTPATIENT_CLINIC_OR_DEPARTMENT_OTHER): Payer: Self-pay | Admitting: Otolaryngology

## 2021-06-08 ENCOUNTER — Other Ambulatory Visit: Payer: Self-pay

## 2021-06-08 ENCOUNTER — Encounter (HOSPITAL_BASED_OUTPATIENT_CLINIC_OR_DEPARTMENT_OTHER)
Admission: RE | Disposition: A | Discharge status: Home / Self Care | Payer: Self-pay | Source: Home / Self Care | Attending: Otolaryngology

## 2021-06-08 ENCOUNTER — Ambulatory Visit (HOSPITAL_COMMUNITY): Payer: Medicare HMO

## 2021-06-08 ENCOUNTER — Ambulatory Visit (HOSPITAL_BASED_OUTPATIENT_CLINIC_OR_DEPARTMENT_OTHER)
Admission: RE | Admit: 2021-06-08 | Discharge: 2021-06-08 | Disposition: A | Discharge status: Home / Self Care | Payer: Medicare HMO | Attending: Otolaryngology | Admitting: Otolaryngology

## 2021-06-08 DIAGNOSIS — G4733 Obstructive sleep apnea (adult) (pediatric): Secondary | ICD-10-CM | POA: Diagnosis not present

## 2021-06-08 DIAGNOSIS — Z87891 Personal history of nicotine dependence: Secondary | ICD-10-CM | POA: Insufficient documentation

## 2021-06-08 DIAGNOSIS — E669 Obesity, unspecified: Secondary | ICD-10-CM | POA: Diagnosis not present

## 2021-06-08 DIAGNOSIS — Z9689 Presence of other specified functional implants: Secondary | ICD-10-CM | POA: Diagnosis not present

## 2021-06-08 DIAGNOSIS — K219 Gastro-esophageal reflux disease without esophagitis: Secondary | ICD-10-CM | POA: Insufficient documentation

## 2021-06-08 DIAGNOSIS — Z969 Presence of functional implant, unspecified: Secondary | ICD-10-CM | POA: Diagnosis not present

## 2021-06-08 DIAGNOSIS — Z6826 Body mass index (BMI) 26.0-26.9, adult: Secondary | ICD-10-CM | POA: Diagnosis not present

## 2021-06-08 HISTORY — PX: IMPLANTATION OF HYPOGLOSSAL NERVE STIMULATOR: SHX6827

## 2021-06-08 SURGERY — INSERTION, HYPOGLOSSAL NERVE STIMULATOR
Anesthesia: General | Site: Neck | Laterality: Right

## 2021-06-08 MED ORDER — DEXMEDETOMIDINE (PRECEDEX) IN NS 20 MCG/5ML (4 MCG/ML) IV SYRINGE
PREFILLED_SYRINGE | INTRAVENOUS | Status: DC | PRN
  Administered 2021-06-08: 12 ug via INTRAVENOUS

## 2021-06-08 MED ORDER — OXYCODONE HCL 5 MG/5ML PO SOLN: 5.0000 mg | Freq: Once | ORAL | Status: DC | PRN

## 2021-06-08 MED ORDER — LIDOCAINE-EPINEPHRINE 1 %-1:100000 IJ SOLN
INTRAMUSCULAR | Status: AC
  Filled 2021-06-08: qty 2

## 2021-06-08 MED ORDER — FENTANYL CITRATE (PF) 100 MCG/2ML IJ SOLN
INTRAMUSCULAR | Status: DC | PRN
  Administered 2021-06-08: 50 ug via INTRAVENOUS

## 2021-06-08 MED ORDER — LIDOCAINE-EPINEPHRINE 1 %-1:100000 IJ SOLN
INTRAMUSCULAR | Status: DC | PRN
  Administered 2021-06-08: 7 mL

## 2021-06-08 MED ORDER — HYDROMORPHONE HCL 1 MG/ML IJ SOLN: 0.2500 mg | INTRAMUSCULAR | Status: DC | PRN

## 2021-06-08 MED ORDER — OXYCODONE HCL 5 MG PO TABS: 5.0000 mg | ORAL_TABLET | Freq: Once | ORAL | Status: DC | PRN

## 2021-06-08 MED ORDER — LACTATED RINGERS IV SOLN: INTRAVENOUS | Status: DC

## 2021-06-08 MED ORDER — PROMETHAZINE HCL 25 MG/ML IJ SOLN: 6.2500 mg | INTRAMUSCULAR | Status: DC | PRN

## 2021-06-08 MED ORDER — FENTANYL CITRATE (PF) 100 MCG/2ML IJ SOLN
INTRAMUSCULAR | Status: AC
  Filled 2021-06-08: qty 2

## 2021-06-08 MED ORDER — ACETAMINOPHEN 325 MG PO TABS
ORAL_TABLET | ORAL | Status: AC
  Filled 2021-06-08: qty 3

## 2021-06-08 MED ORDER — PHENYLEPHRINE HCL-NACL 20-0.9 MG/250ML-% IV SOLN
INTRAVENOUS | Status: DC | PRN
  Administered 2021-06-08: 40 ug/min via INTRAVENOUS

## 2021-06-08 MED ORDER — HYDROCODONE-ACETAMINOPHEN 5-325 MG PO TABS
1.0000 | ORAL_TABLET | Freq: Four times a day (QID) | ORAL | 0 refills | Status: DC | PRN
Start: 2021-06-08 — End: 2021-11-17

## 2021-06-08 MED ORDER — 0.9 % SODIUM CHLORIDE (POUR BTL) OPTIME
TOPICAL | Status: DC | PRN
  Administered 2021-06-08: 100 mL

## 2021-06-08 MED ORDER — CEFAZOLIN SODIUM-DEXTROSE 2-4 GM/100ML-% IV SOLN
INTRAVENOUS | Status: AC
  Filled 2021-06-08: qty 100

## 2021-06-08 MED ORDER — CEFAZOLIN SODIUM-DEXTROSE 2-4 GM/100ML-% IV SOLN
2.0000 g | INTRAVENOUS | Status: AC
  Administered 2021-06-08: 2 g via INTRAVENOUS

## 2021-06-08 MED ORDER — BUPIVACAINE HCL (PF) 0.25 % IJ SOLN
INTRAMUSCULAR | Status: AC
  Filled 2021-06-08: qty 60

## 2021-06-08 MED ORDER — SUCCINYLCHOLINE CHLORIDE 200 MG/10ML IV SOSY
PREFILLED_SYRINGE | INTRAVENOUS | Status: DC | PRN
  Administered 2021-06-08: 100 mg via INTRAVENOUS

## 2021-06-08 MED ORDER — AMISULPRIDE (ANTIEMETIC) 5 MG/2ML IV SOLN: 10.0000 mg | Freq: Once | INTRAVENOUS | Status: DC | PRN

## 2021-06-08 MED ORDER — PHENYLEPHRINE HCL (PRESSORS) 10 MG/ML IV SOLN
INTRAVENOUS | Status: AC
  Filled 2021-06-08: qty 1

## 2021-06-08 MED ORDER — ACETAMINOPHEN 500 MG PO TABS
1000.0000 mg | ORAL_TABLET | Freq: Once | ORAL | Status: AC
  Administered 2021-06-08: 1000 mg via ORAL

## 2021-06-08 MED ORDER — DEXAMETHASONE SODIUM PHOSPHATE 4 MG/ML IJ SOLN
INTRAMUSCULAR | Status: DC | PRN
  Administered 2021-06-08: 5 mg via INTRAVENOUS

## 2021-06-08 MED ORDER — PROPOFOL 500 MG/50ML IV EMUL
INTRAVENOUS | Status: DC | PRN
  Administered 2021-06-08: 35 ug/kg/min via INTRAVENOUS

## 2021-06-08 MED ORDER — PROPOFOL 10 MG/ML IV BOLUS
INTRAVENOUS | Status: DC | PRN
  Administered 2021-06-08: 120 mg via INTRAVENOUS

## 2021-06-08 MED ORDER — PHENYLEPHRINE HCL (PRESSORS) 10 MG/ML IV SOLN: INTRAVENOUS | Status: DC | PRN

## 2021-06-08 SURGICAL SUPPLY — 66 items
ACC NRSTM 4 TRQ WRNCH STRL (MISCELLANEOUS)
ADH SKN CLS APL DERMABOND .7 (GAUZE/BANDAGES/DRESSINGS) ×2
BAND INSRT 18 STRL LF DISP RB (MISCELLANEOUS) ×1
BAND RUBBER #18 3X1/16 STRL (MISCELLANEOUS) ×2 IMPLANT
BLADE CLIPPER SURG (BLADE) IMPLANT
BLADE SURG 15 STRL LF DISP TIS (BLADE) ×1 IMPLANT
BLADE SURG 15 STRL SS (BLADE) ×2
CANISTER SUCT 1200ML W/VALVE (MISCELLANEOUS) ×2 IMPLANT
CORD BIPOLAR FORCEPS 12FT (ELECTRODE) ×2 IMPLANT
COVER PROBE W GEL 5X96 (DRAPES) ×2 IMPLANT
DERMABOND ADVANCED (GAUZE/BANDAGES/DRESSINGS) ×2
DERMABOND ADVANCED .7 DNX12 (GAUZE/BANDAGES/DRESSINGS) ×2 IMPLANT
DRAPE C-ARM 35X43 STRL (DRAPES) ×2 IMPLANT
DRAPE HEAD BAR (DRAPES) IMPLANT
DRAPE INCISE IOBAN 66X45 STRL (DRAPES) ×2 IMPLANT
DRAPE MICROSCOPE WILD 40.5X102 (DRAPES) ×2 IMPLANT
DRAPE UTILITY XL STRL (DRAPES) ×2 IMPLANT
DRSG TEGADERM 2-3/8X2-3/4 SM (GAUZE/BANDAGES/DRESSINGS) ×4 IMPLANT
DRSG TEGADERM 4X4.75 (GAUZE/BANDAGES/DRESSINGS) IMPLANT
ELECT COATED BLADE 2.86 ST (ELECTRODE) ×2 IMPLANT
ELECT EMG 18 NIMS (NEUROSURGERY SUPPLIES) ×2
ELECT REM PT RETURN 9FT ADLT (ELECTROSURGICAL) ×2
ELECTRODE EMG 18 NIMS (NEUROSURGERY SUPPLIES) ×1 IMPLANT
ELECTRODE REM PT RTRN 9FT ADLT (ELECTROSURGICAL) ×1 IMPLANT
FORCEPS BIPOLAR SPETZLER 8 1.0 (NEUROSURGERY SUPPLIES) ×2 IMPLANT
GAUZE 4X4 16PLY ~~LOC~~+RFID DBL (SPONGE) ×2 IMPLANT
GAUZE SPONGE 4X4 12PLY STRL (GAUZE/BANDAGES/DRESSINGS) ×2 IMPLANT
GENERATOR PULSE INSPIRE (Generator) ×2 IMPLANT
GLOVE SURG ENC MOIS LTX SZ6.5 (GLOVE) IMPLANT
GLOVE SURG ENC MOIS LTX SZ7.5 (GLOVE) ×2 IMPLANT
GLOVE SURG POLYISO LF SZ6.5 (GLOVE) ×2 IMPLANT
GLOVE SURG POLYISO LF SZ7 (GLOVE) ×2 IMPLANT
GLOVE SURG UNDER POLY LF SZ7 (GLOVE) ×6 IMPLANT
GOWN STRL REUS W/ TWL LRG LVL3 (GOWN DISPOSABLE) ×3 IMPLANT
GOWN STRL REUS W/TWL LRG LVL3 (GOWN DISPOSABLE) ×6
IV CATH 18G SAFETY (IV SOLUTION) ×2 IMPLANT
KIT NEURO ACCESSORY W/WRENCH (MISCELLANEOUS) IMPLANT
LEAD SENSING RESP INSPIRE (Lead) ×2 IMPLANT
LEAD SLEEP STIMULATION INSPIRE (Lead) ×2 IMPLANT
LOOP VESSEL MAXI BLUE (MISCELLANEOUS) ×2 IMPLANT
LOOP VESSEL MINI RED (MISCELLANEOUS) ×2 IMPLANT
MARKER SKIN DUAL TIP RULER LAB (MISCELLANEOUS) ×2 IMPLANT
NEEDLE HYPO 25X1 1.5 SAFETY (NEEDLE) ×2 IMPLANT
NS IRRIG 1000ML POUR BTL (IV SOLUTION) ×2 IMPLANT
PACK BASIN DAY SURGERY FS (CUSTOM PROCEDURE TRAY) ×2 IMPLANT
PACK ENT DAY SURGERY (CUSTOM PROCEDURE TRAY) ×2 IMPLANT
PASSER CATH 36 CODMAN DISP (NEUROSURGERY SUPPLIES) ×2 IMPLANT
PASSER CATH 38CM DISP (INSTRUMENTS) IMPLANT
PENCIL SMOKE EVACUATOR (MISCELLANEOUS) ×2 IMPLANT
PROBE NERVE STIMULATOR (NEUROSURGERY SUPPLIES) ×2 IMPLANT
REMOTE CONTROL SLEEP INSPIRE (MISCELLANEOUS) ×2 IMPLANT
SET WALTER ACTIVATION W/DRAPE (SET/KITS/TRAYS/PACK) ×2 IMPLANT
SLEEVE SCD COMPRESS KNEE MED (STOCKING) ×2 IMPLANT
SPONGE INTESTINAL PEANUT (DISPOSABLE) ×2 IMPLANT
SUT SILK 2 0 SH (SUTURE) ×2 IMPLANT
SUT SILK 3 0 REEL (SUTURE) ×2 IMPLANT
SUT SILK 3 0 SH 30 (SUTURE) ×2 IMPLANT
SUT SILK 3-0 (SUTURE) ×4
SUT SILK 3-0 RB1 30XBRD (SUTURE) ×2
SUT VIC AB 3-0 SH 27 (SUTURE) ×4
SUT VIC AB 3-0 SH 27X BRD (SUTURE) ×2 IMPLANT
SUT VIC AB 4-0 PS2 27 (SUTURE) ×4 IMPLANT
SUTURE SILK 3-0 RB1 30XBRD (SUTURE) ×2 IMPLANT
SYR 10ML LL (SYRINGE) ×2 IMPLANT
SYR BULB EAR ULCER 3OZ GRN STR (SYRINGE) ×2 IMPLANT
TOWEL GREEN STERILE FF (TOWEL DISPOSABLE) ×4 IMPLANT

## 2021-06-08 NOTE — Op Note (Signed)
PREOPERATIVE DIAGNOSIS:  Obstructive sleep apnea, BMI 26.04   POSTOPERATIVE DIAGNOSIS:  Obstructive sleep apnea, BMI 26.04   PROCEDURE:  Placement of hypoglossal nerve stimulator including placement of sensor lead and testing of stimulator.   SURGEON:  Melida Quitter, MD   ASSISTANT:  None   ANESTHESIA:  General endotracheal anesthesia.   COMPLICATIONS:  None.   INDICATIONS:  The patient is a 75 year old female with a history of obstructive sleep apnea who has not been able to tolerate CPAP.  She presents to the operating room for placement of hypoglossal nerve stimulator.   FINDINGS:  Surgical anatomy was unremarkable.  The device was tested intraoperatively and demonstrated excellent stimulation and sensor lead function.   DESCRIPTION OF PROCEDURE:  The patient was identified in the holding room, informed consent having been obtained, including discussion of risks, benefits and alternatives, the patient was brought to the operative suite and put on the operative table in  supine position.  Anesthesia was induced and the patient was intubated by the anesthesia team without difficulty.  The patient was given intravenous antibiotics during the case.  The eyes were taped closed and the bed was turned 180 degrees from  anesthesia and a shoulder roll was placed.  The right neck and right chest incisions were marked with a marking pen after measuring and injected with 1% lidocaine with 1:100,000 epinephrine.  The nerve integrity monitor was placed in the right lateral  tongue and floor of mouth and turned on during the case.  The right neck and chest were prepped and draped in sterile fashion.  The neck incision was made with a 15 blade scalpel and extended through subcutaneous tissue to the platysmal layer using Bovie  electrocautery.  Dissection was then extended down the platysma layer somewhat until it was divided more inferiorly.  This allowed direct dissection down onto the lower portion of the  submandibular gland and ultimately the digastric tendon.  The tendon  was retracted inferiorly with two vessel loops.  Further dissection along the digastric exposed the mylohyoid muscle, which was then retracted anteriorly exposing the hypoglossal nerve.  The fascia over the nerve was then divided using bipolar  electrocautery to control bleeding.  The various branches of the nerve were then identified including the C1 branch and the more distal branches off of the main trunk.  Nerve stimulator was then used to identify which branches would be included as  protrusion branches and excluded as retrusion branches.  The break point between that on the superior surface of the nerve was then identified and the inclusion portion of the nerve was then elevated allowing pocket for cuff placement.  The cuff was then  brought into the field and placed around the inclusion branches and properly positioned.  Saline was then injected under the cuff.  The anchor for this lead was then sutured to the digastric tendon using 3-0 silk suture in 2 positions.  The stimulating lead was  then fully placed in the neck and covered with a damp gauze.  The infraclavicular incision was made using a 15 blade scalpel and extended through the subcutaneous tissues using Bovie electrocautery down to the pectoralis fascia.  A pocket was then  created inferiorly for the generator.  2 stay sutures were then placed at the superior lateral extent of the wound using 2-0 silk suture and these were left in place.  Dissection was then extended through  the pectoralis major muscle overlying the second intercostal space.  Fatty tissue deep  to the muscle was swept exposing the external intercostal muscle.  This muscle was dissected more medially exposing the internal intercostal muscle.  A retractor was then gently placed between those muscles running posteriorly and the sensor lead was then placed into that pocket easily.  The anchor was then sutured  with 3-0 silk suture in 3 positions.  The other anchor was then secured on the pectoralis muscle using 3-0 silk in 2 positions.  The neck incision was then uncovered and the lead unraveled.  The tunneling pocket was started with a hemostat under the platysma muscle and extended with the tunneling down over the clavicle to the generator pocket and the stimulating lead was then pulled into  the generator pocket with a little bit of slack left in the neck.  Both leads were then cleaned off with damp gauze and dried.  Each one was placed into the generator tightening down the screws to 2 clicks on both occasions.  Both leads were tugged and  found to be in good position.  The generator was then positioned into the generator pocket and testing then commenced.  After testing demonstrated good stimulation and good sensor lead function, each wound was copiously irrigated with saline.  The chest wound was closed in the deep tissues with 3-0 Vicryl suture in simple interrupted fashion and in the subcutaneous layer using 4-0 Vicryl suture in a simple interrupted fashion.  The neck incision was closed in the platysma layer with 3-0 Vicryl suture in a  simple interrupted fashion and then in the subcutaneous layer using 4-0 Vicryl suture in a simple interrupted fashion.  Both incisions were covered with Dermabond.  The patient was then cleaned off and drapes were removed.  The nerve integrity monitor  was removed.  Each incision was then covered with gauze pressure dressing.  The patient was then returned to anesthesia for wakeup and was extubated in the recovery room in stable condition.

## 2021-06-08 NOTE — Anesthesia Procedure Notes (Signed)
Procedure Name: Intubation Date/Time: 06/08/2021 7:49 AM Performed by: Signe Colt, CRNA Pre-anesthesia Checklist: Patient identified, Emergency Drugs available, Suction available and Patient being monitored Patient Re-evaluated:Patient Re-evaluated prior to induction Oxygen Delivery Method: Circle system utilized Preoxygenation: Pre-oxygenation with 100% oxygen Induction Type: IV induction Ventilation: Mask ventilation without difficulty Laryngoscope Size: Mac and 3 Grade View: Grade I Tube type: Oral Tube size: 7.0 mm Number of attempts: 1 Airway Equipment and Method: Stylet and Oral airway Placement Confirmation: ETT inserted through vocal cords under direct vision, positive ETCO2 and breath sounds checked- equal and bilateral Secured at: 21 cm Tube secured with: Tape Dental Injury: Teeth and Oropharynx as per pre-operative assessment

## 2021-06-08 NOTE — Transfer of Care (Signed)
Immediate Anesthesia Transfer of Care Note  Patient: Emily Parrish  Procedure(s) Performed: IMPLANTATION OF HYPOGLOSSAL NERVE STIMULATOR (Right: Neck)  Patient Location: PACU  Anesthesia Type:General  Level of Consciousness: drowsy and patient cooperative  Airway & Oxygen Therapy: Patient Spontanous Breathing and Patient connected to face mask oxygen  Post-op Assessment: Report given to RN and Post -op Vital signs reviewed and stable  Post vital signs: Reviewed and stable  Last Vitals:  Vitals Value Taken Time  BP    Temp    Pulse    Resp    SpO2      Last Pain:  Vitals:   06/08/21 0642  TempSrc: Oral  PainSc: 0-No pain         Complications: No notable events documented.

## 2021-06-08 NOTE — Brief Op Note (Signed)
06/08/2021  9:28 AM  PATIENT:  Emily Parrish  75 y.o. female  PRE-OPERATIVE DIAGNOSIS:  Obstructive Sleep Apnea  POST-OPERATIVE DIAGNOSIS:  Obstructive Sleep Apnea  PROCEDURE:  Procedure(s): IMPLANTATION OF HYPOGLOSSAL NERVE STIMULATOR (Right)  SURGEON:  Surgeon(s) and Role:    Melida Quitter, MD - Primary  PHYSICIAN ASSISTANT:   ASSISTANTS: none   ANESTHESIA:   general  EBL:  Minimal   BLOOD ADMINISTERED:none  DRAINS: none   LOCAL MEDICATIONS USED:  LIDOCAINE   SPECIMEN:  No Specimen  DISPOSITION OF SPECIMEN:  N/A  COUNTS:  YES  TOURNIQUET:  * No tourniquets in log *  DICTATION: .Note written in EPIC  PLAN OF CARE: Discharge to home after PACU  PATIENT DISPOSITION:  PACU - hemodynamically stable.   Delay start of Pharmacological VTE agent (>24hrs) due to surgical blood loss or risk of bleeding: no

## 2021-06-08 NOTE — Discharge Instructions (Signed)

## 2021-06-08 NOTE — Anesthesia Postprocedure Evaluation (Signed)
Anesthesia Post Note  Patient: Emily Parrish  Procedure(s) Performed: IMPLANTATION OF HYPOGLOSSAL NERVE STIMULATOR (Right: Neck)     Patient location during evaluation: PACU Anesthesia Type: General Level of consciousness: awake and alert Pain management: pain level controlled Vital Signs Assessment: post-procedure vital signs reviewed and stable Respiratory status: spontaneous breathing, nonlabored ventilation and respiratory function stable Cardiovascular status: blood pressure returned to baseline and stable Postop Assessment: no apparent nausea or vomiting Anesthetic complications: no   No notable events documented.  Last Vitals:  Vitals:   06/08/21 1030 06/08/21 1045  BP: 137/87 140/85  Pulse: 83 81  Resp: 17 13  Temp:    SpO2: 95% 94%    Last Pain:  Vitals:   06/08/21 1113  TempSrc:   PainSc: 2                  Lynda Rainwater

## 2021-06-08 NOTE — Anesthesia Preprocedure Evaluation (Signed)
Anesthesia Evaluation  Patient identified by MRN, date of birth, ID band Patient awake    Reviewed: Allergy & Precautions, NPO status , Patient's Chart, lab work & pertinent test results  Airway Mallampati: II  TM Distance: >3 FB Neck ROM: Full    Dental  (+) Dental Advisory Given, Teeth Intact   Pulmonary sleep apnea , former smoker,    Pulmonary exam normal breath sounds clear to auscultation       Cardiovascular negative cardio ROS Normal cardiovascular exam Rhythm:Regular Rate:Normal     Neuro/Psych PSYCHIATRIC DISORDERS Anxiety negative neurological ROS     GI/Hepatic Neg liver ROS, hiatal hernia, GERD  ,  Endo/Other  negative endocrine ROS  Renal/GU negative Renal ROS     Musculoskeletal  (+) Arthritis , Osteoarthritis,    Abdominal   Peds  Hematology negative hematology ROS (+)   Anesthesia Other Findings   Reproductive/Obstetrics negative OB ROS                             Anesthesia Physical  Anesthesia Plan  ASA: II  Anesthesia Plan: General   Post-op Pain Management:    Induction: Intravenous  PONV Risk Score and Plan: 3 and Treatment may vary due to age or medical condition, Ondansetron, Dexamethasone and Midazolam  Airway Management Planned: Oral ETT  Additional Equipment:   Intra-op Plan:   Post-operative Plan: Extubation in OR  Informed Consent: I have reviewed the patients History and Physical, chart, labs and discussed the procedure including the risks, benefits and alternatives for the proposed anesthesia with the patient or authorized representative who has indicated his/her understanding and acceptance.     Dental advisory given  Plan Discussed with: CRNA  Anesthesia Plan Comments:         Anesthesia Quick Evaluation

## 2021-06-08 NOTE — H&P (Signed)
Emily Parrish is an 75 y.o. female.   Chief Complaint: Sleep apnea HPI: 75 year old female with obstructive sleep apnea who has been unable to tolerate CPAP.  Past Medical History:  Diagnosis Date   Anxiety    Esophageal reflux    Fatty liver     NASH--s/p liver Bx 2011, early portal fibrosis    Goiter    Hiatal hernia    OSA (obstructive sleep apnea) 12/07/2010    does not use CPAP   Urethral stricture    uretheral stricture , s/p dilatation per urology ~ 2005   Vitamin B 12 deficiency    mild    Vitamin D deficiency     Past Surgical History:  Procedure Laterality Date   APPENDECTOMY     BIOPSY THYROID  2016   Consistent w/ benign follicular nodule   DRUG INDUCED ENDOSCOPY N/A 12/15/2020   Procedure: DRUG INDUCED ENDOSCOPY;  Surgeon: Melida Quitter, MD;  Location: [facility] OR;  Service: ENT;  Laterality: N/A;   ESOPHAGOGASTRODUODENOSCOPY  2005   [place]    Family History  Problem Relation Age of Onset   Coronary artery disease Maternal Grandfather    Breast cancer Maternal Aunt        died at age 42   Scleroderma Mother    Emphysema Father    Alcohol abuse Father    Stroke Brother    Colon cancer Neg Hx    Diabetes Neg Hx    Social History:  reports that she quit smoking about 49 years ago. Her smoking use included cigarettes. She has never used smokeless tobacco. She reports current alcohol use of about 5.0 - 6.0 standard drinks per week. She reports that she does not use drugs.  Allergies:  Allergies  Allergen Reactions   Fluticasone-Salmeterol     rapid heart rate, medication is inhalent    Medications Prior to Admission  Medication Sig Dispense Refill   caffeine 200 MG TABS tablet Take 200 mg by mouth daily.     Calcium-Magnesium-Vitamin D 300-20-200 MG-MG-UNIT CHEW Chew 1 capsule by mouth daily.     citalopram (CELEXA) 20 MG tablet Take 1 tablet (20 mg total) by mouth daily. 90 tablet 3   Coenzyme Q10 (COQ-10 PO) Take 1 tablet by mouth daily.      Docosahexaenoic Acid (DHA OMEGA 3 PO) Take 2 capsules by mouth 2 (two) times daily.     fenofibrate (TRICOR) 48 MG tablet TAKE 1 TABLET DAILY 90 tablet 0   ibuprofen (ADVIL) 200 MG tablet Take 400 mg by mouth every 6 (six) hours as needed for headache or moderate pain.     LUTEIN PO Take 1 capsule by mouth 2 (two) times daily.     Melatonin 10 MG TABS Take 10 mg by mouth at bedtime.     Milk Thistle 1000 MG CAPS Take 1,000 mg by mouth daily.     Multiple Vitamins-Minerals (MULTIVITAMIN PO) Take 1 tablet by mouth daily.     RABEprazole (ACIPHEX) 20 MG tablet TAKE 1 TABLET DAILY; office visit for further refills 90 tablet 0   rosuvastatin (CRESTOR) 5 MG tablet Take 1 tablet (5 mg total) by mouth daily. 90 tablet 3   TURMERIC PO Take 1 tablet by mouth daily.     EPINEPHrine (EPIPEN 2-PAK) 0.3 mg/0.3 mL IJ SOAJ injection Inject 0.3 mLs (0.3 mg total) into the muscle as needed for anaphylaxis. 1 each 1    No results found for this or any previous  visit (from the past 48 hour(s)). No results found.  Review of Systems  All other systems reviewed and are negative.  Blood pressure (!) 126/92, pulse 67, temperature 98.1 F (36.7 C), temperature source Oral, resp. rate 15, height 5' 11.5" (1.816 m), weight 85.9 kg, last menstrual period 07/10/1996, SpO2 99 %. Physical Exam Constitutional:      Appearance: Normal appearance. She is normal weight.  HENT:     Head: Normocephalic and atraumatic.     Right Ear: External ear normal.     Left Ear: External ear normal.     Nose: Nose normal.     Mouth/Throat:     Mouth: Mucous membranes are moist.     Pharynx: Oropharynx is clear.  Eyes:     Extraocular Movements: Extraocular movements intact.     Conjunctiva/sclera: Conjunctivae normal.     Pupils: Pupils are equal, round, and reactive to light.  Cardiovascular:     Rate and Rhythm: Normal rate.  Pulmonary:     Effort: Pulmonary effort is normal.  Musculoskeletal:     Cervical back: Normal  range of motion.  Skin:    General: Skin is warm and dry.  Neurological:     General: No focal deficit present.     Mental Status: She is alert and oriented to person, place, and time.  Psychiatric:        Mood and Affect: Mood normal.        Behavior: Behavior normal.        Thought Content: Thought content normal.        Judgment: Judgment normal.     Assessment/Plan Obstructive sleep apnea and BMI 26.04  To OR for hypoglossal nerve stimulator placement.  Melida Quitter, MD 06/08/2021, 7:31 AM

## 2021-06-09 ENCOUNTER — Encounter (HOSPITAL_BASED_OUTPATIENT_CLINIC_OR_DEPARTMENT_OTHER): Payer: Self-pay | Admitting: Otolaryngology

## 2021-07-07 ENCOUNTER — Encounter: Payer: Self-pay | Admitting: Family Medicine

## 2021-07-07 ENCOUNTER — Ambulatory Visit (INDEPENDENT_AMBULATORY_CARE_PROVIDER_SITE_OTHER): Payer: Medicare HMO | Admitting: Family Medicine

## 2021-07-07 VITALS — BP 122/84 | HR 72 | Temp 97.7°F | Ht 71.0 in | Wt 185.6 lb

## 2021-07-07 DIAGNOSIS — E782 Mixed hyperlipidemia: Secondary | ICD-10-CM

## 2021-07-07 DIAGNOSIS — Z0000 Encounter for general adult medical examination without abnormal findings: Secondary | ICD-10-CM

## 2021-07-07 DIAGNOSIS — E559 Vitamin D deficiency, unspecified: Secondary | ICD-10-CM

## 2021-07-07 LAB — COMPREHENSIVE METABOLIC PANEL
ALT: 22 U/L (ref 0–35)
AST: 28 U/L (ref 0–37)
Albumin: 4.3 g/dL (ref 3.5–5.2)
Alkaline Phosphatase: 40 U/L (ref 39–117)
BUN: 26 mg/dL — ABNORMAL HIGH (ref 6–23)
CO2: 27 mEq/L (ref 19–32)
Calcium: 9.3 mg/dL (ref 8.4–10.5)
Chloride: 105 mEq/L (ref 96–112)
Creatinine, Ser: 1.19 mg/dL (ref 0.40–1.20)
GFR: 44.25 mL/min — ABNORMAL LOW (ref >60.00)
Glucose, Bld: 101 mg/dL — ABNORMAL HIGH (ref 70–99)
Potassium: 4.3 mEq/L (ref 3.5–5.1)
Sodium: 140 mEq/L (ref 135–145)
Total Bilirubin: 0.5 mg/dL (ref 0.2–1.2)
Total Protein: 7.3 g/dL (ref 6.0–8.3)

## 2021-07-07 LAB — VITAMIN D 25 HYDROXY (VIT D DEFICIENCY, FRACTURES): VITD: 35.22 ng/mL (ref 30.00–100.00)

## 2021-07-07 LAB — LIPID PANEL
Cholesterol: 173 mg/dL (ref 0–200)
HDL: 58 mg/dL (ref >39.00)
LDL Cholesterol: 98 mg/dL (ref 0–99)
NonHDL: 114.97
Total CHOL/HDL Ratio: 3
Triglycerides: 87 mg/dL (ref 0.0–149.0)
VLDL: 17.4 mg/dL (ref 0.0–40.0)

## 2021-07-07 NOTE — Patient Instructions (Signed)
Give [place] 2-3 business days to get the results of your labs back.   Keep the diet clean and stay active.  Let us know if you need anything.

## 2021-07-07 NOTE — Progress Notes (Signed)
Chief Complaint  Patient presents with   Annual Exam    cpe     Well Woman Emily Parrish is here for a complete physical.   Her last physical was >1 year ago.  Current diet: in general, a "healthy" diet. Current exercise: walking. Weight is stable and she denies daytime fatigue. Seatbelt? Yes Advanced directive? Yes  Health Maintenance Shingrix- Yes DEXA- Yes Tetanus- Yes Pneumonia- Yes Hep C screen- Yes  Past Medical History:  Diagnosis Date   Anxiety    Esophageal reflux    Fatty liver     NASH--s/p liver Bx 2011, early portal fibrosis    Goiter    Hiatal hernia    OSA (obstructive sleep apnea) 12/07/2010    does not use CPAP   Urethral stricture    uretheral stricture , s/p dilatation per urology ~ 2005   Vitamin B 12 deficiency    mild    Vitamin D deficiency      Past Surgical History:  Procedure Laterality Date   APPENDECTOMY     BIOPSY THYROID  2016   Consistent w/ benign follicular nodule   DRUG INDUCED ENDOSCOPY N/A 12/15/2020   Procedure: DRUG INDUCED ENDOSCOPY;  Surgeon: Melida Quitter, MD;  Location: [place];  Service: ENT;  Laterality: N/A;   ESOPHAGOGASTRODUODENOSCOPY  2005   [place] Right 06/08/2021   Procedure: IMPLANTATION OF HYPOGLOSSAL NERVE STIMULATOR;  Surgeon: Melida Quitter, MD;  Location: [place];  Service: ENT;  Laterality: Right;    Medications  Current Outpatient Medications on File Prior to Visit  Medication Sig Dispense Refill   caffeine 200 MG TABS tablet Take 200 mg by mouth daily.     Calcium-Magnesium-Vitamin D 300-20-200 MG-MG-UNIT CHEW Chew 1 capsule by mouth daily.     citalopram (CELEXA) 20 MG tablet Take 1 tablet (20 mg total) by mouth daily. 90 tablet 3   Coenzyme Q10 (COQ-10 PO) Take 1 tablet by mouth daily.     Docosahexaenoic Acid (DHA OMEGA 3 PO) Take 2 capsules by mouth 2 (two) times daily.     EPINEPHrine (EPIPEN 2-PAK) 0.3 mg/0.3 mL IJ SOAJ injection  Inject 0.3 mLs (0.3 mg total) into the muscle as needed for anaphylaxis. 1 each 1   fenofibrate (TRICOR) 48 MG tablet TAKE 1 TABLET DAILY 90 tablet 0   HYDROcodone-acetaminophen (NORCO/VICODIN) 5-325 MG tablet Take 1-2 tablets by mouth every 6 (six) hours as needed for moderate pain. 12 tablet 0   ibuprofen (ADVIL) 200 MG tablet Take 400 mg by mouth every 6 (six) hours as needed for headache or moderate pain.     LUTEIN PO Take 1 capsule by mouth 2 (two) times daily.     Melatonin 10 MG TABS Take 10 mg by mouth at bedtime.     Milk Thistle 1000 MG CAPS Take 1,000 mg by mouth daily.     Multiple Vitamins-Minerals (MULTIVITAMIN PO) Take 1 tablet by mouth daily.     RABEprazole (ACIPHEX) 20 MG tablet TAKE 1 TABLET DAILY; office visit for further refills 90 tablet 0   rosuvastatin (CRESTOR) 5 MG tablet Take 1 tablet (5 mg total) by mouth daily. 90 tablet 3   TURMERIC PO Take 1 tablet by mouth daily.     Allergies Allergies  Allergen Reactions   Fluticasone-Salmeterol     rapid heart rate, medication is inhalent    Review of Systems: Constitutional:  no fevers Eye:  no recent significant change  in vision Ears:  No changes in hearing Nose/Mouth/Throat:  no complaints of nasal congestion, no sore throat Cardiovascular: no chest pain Respiratory:  No shortness of breath Gastrointestinal:  No change in bowel habits GU:  Female: negative for dysuria Integumentary:  no abnormal skin lesions reported Neurologic:  no headaches Endocrine:  denies unexplained weight changes  Exam BP 122/84    Pulse 72    Temp 97.7 F (36.5 C) (Oral)    Ht 5' 11"  (1.803 m)    Wt 185 lb 9.6 oz (84.2 kg)    LMP 07/10/1996    SpO2 96%    BMI 25.89 kg/m  General:  well developed, well nourished, in no apparent distress Skin:  no significant moles, warts, or growths Head:  no masses, lesions, or tenderness Eyes:  pupils equal and round, sclera anicteric without injection Ears:  canals without lesions, TMs shiny  without retraction, no obvious effusion, no erythema Nose:  nares patent, septum midline, mucosa normal, and no drainage or sinus tenderness Throat/Pharynx:  lips and gingiva without lesion; tongue and uvula midline; non-inflamed pharynx; no exudates or postnasal drainage Neck: neck supple without adenopathy, thyromegaly, or masses Lungs:  clear to auscultation, breath sounds equal bilaterally, no respiratory distress Cardio:  regular rate and rhythm, no bruits or LE edema Abdomen:  abdomen soft, nontender; bowel sounds normal; no masses or organomegaly Genital: Deferred Neuro:  gait normal; deep tendon reflexes normal and symmetric Psych: well oriented with normal range of affect and appropriate judgment/insight  Assessment and Plan  Well adult exam  Mixed hyperlipidemia - Plan: Comprehensive metabolic panel, Lipid panel  Vitamin D deficiency - Plan: VITAMIN D 25 Hydroxy (Vit-D Deficiency, Fractures)   Well 75 y.o. female. Counseled on diet and exercise. Other orders as above. Follow up in 6 mo or prn. The patient voiced understanding and agreement to the plan.  [place], DO 07/07/21 9:09 AM

## 2021-07-14 ENCOUNTER — Encounter: Payer: Self-pay | Admitting: Family Medicine

## 2021-07-14 DIAGNOSIS — D492 Neoplasm of unspecified behavior of bone, soft tissue, and skin: Secondary | ICD-10-CM | POA: Diagnosis not present

## 2021-07-14 DIAGNOSIS — L853 Xerosis cutis: Secondary | ICD-10-CM | POA: Diagnosis not present

## 2021-07-14 DIAGNOSIS — L821 Other seborrheic keratosis: Secondary | ICD-10-CM | POA: Diagnosis not present

## 2021-07-14 DIAGNOSIS — L718 Other rosacea: Secondary | ICD-10-CM | POA: Diagnosis not present

## 2021-07-14 DIAGNOSIS — D225 Melanocytic nevi of trunk: Secondary | ICD-10-CM | POA: Diagnosis not present

## 2021-07-14 DIAGNOSIS — L814 Other melanin hyperpigmentation: Secondary | ICD-10-CM | POA: Diagnosis not present

## 2021-07-14 DIAGNOSIS — I8393 Asymptomatic varicose veins of bilateral lower extremities: Secondary | ICD-10-CM | POA: Diagnosis not present

## 2021-07-18 ENCOUNTER — Ambulatory Visit: Payer: Medicare HMO | Admitting: Internal Medicine

## 2021-07-18 ENCOUNTER — Encounter: Payer: Self-pay | Admitting: Internal Medicine

## 2021-07-18 VITALS — BP 116/82 | HR 65 | Ht 71.0 in | Wt 187.0 lb

## 2021-07-18 DIAGNOSIS — K7581 Nonalcoholic steatohepatitis (NASH): Secondary | ICD-10-CM | POA: Diagnosis not present

## 2021-07-18 DIAGNOSIS — K219 Gastro-esophageal reflux disease without esophagitis: Secondary | ICD-10-CM

## 2021-07-18 MED ORDER — RABEPRAZOLE SODIUM 20 MG PO TBEC: DELAYED_RELEASE_TABLET | ORAL | 3 refills | Status: DC

## 2021-07-18 NOTE — Progress Notes (Signed)
HISTORY OF PRESENT ILLNESS:  Emily Parrish is a 76 y.o. female with chronic GERD and biopsy-proven Emily Parrish.  She presents today for follow-up.  He was last seen in the office April 02, 2019.  See that dictation.  At the time of that visit the patient had successfully lost 25 pounds.  Since that time her liver tests have been repeatedly normal including about 7 months ago.  She does have chronic GERD for which she takes Aciphex 20 mg daily.  On medication no symptoms.  Off medication, symptom breakthrough.  She denies any upper or lower GI complaints.  Her last liver ultrasound November 2021 revealed hepatic steatosis but was otherwise unremarkable.  REVIEW OF SYSTEMS:  All non-GI ROS negative unless otherwise stated in the HPI except for sleeping problems  Past Medical History:  Diagnosis Date   Anxiety    Esophageal reflux    Fatty liver     NASH--s/p liver Bx 2011, early portal fibrosis    Goiter    Hiatal hernia    OSA (obstructive sleep apnea) 12/07/2010    does not use CPAP   Urethral stricture    uretheral stricture , s/p dilatation per urology ~ 2005   Vitamin B 12 deficiency    mild    Vitamin D deficiency     Past Surgical History:  Procedure Laterality Date   APPENDECTOMY     BIOPSY THYROID  2016   Consistent w/ benign follicular nodule   DRUG INDUCED ENDOSCOPY N/A 12/15/2020   Procedure: DRUG INDUCED ENDOSCOPY;  Surgeon: Melida Quitter, MD;  Location: [facility] OR;  Service: ENT;  Laterality: N/A;   ESOPHAGOGASTRODUODENOSCOPY  2005   [place] Right 06/08/2021   Procedure: IMPLANTATION OF HYPOGLOSSAL NERVE STIMULATOR;  Surgeon: Melida Quitter, MD;  Location: [place];  Service: ENT;  Laterality: Right;    Social History Emily Parrish  reports that she quit smoking about 50 years ago. Her smoking use included cigarettes. She has never used smokeless tobacco. She reports current alcohol use of about 5.0 - 6.0  standard drinks per week. She reports that she does not use drugs.  family history includes Alcohol abuse in her father; Breast cancer in her maternal aunt; Coronary artery disease in her maternal grandfather; Emphysema in her father; Scleroderma in her mother; Stroke in her brother.  Allergies  Allergen Reactions   Fluticasone-Salmeterol     rapid heart rate, medication is inhalent       PHYSICAL EXAMINATION: Vital signs: BP 116/82    Pulse 65    Ht 5' 11"  (1.803 m)    Wt 187 lb (84.8 kg)    LMP 07/10/1996    SpO2 98%    BMI 26.08 kg/m   Constitutional: generally well-appearing, no acute distress Psychiatric: alert and oriented x3, cooperative Eyes: extraocular movements intact, anicteric, conjunctiva pink Mouth: oral pharynx moist, no lesions Neck: supple no lymphadenopathy Cardiovascular: heart regular rate and rhythm, no murmur Lungs: clear to auscultation bilaterally Abdomen: soft, nontender, nondistended, no obvious ascites, no peritoneal signs, normal bowel sounds, no organomegaly Rectal: Omitted Extremities: no clubbing, cyanosis, or lower extremity edema bilaterally Skin: no lesions on visible extremities Neuro: No focal deficits. No asterixis.     ASSESSMENT:  1.  Chronic GERD.  Symptoms controlled with Aciphex 2.  Biopsy-proven [place].  Normalization of liver tests with weight loss. 3.  Current BMI 26  PLAN:  1.  Reflux precautions 2.  Refill Aciphex  20 mg daily.  Medication risks reviewed 3.  Continue with exercise and maintaining current body weight 4.  Routine GI follow-up 2 years.  Contact the office in the interim for any questions or problems

## 2021-07-18 NOTE — Patient Instructions (Signed)
If you are age 78 or older, your body mass index should be between 23-30. Your Body mass index is 26.08 kg/m. If this is out of the aforementioned range listed, please consider follow up with your Primary Care Provider.  If you are age 57 or younger, your body mass index should be between 19-25. Your Body mass index is 26.08 kg/m. If this is out of the aformentioned range listed, please consider follow up with your Primary Care Provider.   ________________________________________________________  The [place] [facility] providers would like to encourage you to use [facility] to communicate with providers for non-urgent requests or questions.  Due to long hold times on the telephone, sending your provider a message by [facility] may be a faster and more efficient way to get a response.  Please allow 48 business hours for a response.  Please remember that this is for non-urgent requests.  _______________________________________________________  We have sent the following medications to your pharmacy for you to pick up at your convenience:  Aciphex  Please follow up in 2 years

## 2021-07-24 ENCOUNTER — Other Ambulatory Visit: Payer: Self-pay | Admitting: Family Medicine

## 2021-07-29 ENCOUNTER — Telehealth: Payer: Self-pay | Admitting: Neurology

## 2021-07-29 ENCOUNTER — Encounter: Payer: Self-pay | Admitting: Neurology

## 2021-07-29 ENCOUNTER — Ambulatory Visit: Payer: Medicare HMO | Admitting: Neurology

## 2021-07-29 ENCOUNTER — Telehealth: Payer: Self-pay

## 2021-07-29 VITALS — BP 116/78 | HR 71

## 2021-07-29 DIAGNOSIS — G4733 Obstructive sleep apnea (adult) (pediatric): Secondary | ICD-10-CM

## 2021-07-29 DIAGNOSIS — Z9682 Presence of neurostimulator: Secondary | ICD-10-CM

## 2021-07-29 DIAGNOSIS — Z789 Other specified health status: Secondary | ICD-10-CM | POA: Diagnosis not present

## 2021-07-29 NOTE — Progress Notes (Addendum)
SLEEP MEDICINE CLINIC    Provider:  Larey Seat, MD  Primary Care Physician:  Shelda Pal, [address] [place] [address]     Referring Provider: Ileene Rubens [address] [place],  [place] [address]          Chief Complaint according to patient   Patient presents with:     New Patient (Initial Visit)     Pt alone, rm 10. Presented initially to discuss possibility of inspire. She had a SS 2012  with dr Halford Chessman and was started on CPAP. She quit using CPAP because she would take it off in middle of night. She is waking up tired .Sleep apnea may still be a concern but she doesn't want to return to PAP therapy. Daily naps.  We repeated a HST- and the patient had mild-moderate apnea overall, no hypoxemia, some REM accentuation. Deemed to be an inspire candidate , she went for implantation with ENT-Dr Redmond Baseman in November 2022.       HISTORY OF PRESENT ILLNESS:  Emily Parrish is a 76 y.o. year old Caucasian female patient seen here on 07/29/2021  In a RV after HST. 07-29-2021:  Moderate apnea, no hypoxemia, REM accentuated , but NREM AHI was still elevated- she was a possible candidate for INSPIRE. Was seen by Dr ENT : Redmond Baseman, MD . Implantation on 06-08-2021. Here for activation-  Tongue and facial movements were to be evaluated today.no dysarthria, no dysphonia. No dysphagia.  Scar is well healed.  Patient feels well.  We started with activation - starting at 0.1 V- and the patient first felt anything at  0.7  V.  " Waking the hypoglossus nerve "  we worked down from 0.7V. to 0.4 V when the patient still felt the inspire activation.  Movement of the tongue seen at 0.5V. A good regular waveform was achieved. -Wave form will be documented in the addendum.   Sleep latency can be 45 minutes- so the therapy delay will be set at 45 minutes up from 30. Pause function set for 15 minutes. Total therapy time set for 9  hours.  Therapy range Voltage 0.5 to 1.5 Volt- she will advance at home.  Every Friday increase by one level.   Instructed the patient on the remote function: light indicator- white is off, green is on. Battery exchange instruction.  User instruction at the chest level.    Last visit was 08-25-2020- and followed by a HST. OR with Dr. Redmond Baseman was 06-08-2021- Surgical notes were reviewed today . Copy provided to patient.      Dear Dr. [name].  I have the pleasure of seeing Emily Parrish on 08-25-2020, a right -handed White or Caucasian female with a histor of OSA, diagnosed in 2012 and last seen at [facility] in 2017 .  She has a past medical history of Anxiety, Esophageal reflux, Fatty liver, Goiter, Hiatal hernia, OSA (obstructive sleep apnea) (12/07/2010), Urethral stricture, Vitamin B 12 deficiency, and Vitamin D deficiency.   The patient had the first sleep study in 2012, none of her studies were available in Epic : she was a patient of Dr. Halford Chessman at the time. Sleep relevant medical history: liposuction abdominal , appendectomy , no other surgical history- none ENT wise.  Family medical /sleep history:  Elder brother with OSA, deviated septum.   Social history:  Retired from Designer, jewellery in [place] , and lives in a household  with 2 cats, no children.   Tobacco use: none .  ETOH use ; 1 glass of wine most evenings.  Caffeine intake in form of Coffee( /) Soda( / Tea ( 3-4 glasses -any time ) or energy drinks. Regular exercise in form of walking.    Sleep habits are as follows: The patient's dinner time is between 7  PM. The patient goes to bed at 11 PM and continues to sleep from midnight on for 7-8 hours, wakes for one bathroom break. The preferred sleep position is on either side, with the support of 1 pillow.  Dreams are reportedly frequent/vivid. 8 AM is the usual rise time. The patient wakes up spontaneously.  She reports not feeling refreshed or restored in AM, with symptoms such as dry  mouth , rarely morning headaches, and residual fatigue.  Naps are taken frequently, lasting from 30 to 60 minutes and are more refreshing than nocturnal sleep.   She has not been able to use CPAP over 4 hours, she just looses it- and she tried a dental device without success.    Review of Systems: reviewed o 07-29-2021-  inspire was not yet activated.  Out of a complete 14 system review, the patient complains of only the following symptoms, and all other reviewed systems are negative.:  Fatigue, sleepiness , may be snoring,  Insomnia - trouble to initiate sleep.    How likely are you to doze in the following situations: 0 = not likely, 1 = slight chance, 2 = moderate chance, 3 = high chance   Sitting and Reading? Watching Television? Sitting inactive in a public place (theater or meeting)? As a passenger in a car for an hour without a break? Lying down in the afternoon when circumstances permit? Sitting and talking to someone? Sitting quietly after lunch without alcohol? In a car, while stopped for a few minutes in traffic?   Total = 9/ 24 points   FSS endorsed at 30/ 63 points.  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, clinical  -- independent  Tobacco Use   Smoking status: Former    Years: 10.00    Types: Cigarettes    Quit date: 07/11/1971    Years since quitting: 50.0   Smokeless tobacco: Never   Tobacco comments:    used to smoke--1 1/2 pack per week  Vaping Use   Vaping Use: Never used  Substance and Sexual Activity   Alcohol use: Yes    Alcohol/week: 5.0 - 6.0 standard drinks    Types: 5 - 6 Glasses of wine per week    Comment: social   Drug use: No   Sexual activity: Not Currently    Partners: Male    Birth control/protection:  Post-menopausal  Other Topics Concern   Not on file  Social History Narrative   Lives by herself , no family in town   emergency contact Graceann Congress [phone])   Drinks about 1-2 caffeine drinks a day    Social Determinants of Health   Financial Resource Strain: Not on file  Food Insecurity: Not on file  Transportation Needs: Not on file  Physical Activity: Not on file  Stress: Not on file  Social Connections: Not on file  Family History  Problem Relation Age of Onset   Coronary artery disease Maternal Grandfather    Breast cancer Maternal Aunt        died at age 2   Scleroderma Mother    Emphysema Father    Alcohol abuse Father    Stroke Brother    Colon cancer Neg Hx    Diabetes Neg Hx     Past Medical History:  Diagnosis Date   Anxiety    Esophageal reflux    Fatty liver     NASH--s/p liver Bx 2011, early portal fibrosis    Goiter    Hiatal hernia    OSA (obstructive sleep apnea) 12/07/2010    does not use CPAP   Urethral stricture    uretheral stricture , s/p dilatation per urology ~ 2005   Vitamin B 12 deficiency    mild    Vitamin D deficiency     Past Surgical History:  Procedure Laterality Date   APPENDECTOMY     BIOPSY THYROID  2016   Consistent w/ benign follicular nodule   DRUG INDUCED ENDOSCOPY N/A 12/15/2020   Procedure: DRUG INDUCED ENDOSCOPY;  Surgeon: Melida Quitter, MD;  Location: [facility];  Service: ENT;  Laterality: N/A;   ESOPHAGOGASTRODUODENOSCOPY  2005   [place] STIMULATOR Right 06/08/2021   Procedure: IMPLANTATION OF HYPOGLOSSAL NERVE STIMULATOR;  Surgeon: Melida Quitter, MD;  Location: [facility];  Service: ENT;  Laterality: Right;     Current Outpatient Medications on File Prior to Visit  Medication Sig Dispense Refill   caffeine 200 MG TABS tablet Take 200 mg by mouth daily.     Calcium-Magnesium-Vitamin D 300-20-200 MG-MG-UNIT CHEW Chew 1 capsule by mouth daily.     citalopram (CELEXA)  20 MG tablet TAKE 1 TABLET DAILY 90 tablet 3   Coenzyme Q10 (COQ-10 PO) Take 1 tablet by mouth daily.     Docosahexaenoic Acid (DHA OMEGA 3 PO) Take 2 capsules by mouth 2 (two) times daily.     EPINEPHrine (EPIPEN 2-PAK) 0.3 mg/0.3 mL IJ SOAJ injection Inject 0.3 mLs (0.3 mg total) into the muscle as needed for anaphylaxis. 1 each 1   fenofibrate (TRICOR) 48 MG tablet TAKE 1 TABLET DAILY 90 tablet 0   HYDROcodone-acetaminophen (NORCO/VICODIN) 5-325 MG tablet Take 1-2 tablets by mouth every 6 (six) hours as needed for moderate pain. 12 tablet 0   ibuprofen (ADVIL) 200 MG tablet Take 400 mg by mouth every 6 (six) hours as needed for headache or moderate pain.     LUTEIN PO Take 1 capsule by mouth 2 (two) times daily.     Melatonin 10 MG TABS Take 10 mg by mouth at bedtime.     Milk Thistle 1000 MG CAPS Take 1,000 mg by mouth daily.     Multiple Vitamins-Minerals (MULTIVITAMIN PO) Take 1 tablet by mouth daily.     RABEprazole (ACIPHEX) 20 MG tablet TAKE 1 TABLET DAILY; 90 tablet 3   rosuvastatin (CRESTOR) 5 MG tablet Take 1 tablet (5 mg total) by mouth daily. 90 tablet 3   TURMERIC PO Take 1 tablet by mouth daily.     No current facility-administered medications on file prior to visit.    Allergies  Allergen Reactions   Fluticasone-Salmeterol     rapid heart rate, medication is inhalent    Physical exam:  Today's Vitals   07/29/21 1055  BP: 116/78  Pulse: 71   There is no height or  weight on file to calculate BMI.   Wt Readings from Last 3 Encounters:  07/18/21 187 lb (84.8 kg)  07/07/21 185 lb 9.6 oz (84.2 kg)  06/08/21 189 lb 6 oz (85.9 kg)     Ht Readings from Last 3 Encounters:  07/18/21 5' 11"  (1.803 m)  07/07/21 5' 11"  (1.803 m)  06/08/21 5' 11.5" (1.816 m)     No neurological l exam was performed on 07-29-2021- General: The patient is awake, alert and appears not in acute distress. The patient is well groomed. Head: Normocephalic, atraumatic. Neck is supple.  Mallampati 1,  neck circumference: 14.5  inches .  Nasal airflow  patent.  Retrognathia is not seen.  Dental status: top bridge.    Neurologic exam : The patient is awake and alert, oriented to place and time.   Memory subjective described as intact.  Attention span & concentration ability appears normal.  Speech is fluent,  without  dysarthria, dysphonia or aphasia.  Mood and affect are appropriate.   Cranial nerves: no loss of smell or taste reported  Pupils are equal and briskly reactive to light. Funduscopic exam deferred..  Extraocular movements in vertical and horizontal planes were intact and without nystagmus. No Diplopia. Visual fields by finger perimetry are intact. Hearing was intact to soft voice and finger rubbing.    Facial sensation intact to fine touch.  Facial motor strength is symmetric and tongue and uvula move midline.  Tonge protrusion in midline, tongue in cheek strength equal on both sides.  Neck ROM : rotation, tilt and flexion extension were normal for age and shoulder shrug was symmetrical.       12 minutes ago (11:25 AM)   Zenia Resides, Meagan L 12 minutes ago (11:25 AM)                         Note    After spending a total time of  25 minutes face to face and additional time for physical and neurologic examination, review of laboratory studies,  personal review of imaging studies, reports and results of other testing and review of referral information / records as far as provided in visit, I have established the following assessments:  I would like to thank Shelda Pal, [address],  [place] [address] for allowing me to meet with and to take care of this pleasant patient.   In short, TOPACIO CELLA is presenting post implantation of INSPIRE device and for activation. in the treatment of OSA.    New attended sleep study in 3 months. Inspire Titration for documented resolution of AHI.  RV in 10 weeks with  Dr. Brett Fairy, MD   CC: I will share my notes with.  Electronically signed by: Larey Seat, MD 07/29/2021 10:58 AM  [facility] Board certified by The [employer] of Sleep Medicine and Diplomate of the [employer] of Sleep Medicine. Board certified In Neurology through the [place], Fellow of the [employer] of Neurology. Medical Director of [facility].

## 2021-07-29 NOTE — Telephone Encounter (Deleted)
Inspire wave forms.

## 2021-08-23 ENCOUNTER — Other Ambulatory Visit: Payer: Self-pay | Admitting: Family Medicine

## 2021-09-07 ENCOUNTER — Telehealth: Payer: Self-pay

## 2021-09-07 NOTE — Telephone Encounter (Signed)
Check in call with Inspire patient. On level 4. Doing well. Using every night at least 6hrs. No uncomfortable feeling in neck or tongue. Confirmed appt in May with patient. Also made pt aware to call sooner if any questions or concerns arrive.  ?

## 2021-10-22 ENCOUNTER — Other Ambulatory Visit: Payer: Self-pay | Admitting: Family Medicine

## 2021-11-16 ENCOUNTER — Telehealth: Payer: Self-pay | Admitting: Neurology

## 2021-11-16 NOTE — Telephone Encounter (Signed)
Spoke w/ Emily Parrish in sleep lab. Pt should keep appt tomorrow, has to be in person since this is her f/u for inspire. Needs visit prior to SS. ?I called pt. She is currently sick. Coughing/sneezing.Sx started about 4 days ago. Covid test this past Sunday negative. I spoke w/ Emily Parrish. We r/s her to 11/21/21 at 2:30pm with Emily Parrish. She will call first thing that morning to r/s if she is still feeling sick. Will not charge cx fee. She verbalized understanding and appreciation. ?

## 2021-11-16 NOTE — Telephone Encounter (Signed)
Pt is asking if the appointment set for tomorrow can be a my chart vv or if it has to be in office.  When pt was told of next available being in July she stated she was told she has to have this appointment before her sleep study, please call. ?

## 2021-11-17 ENCOUNTER — Ambulatory Visit: Payer: Medicare HMO | Admitting: Neurology

## 2021-11-17 ENCOUNTER — Encounter: Payer: Self-pay | Admitting: Family Medicine

## 2021-11-17 ENCOUNTER — Ambulatory Visit (INDEPENDENT_AMBULATORY_CARE_PROVIDER_SITE_OTHER): Payer: Medicare HMO | Admitting: Family Medicine

## 2021-11-17 VITALS — BP 108/66 | HR 83 | Temp 97.8°F | Ht 71.0 in | Wt 186.4 lb

## 2021-11-17 DIAGNOSIS — J069 Acute upper respiratory infection, unspecified: Secondary | ICD-10-CM | POA: Diagnosis not present

## 2021-11-17 DIAGNOSIS — R0602 Shortness of breath: Secondary | ICD-10-CM | POA: Diagnosis not present

## 2021-11-17 MED ORDER — BENZONATATE 200 MG PO CAPS: 200.0000 mg | ORAL_CAPSULE | Freq: Two times a day (BID) | ORAL | 0 refills | Status: DC | PRN

## 2021-11-17 MED ORDER — GUAIFENESIN ER 600 MG PO TB12: 1200.0000 mg | ORAL_TABLET | Freq: Two times a day (BID) | ORAL | 2 refills | Status: DC

## 2021-11-17 MED ORDER — AMOXICILLIN-POT CLAVULANATE 875-125 MG PO TABS: 1.0000 | ORAL_TABLET | Freq: Two times a day (BID) | ORAL | 0 refills | Status: AC

## 2021-11-17 NOTE — Progress Notes (Signed)
? ?Acute Office Visit ? ?Subjective:  ? ?  ?Patient ID: MAELEE HOOT, female    DOB: [birth year], 76 y.o.   MRN: [id number] ? ?CC: cough, dyspnea on exertion, fatigue ? ? ?HPI ?Patient is in today for cough, dyspnea on exertion, fatigue ? ?Patient just recently got back from a 2-week trip out [place] with friends visiting the national parks and site-seeing. Reports that a little over a week ago, she started feeling poorly with dry cough and fatigue which she initially thought may have been related to the altitude change. States the cough was initially dry, but has recently become more productive with green sputum. She has had some mild rhinorrhea as well. She has been very short of breath with any activity - reports she was panting by the time she walked from the parking lot to our elevator. She is not having any pain with inspiration, calf pain/swelling, blood in sputum, fevers, headaches, nasal congestion, wheezing, dyspnea at rest, ear pain, rashes, chills, body aches.  ? ? ?No other sick contacts; negative COVID test at home already  ? ? ?ROS ?All review of systems negative except what is listed in the HPI ? ? ?   ?Objective:  ?  ?BP 108/66   Pulse 83   Temp 97.8 ?F (36.6 ?C)   Ht 5' 11"  (1.803 m)   Wt 186 lb 6.4 oz (84.6 kg)   LMP 07/10/1996   SpO2 99%   BMI 26.00 kg/m?  ? ? ?Physical Exam ?Vitals reviewed.  ?Constitutional:   ?   Appearance: Normal appearance.  ?Eyes:  ?   Comments: Right eye with mild conjunctivitis and clear drainage   ?Cardiovascular:  ?   Rate and Rhythm: Normal rate and regular rhythm.  ?Pulmonary:  ?   Effort: Pulmonary effort is normal.  ?   Breath sounds: Normal breath sounds. No wheezing, rhonchi or rales.  ?Skin: ?   General: Skin is warm and dry.  ?Neurological:  ?   General: No focal deficit present.  ?   Mental Status: She is alert and oriented to person, place, and time. Mental status is at baseline.  ?Psychiatric:     ?   Mood and Affect: Mood normal.     ?   Behavior:  Behavior normal.     ?   Thought Content: Thought content normal.     ?   Judgment: Judgment normal.  ? ? ? ? ? ?No results found for any visits on 11/17/21. ? ? ?   ?Assessment & Plan:  ? ?1. Upper respiratory tract infection, unspecified type ?2. Shortness of breath ?Start antibiotics, cough medicine as needed, and mucinex. Given your significant shortness of breath and recent travel, we can check a d-dimer to rule out blood clot. We will let you know results.  ?Continue supportive measures including rest, hydration, humidifier use, steam showers, warm compresses to sinuses, warm liquids with lemon and honey, and over-the-counter cough, cold, and analgesics as needed.   ?If not improving over the next several days, please follow-up and we can consider CXR and change in medication. ?Discussed possibly using prednisone and albuterol inhaler, but she was hesitant as she has not used these before and is not sure what she was allergic to in the fluticasone-salmeterol inhaler.  ?Patient aware of signs/symptoms requiring further/urgent evaluation.  ? ? ?- amoxicillin-clavulanate (AUGMENTIN) 875-125 MG tablet; Take 1 tablet by mouth 2 (two) times daily for 10 days.  Dispense: 20 tablet; Refill: 0 ?-  guaiFENesin (MUCINEX) 600 MG 12 hr tablet; Take 2 tablets (1,200 mg total) by mouth 2 (two) times daily.  Dispense: 30 tablet; Refill: 2 ?- benzonatate (TESSALON) 200 MG capsule; Take 1 capsule (200 mg total) by mouth 2 (two) times daily as needed for cough.  Dispense: 20 capsule; Refill: 0 ?- D-Dimer, Quantitative ?D ? ? ?Return if symptoms worsen or fail to improve. ? ?Terrilyn Saver, NP ? ? ?

## 2021-11-17 NOTE — Progress Notes (Signed)
Cough started 1 week ago ?

## 2021-11-17 NOTE — Patient Instructions (Addendum)
Start antibiotics, cough medicine as needed, and mucinex. Given your significant shortness of breath and recent travel, we can check a d-dimer to rule out blood clot. We will let you know results.  ?Continue supportive measures including rest, hydration, humidifier use, steam showers, warm compresses to sinuses, warm liquids with lemon and honey, and over-the-counter cough, cold, and analgesics as needed.   ?If not improving over the next several days, please follow-up and we can consider CXR and change in medication. ?

## 2021-11-18 LAB — D-DIMER, QUANTITATIVE: D-Dimer, Quant: 0.26 mcg/mL FEU (ref <0.50)

## 2021-11-21 ENCOUNTER — Telehealth: Payer: Self-pay

## 2021-11-21 ENCOUNTER — Ambulatory Visit: Payer: Medicare HMO | Admitting: Neurology

## 2021-11-21 ENCOUNTER — Encounter: Payer: Self-pay | Admitting: Neurology

## 2021-11-21 ENCOUNTER — Other Ambulatory Visit: Payer: Self-pay | Admitting: Family Medicine

## 2021-11-21 DIAGNOSIS — Z9682 Presence of neurostimulator: Secondary | ICD-10-CM | POA: Insufficient documentation

## 2021-11-21 DIAGNOSIS — G4733 Obstructive sleep apnea (adult) (pediatric): Secondary | ICD-10-CM

## 2021-11-21 DIAGNOSIS — G4719 Other hypersomnia: Secondary | ICD-10-CM

## 2021-11-21 NOTE — Progress Notes (Addendum)
? ? ?SLEEP MEDICINE CLINIC ?  ? ?Provider:  Larey Seat, MD  ?Primary Care Physician:  Shelda Pal, DO ?[address] ?[place] [address]  ? ?  ?Referring Provider: Shelda Pal, Do ?[address] ?[address] ?[place],  [place] [address]  ?  ?  ?    ?Chief Complaint according to patient   ?Patient presents with:  ?  ? New Patient (Initial Visit)  ?   Pt alone, rm 10. Presented initially to discuss possibility of inspire. She had a SS 2012  with dr Halford Chessman and was started on CPAP. She quit using CPAP because she would take it off in middle of night. She is waking up tired .Sleep apnea may still be a concern but she doesn't want to return to PAP therapy. Daily naps.  ?We repeated a HST- and the patient had mild-moderate apnea overall, no hypoxemia, some REM accentuation. Deemed to be an inspire candidate , she went for implantation with ENT-Dr Redmond Baseman in November 2022.   ?  ?  ?HISTORY OF PRESENT ILLNESS:  ?Emily Parrish is a 76 y.o. year old Caucasian female patient seen here on 11/21/2021  ?In a RV after after activation. She has contracted an upper respiratory infection, and has been on ATB, her voice is hoarse an she is coughing and clearing her throat. Not febrile. Reportedly negative for Covid.  ?Her range of inspire Voltage was not changed but 3 settings adjusted to comfort, settings for 10 minute pause, start delay to 60 minutes and total therapy time was increased to 10 hours. Capture morning REM sleep.  ? ? ? ?HST 07-29-2021: ?Moderate apnea, no hypoxemia, REM accentuated , but NREM AHI was still elevated- she was a possible candidate for INSPIRE. Was seen by Dr ENT : Redmond Baseman, MD . ?Implantation on 06-08-2021. ?Here for activation-  ?Tongue and facial movements were to be evaluated today.no dysarthria, no dysphonia. No dysphagia.  ?Scar is well healed.  Patient feels well. ? ?We started with activation - starting at 0.1 V- and the patient first felt anything at   0.7  V.  ?" Waking the hypoglossus nerve "  we worked down from 0.7V. to 0.4 V when the patient still felt the inspire activation.  ?Movement of the tongue seen at 0.5V. ?A good regular waveform was achieved. -Wave form will be documented in the addendum.  ? ?Sleep latency can be 45 minutes- so the therapy delay will be set at 45 minutes up from 30. ?Pause function set for 15 minutes. ?Total therapy time set for 9 hours.  ?Therapy range Voltage 0.5 to 1.5 Volt- she will advance at home.  Every Friday increase by one level.  ? ?Instructed the patient on the remote function: light indicator- white is off, green is on. ?Battery exchange instruction.  ?User instruction at the chest level.  ? ? ?Last visit was 08-25-2020- and followed by a HST. OR with Dr. Redmond Baseman was 06-08-2021- ?Surgical notes were reviewed today . Copy provided to patient.  ? ? ? ? ?Dear Dr. [name].  ?I have the pleasure of seeing Emily Parrish on 08-25-2020, a right -handed White or Caucasian female with a histor of OSA, diagnosed in 2012 and last seen at [facility] in 2017 .  ?She has a past medical history of Anxiety, Esophageal reflux, Fatty liver, Goiter, Hiatal hernia, OSA (obstructive sleep apnea) (12/07/2010), Urethral stricture, Vitamin B 12 deficiency, and Vitamin D deficiency. ?  ?  The patient had the first sleep study in 2012, none of her studies were available in Epic : she was a patient of Dr. Halford Chessman at the time. Sleep relevant medical history: liposuction abdominal , appendectomy , no other surgical history- none ENT wise.  ?Family medical /sleep history:  Elder brother with OSA, deviated septum.   ?Social history:  Retired from career in [employer] , and lives in a household with 2 cats, no children.  ? ?Tobacco use: none .  ETOH use ; 1 glass of wine most evenings.  ?Caffeine intake in form of Coffee( /) Soda( / Tea ( 3-4 glasses -any time ) or energy drinks. ?Regular exercise in form of walking.    ?Sleep habits are as follows: The patient's  dinner time is between 7  PM. The patient goes to bed at 11 PM and continues to sleep from midnight on for 7-8 hours, wakes for one bathroom break. ?The preferred sleep position is on either side, with the support of 1 pillow.  ?Dreams are reportedly frequent/vivid. 8 AM is the usual rise time. The patient wakes up spontaneously.  ?She reports not feeling refreshed or restored in AM, with symptoms such as dry mouth , rarely morning headaches, and residual fatigue.  ?Naps are taken frequently, lasting from 30 to 60 minutes and are more refreshing than nocturnal sleep.  ? ?She has not been able to use CPAP over 4 hours, she just looses it- and she tried a dental device without success.  ?  ?Review of Systems: reviewed o 07-29-2021-  inspire was not yet activated.  ?Out of a complete 14 system review, the patient complains of only the following symptoms, and all other reviewed systems are negative.:  ?Fatigue, sleepiness , may be snoring,  Insomnia - trouble to initiate sleep.  ?  ?How likely are you to doze in the following situations: ?0 = not likely, 1 = slight chance, 2 = moderate chance, 3 = high chance ?  ?Sitting and Reading? ?Watching Television? ?Sitting inactive in a public place (theater or meeting)? ?As a passenger in a car for an hour without a break? ?Lying down in the afternoon when circumstances permit? ?Sitting and talking to someone? ?Sitting quietly after lunch without alcohol? ?In a car, while stopped for a few minutes in traffic? ?  ?Total = 9/ 24 points  ? FSS endorsed at 30/ 63 points.  ?GDS 2/ 15  ? ?Social History  ? ?Socioeconomic History  ? Marital status: Single  ?  Spouse name: Not on file  ? Number of children: 0  ? Years of education: college  ? Highest education level: Not on file  ?Occupational History  ? 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 Smoking status: Former  ?  Years: 10.00  ?  Types: Cigarettes  ?  Quit date: 07/11/1971  ?   Years since quitting: 50.4  ? Smokeless tobacco: Never  ? Tobacco comments:  ?  used to smoke--1 1/2 pack per week  ?Vaping Use  ? Vaping Use: Never used  ?Substance and Sexual Activity  ? Alcohol use: Yes  ?  Alcohol/week: 5.0 - 6.0 standard drinks  ?  Types: 5 - 6 Glasses of wine per week  ?  Comment: social  ? Drug use: No  ? Sexual activity: Not Currently  ?  Partners: Male  ?  Birth control/protection: Post-menopausal  ?Other Topics Concern  ? Not on file  ?Social  History Narrative  ? Lives by herself , no family in town  ? emergency contact Graceann Congress [phone])  ? Drinks about 1-2 caffeine drinks a day   ? ?Social Determinants of Health  ? ?Financial Resource Strain: Not on file  ?Food Insecurity: Not on file  ?Transportation Needs: Not on file  ?Physical Activity: Not on file  ?Stress: Not on file  ?Social Connections: Not on file  ? ? ?Family History  ?Problem Relation Age of Onset  ? Coronary artery disease Maternal Grandfather   ? Breast cancer Maternal Aunt   ?     died at age 24  ? Scleroderma Mother   ? Emphysema Father   ? Alcohol abuse Father   ? Stroke Brother   ? Colon cancer Neg Hx   ? Diabetes Neg Hx   ? ? ?Past Medical History:  ?Diagnosis Date  ? Anxiety   ? Esophageal reflux   ? Fatty liver   ?  NASH--s/p liver Bx 2011, early portal fibrosis   ? Goiter   ? Hiatal hernia   ? OSA (obstructive sleep apnea) 12/07/2010  ?  does not use CPAP  ? Urethral stricture   ? uretheral stricture , s/p dilatation per urology ~ 2005  ? Vitamin B 12 deficiency   ? mild   ? Vitamin D deficiency   ? ? ?Past Surgical History:  ?Procedure Laterality Date  ? APPENDECTOMY    ? BIOPSY THYROID  2016  ? Consistent w/ benign follicular nodule  ? DRUG INDUCED ENDOSCOPY N/A 12/15/2020  ? Procedure: DRUG INDUCED ENDOSCOPY;  Surgeon: Melida Quitter, MD;  Location: [facility] OR;  Service: ENT;  Laterality: N/A;  ? ESOPHAGOGASTRODUODENOSCOPY  2005  ? HH  ? IMPLANTATION OF HYPOGLOSSAL NERVE STIMULATOR Right 06/08/2021  ? Procedure:  IMPLANTATION OF HYPOGLOSSAL NERVE STIMULATOR;  Surgeon: Melida Quitter, MD;  Location: [facility];  Service: ENT;  Laterality: Right;  ?  ? ?Current Outpatient Medications on File Prior to Visit  ?

## 2021-11-30 ENCOUNTER — Ambulatory Visit: Payer: Medicare HMO | Admitting: Neurology

## 2021-11-30 DIAGNOSIS — G4733 Obstructive sleep apnea (adult) (pediatric): Secondary | ICD-10-CM

## 2021-11-30 DIAGNOSIS — Z9682 Presence of neurostimulator: Secondary | ICD-10-CM

## 2021-11-30 DIAGNOSIS — Z789 Other specified health status: Secondary | ICD-10-CM

## 2021-12-02 DIAGNOSIS — Z1231 Encounter for screening mammogram for malignant neoplasm of breast: Secondary | ICD-10-CM | POA: Diagnosis not present

## 2021-12-02 LAB — HM MAMMOGRAPHY

## 2021-12-06 ENCOUNTER — Encounter: Payer: Self-pay | Admitting: Family Medicine

## 2021-12-15 ENCOUNTER — Ambulatory Visit: Payer: Medicare HMO | Admitting: Neurology

## 2021-12-22 ENCOUNTER — Ambulatory Visit: Payer: Medicare HMO | Admitting: Neurology

## 2021-12-22 ENCOUNTER — Encounter: Payer: Self-pay | Admitting: Neurology

## 2021-12-22 DIAGNOSIS — Z789 Other specified health status: Secondary | ICD-10-CM

## 2021-12-22 DIAGNOSIS — Z9682 Presence of neurostimulator: Secondary | ICD-10-CM

## 2021-12-22 NOTE — Procedures (Signed)
PATIENT'S NAME:  Emily Parrish, Emily Parrish DOB:      [birth year]      MR#:    [id number]     DATE OF RECORDING: 11/30/2021 REFERRING M.D.:  Rockne Coons, DO Study Performed:  Polysomnogram with Inspire Titration HISTORY:  Emily, Parrish, date of birth Oct 07, 1945, presents for an Inspire overnight titration. The Pre-implant baseline AHI was 21.4/hr, based on a HST performed at [place] on 09/09/2020.   The patient's hypoglossal nerve stimulator (INSPIRE) was implanted at [facility] on 06/08/2021 by ENT Dr Redmond Baseman.  Referred by Rockne Coons, DO. CLINICAL INFORMATION/HISTORY 08/25/2020 : Emily Parrish is a 76 year-old Caucasian female patient who has been seen upon referral from Dr. [name].  Emily Parrish has a medical history  of OSA, diagnosed in 2012 and last seen at [facility] in 2017 .  She has amedical history of Anxiety, Esophageal reflux, Fatty liver, Goiter, Hiatal hernia, OSA (obstructive sleep apnea) (12/07/2010), Urethral stricture, abdominal liposuction and appendectomy, Vitamin B 12 deficiency, and Vitamin D deficiency. The patient had the first sleep study in 2012, none of her studies were available in Epic : She was a patient of Dr. Halford Chessman at the time. She has not been able to use CPAP longer than 4 hours a night, she just " loses it-: and she takes daily naps as well- she  (unsuccessfully) had tried a dental device. The last CPAP settings were 5-15 cm water. HST by [facility] from 09-09-20 confirmed Moderate sleep apnea, AHI was 21.4/h REM AHI was 33.4/h.  supine accentuated apnea.    Epworth sleepiness score: 8/24. BMI: 26.3 kg/m Neck Circumference: 14.5"   The patient's device was activated at [place] on 07/29/2021, and voltage was set at 0.7V. The patient was comfortable while sufficient tongue movement was observed. Therapy duration was 10 hours.  The Pre-implant baseline AHI was 21.4 /hour.  BMI was 26.3 kg/m2. The patient arrived at the sleep lab with an incoming amplitude of  1.1 V.  The patient's usage was 70 hours per week.  The patient reports fair subjective benefit.    CURRENT MEDICATIONS: Augmentin, Tessalon, caffeine tabs, Calcium-magnesium-vitamin D, Celexa, Co-Q10, Omega 3, EpiPen, Mucinex, Advil, Lutein, Melatonin, milk thistle, multivitamins, Aciphex, Crestor, Turmeric   PROCEDURE:  This is a multichannel digital polysomnogram utilizing the Somnostar 11.2 system.  Electrodes and sensors were applied and monitored per AASM Specifications.   EEG, EOG, Chin and Limb EMG, were sampled at 200 Hz.  ECG, Snore and Nasal Pressure, Thermal Airflow, Respiratory Effort, CPAP Flow and Pressure, Oximetry was sampled at 50 Hz. Digital video and audio were recorded.      INSPIRE STUDY:  Emily Parrish was turned on after the patient fell asleep with a starting amplitude at 0.9 V., 0.2 Volts below the Incoming amplitude of: 1.1 Volts.   Inspire was titrated to a maximum amplitude of 0.9 Volts. Therapeutic Amplitude was found at 0.9 volts.    The patient's AHI at therapeutic amplitude was estimated to be 0.9V achieving an AHI of 3.2 /hour.  After this sleep study was completed, the patient was programmed back to the Incoming Amplitude of 1.1 V.  In addition, the lower limit was programmed to 0.2V below the incoming amplitude, and the upper limit was programmed to 1.0 V above the lower limit.    Lights Out was at 22:04 and Lights On at 04:53.  Total recording time (TRT) was 409.5 minutes, with a total sleep time (TST) of 247.5 minutes.   The  patient's sleep latency was 47  minutes.  REM latency was 0 minutes.  The sleep efficiency was 60.4 %.   SLEEP ARCHITECTURE: WASO (Wake after sleep onset) was 91 minutes.  There were 33.5 minutes in Stage N1, 214 minutes Stage N2, 0 minutes Stage N3 and 0 minutes in Stage REM.  The percentage of Stage N1 was 13.5%, Stage N2 was 86.5%, Stage N3 was 0% and Stage R (REM sleep) was 0%.     RESPIRATORY ANALYSIS:  There were a total of 2 respiratory  events:  0 obstructive apneas, 0 central apneas and 0 mixed apneas with a total of 4 apneas and an apnea index (AI) of 1. /hour. There were 6 hypopneas with a hypopnea index of 1.5 /hour.     The total APNEA/HYPOPNEA INDEX (AHI) was 2.4 /hour and the total RESPIRATORY DISTURBANCE INDEX was 2.4/hour.  The REM AHI was 0 /hour, versus a non-REM AHI of 1.. The patient spent 94 minutes of total sleep time in the supine. The supine AHI was 1.9 versus a non-supine AHI of 2.7.  OXYGEN SATURATION & C02:  The baseline 02 saturation was 96%, with the lowest being 90%. Time spent below 89% saturation equaled 0 minutes.   PERIODIC LIMB MOVEMENTS:    The patient had a total of 301 Periodic Limb Movements. The Periodic Limb Movement (PLM) Arousal index was 7.3 /hour.  IMPRESSION:  HST confirmed Obstructive Sleep Apnea (OSA) was controlled under an inspire setting of 0.9V with supine sleep being recorded. No hypoxia was noted.  Periodic Limb Movement Disorder (PLMD) persisted.  RECOMMENDATIONS: Advance to 0.9 V setting of Inspire.   I certify that I have reviewed the entire raw data recording prior to the issuance of this report in accordance with the Standards of Accreditation of the American Academy of Sleep Medicine (AASM)  Larey Seat, MD [place], [facility]  Diplomat, American Board of Sleep Medicine

## 2021-12-22 NOTE — Progress Notes (Addendum)
SLEEP MEDICINE CLINIC    Provider:  Larey Seat, MD  Primary Care Physician:  Emily Parrish, [address] [place] [address]     Referring Provider: Ileene Rubens [address] [place],  [place] [address]          Chief Complaint according to patient   Patient presents with:     New Patient (Initial Visit)     Pt alone, rm 10. Presented initially to discuss possibility of inspire. She had a SS 2012  with dr Halford Chessman and was started on CPAP. She quit using CPAP because she would take it off in middle of night. She is waking up tired .Sleep apnea may still be a concern but she doesn't want to return to PAP therapy. Daily naps.  We repeated a HST- and the patient had mild-moderate apnea overall, no hypoxemia, some REM accentuation. Deemed to be an inspire candidate , she went for implantation with ENT-Dr Redmond Baseman in November 2022.       HISTORY OF PRESENT ILLNESS:  Emily Parrish is a 76 y.o. year old Caucasian female patient seen here on 12/22/2021 , RV after titration study for INSPIRE. 11-30-2021.                   Note     IMPRESSION: incoming V setting was 1.1V  and had to be reduced after this study.   HST confirmed Obstructive Sleep Apnea (OSA) was controlled under an inspire setting of 0.9V with supine sleep being recorded. No hypoxia was noted.  Periodic Limb Movement Disorder (PLMD) persisted.   RECOMMENDATIONS:  Reduce to 0.9 V setting of Inspire, start a pause delay of 15 minutes. Larey Seat, MD     In a RV after after activation. She has contracted an upper respiratory infection, and has been on ATB, her voice is hoarse an she is coughing and clearing her throat. Not febrile. Reportedly negative for Covid.  Her range of inspire Voltage was not changed but 3 settings adjusted to comfort, settings for 10 minute pause, start delay to 60 minutes and total therapy time was increased to 10 hours.  Capture morning REM sleep.     HST 07-29-2021: Moderate apnea, no hypoxemia, REM accentuated , but NREM AHI was still elevated- she was a possible candidate for INSPIRE. Was seen by Dr ENT : Redmond Baseman, MD . Implantation on 06-08-2021. Here for activation-  Tongue and facial movements were to be evaluated today.no dysarthria, no dysphonia. No dysphagia.  Scar is well healed.  Patient feels well.  We started with activation - starting at 0.1 V- and the patient first felt anything at  0.7  V.  " Waking the hypoglossus nerve "  we worked down from 0.7V. to 0.4 V when the patient still felt the inspire activation.  Movement of the tongue seen at 0.5V. A good regular waveform was achieved. -Wave form will be documented in the addendum.   Sleep latency can be 45 minutes- so the therapy delay will be set at 45 minutes up from 30. Pause function set for 15 minutes. Total therapy time set for 9 hours.  Therapy range Voltage 0.5 to 1.5 Volt- she will advance at home.  Every Friday increase by one level.   Instructed the patient on the remote function: light indicator- white is off, green is on. Battery exchange instruction.  User instruction at the chest level.  Last visit was 08-25-2020- and followed by a HST. OR with Dr. Redmond Baseman was 06-08-2021- Surgical notes were reviewed today . Copy provided to patient.      Dear Dr. [name].  I have the pleasure of seeing KAYANA THOEN on 08-25-2020, a right -handed White or Caucasian female with a histor of OSA, diagnosed in 2012 and last seen at [facility] in 2017 .  She has a past medical history of Anxiety, Esophageal reflux, Fatty liver, Goiter, Hiatal hernia, OSA (obstructive sleep apnea) (12/07/2010), Urethral stricture, Vitamin B 12 deficiency, and Vitamin D deficiency.   The patient had the first sleep study in 2012, none of her studies were available in Epic : she was a patient of Dr. Halford Chessman at the time. Sleep relevant medical history: liposuction  abdominal , appendectomy , no other surgical history- none ENT wise.  Family medical /sleep history:  Elder brother with OSA, deviated septum.   Social history:  Retired from career in [employer] , and lives in a household with 2 cats, no children.   Tobacco use: none .  ETOH use ; 1 glass of wine most evenings.  Caffeine intake in form of Coffee( /) Soda( / Tea ( 3-4 glasses -any time ) or energy drinks. Regular exercise in form of walking.    Sleep habits are as follows: The patient's dinner time is between 7  PM. The patient goes to bed at 11 PM and continues to sleep from midnight on for 7-8 hours, wakes for one bathroom break. The preferred sleep position is on either side, with the support of 1 pillow.  Dreams are reportedly frequent/vivid. 8 AM is the usual rise time. The patient wakes up spontaneously.  She reports not feeling refreshed or restored in AM, with symptoms such as dry mouth , rarely morning headaches, and residual fatigue.  Naps are taken frequently, lasting from 30 to 60 minutes and are more refreshing than nocturnal sleep.   She has not been able to use CPAP over 4 hours, she just looses it- and she tried a dental device without success.    Review of Systems: reviewed o 07-29-2021-  inspire was not yet activated.  Out of a complete 14 system review, the patient complains of only the following symptoms, and all other reviewed systems are negative.:  Fatigue, sleepiness , may be snoring,  Insomnia - trouble to initiate sleep.    How likely are you to doze in the following situations: 0 = not likely, 1 = slight chance, 2 = moderate chance, 3 = high chance   Sitting and Reading? Watching Television? Sitting inactive in a public place (theater or meeting)? As a passenger in a car for an hour without a break? Lying down in the afternoon when circumstances permit? Sitting and talking to someone? Sitting quietly after lunch without alcohol? In a car, while stopped for a few  minutes in traffic?   Total = 9/ 24 points   FSS endorsed at 30/ 63 points.  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, clinical  -- independent  Tobacco Use   Smoking status: Former    Years: 10.00    Types: Cigarettes    Quit date: 07/11/1971  Years since quitting: 50.4   Smokeless tobacco: Never   Tobacco comments:    used to smoke--1 1/2 pack per week  Vaping Use   Vaping Use: Never used  Substance and Sexual Activity   Alcohol use: Yes    Alcohol/week: 5.0 - 6.0 standard drinks of alcohol    Types: 5 - 6 Glasses of wine per week    Comment: social   Drug use: No   Sexual activity: Not Currently    Partners: Male    Birth control/protection: Post-menopausal  Other Topics Concern   Not on file  Social History Narrative   Lives by herself , no family in town   emergency contact Graceann Congress [phone])   Drinks about 1-2 caffeine drinks a day    Social Determinants of Health   Financial Resource Strain: Not on file  Food Insecurity: Not on file  Transportation Needs: Not on file  Physical Activity: Not on file  Stress: Not on file  Social Connections: Not on file    Family History  Problem Relation Age of Onset   Coronary artery disease Maternal Grandfather    Breast cancer Maternal Aunt        died at age 46   Scleroderma Mother    Emphysema Father    Alcohol abuse Father    Stroke Brother    Colon cancer Neg Hx    Diabetes Neg Hx     Past Medical History:  Diagnosis Date   Anxiety    Esophageal reflux    Fatty liver     NASH--s/p liver Bx 2011, early portal fibrosis    Goiter    Hiatal hernia    OSA (obstructive sleep apnea) 12/07/2010    does not use CPAP   Urethral stricture    uretheral stricture , s/p dilatation  per urology ~ 2005   Vitamin B 12 deficiency    mild    Vitamin D deficiency     Past Surgical History:  Procedure Laterality Date   APPENDECTOMY     BIOPSY THYROID  2016   Consistent w/ benign follicular nodule   DRUG INDUCED ENDOSCOPY N/A 12/15/2020   Procedure: DRUG INDUCED ENDOSCOPY;  Surgeon: Melida Quitter, MD;  Location: [facility];  Service: ENT;  Laterality: N/A;   ESOPHAGOGASTRODUODENOSCOPY  2005   [place] STIMULATOR Right 06/08/2021   Procedure: IMPLANTATION OF HYPOGLOSSAL NERVE STIMULATOR;  Surgeon: Melida Quitter, MD;  Location: [facility];  Service: ENT;  Laterality: Right;     Current Outpatient Medications on File Prior to Visit  Medication Sig Dispense Refill   benzonatate (TESSALON) 200 MG capsule Take 1 capsule (200 mg total) by mouth 2 (two) times daily as needed for cough. 20 capsule 0   caffeine 200 MG TABS tablet Take 200 mg by mouth daily.     Calcium-Magnesium-Vitamin D 300-20-200 MG-MG-UNIT CHEW Chew 1 capsule by mouth daily.     citalopram (CELEXA) 20 MG tablet TAKE 1 TABLET DAILY 90 tablet 3   Coenzyme Q10 (COQ-10 PO) Take 1 tablet by mouth daily.     Docosahexaenoic Acid (DHA OMEGA 3 PO) Take 2 capsules by mouth 2 (two) times daily.     EPINEPHrine (EPIPEN 2-PAK) 0.3 mg/0.3 mL IJ SOAJ injection Inject 0.3 mLs (0.3 mg total) into the muscle as needed for anaphylaxis. 1 each 1   fenofibrate (TRICOR) 48 MG tablet TAKE 1 TABLET DAILY 90 tablet 0   guaiFENesin (MUCINEX) 600  MG 12 hr tablet Take 2 tablets (1,200 mg total) by mouth 2 (two) times daily. 30 tablet 2   ibuprofen (ADVIL) 200 MG tablet Take 400 mg by mouth every 6 (six) hours as needed for headache or moderate pain.     LUTEIN PO Take 1 capsule by mouth 2 (two) times daily.     Melatonin 10 MG TABS Take 10 mg by mouth at bedtime.     Milk Thistle 1000 MG CAPS Take 1,000 mg by mouth daily.     Multiple Vitamins-Minerals (MULTIVITAMIN PO) Take 1 tablet by mouth  daily.     RABEprazole (ACIPHEX) 20 MG tablet TAKE 1 TABLET DAILY; 90 tablet 3   rosuvastatin (CRESTOR) 5 MG tablet TAKE 1 TABLET DAILY 90 tablet 3   TURMERIC PO Take 1 tablet by mouth daily.     No current facility-administered medications on file prior to visit.    Allergies  Allergen Reactions   Fluticasone-Salmeterol     rapid heart rate, medication is inhalent    Physical exam:  There were no vitals filed for this visit.  There is no height or weight on file to calculate BMI.   Wt Readings from Last 3 Encounters:  11/17/21 186 lb 6.4 oz (84.6 kg)  07/18/21 187 lb (84.8 kg)  07/07/21 185 lb 9.6 oz (84.2 kg)     Ht Readings from Last 3 Encounters:  11/17/21 5' 11"  (1.803 m)  07/18/21 5' 11"  (1.803 m)  07/07/21 5' 11"  (1.803 m)     No neurological exam was performed on 07-29-2021- General: The patient is awake, alert and appears not in acute distress. The patient is well groomed. Head: Normocephalic, atraumatic. Neck is supple. Mallampati 1,  neck circumference: 14.5  inches .  Nasal airflow patent.   Retrognathia is not seen.  Dental status: top bridge.    Neurologic exam : The patient is awake and alert, oriented to place and time.   Memory subjective described as intact.  Attention span & concentration ability appears normal.  Speech is fluent,  without  dysarthria, dysphonia or aphasia.  Mood and affect are appropriate.   Cranial nerves: no loss of smell or taste reported   Facial sensation intact to fine touch.  Facial motor strength is symmetric and tongue and uvula move midline.  Tonge protrusion in midline, tongue in cheek strength equal on both sides.  Neck ROM : rotation, tilt and flexion extension were normal for age and shoulder shrug was symmetrical.     1 hour ago (2:54 PM)                Note      12 minutes ago (11:25 AM)   Zenia Resides, Meagan L 12 minutes ago (11:25 AM)                         Note    1 hour ago (2:39 PM)                 I would like to thank Emily Pal, [address],  [place] [address] for allowing me to meet with and to take care of this pleasant patient.   In short, Emily Parrish is presenting post implantation of INSPIRE device and for interrogation of device efficacy in the treatment of OSA.  She had a surprisingly good response to [facility] during her in patient sleep study titration.  one setting  was changed today - in response to the good response at 0.9 V - her settings were reduced from 1.1 V. !  New attended sleep study on May 24 th-2023 for titration  Inspire Titration for documented resolution of AHI to 3.2/h without  REM sleep being observed.  PLMs were frequent.  There was a new setting of the pause- therapy delay to 15 minutes upon patients request. .  RV [id number] th with Dr. Brett Fairy, MD   Rv in 6 months for interrogation.   Electronically signed by: Larey Seat, MD 12/22/2021 2:03 PM  [facility] Board certified by The [employer] of Sleep Medicine and Diplomate of the [employer] of Sleep Medicine. Board certified In Neurology through the [employer], Fellow of the [employer] of Neurology. Medical Director of [facility].

## 2021-12-22 NOTE — Progress Notes (Signed)
1. HST confirmed Obstructive Sleep Apnea (OSA) was controlled under an inspire setting of 0.9V with supine sleep being recorded. No hypoxia was noted.  2. Periodic Limb Movement Disorder (PLMD) persisted.

## 2022-01-02 ENCOUNTER — Telehealth: Payer: Self-pay

## 2022-01-02 ENCOUNTER — Ambulatory Visit (INDEPENDENT_AMBULATORY_CARE_PROVIDER_SITE_OTHER): Payer: Medicare HMO | Admitting: Family Medicine

## 2022-01-02 ENCOUNTER — Encounter: Payer: Self-pay | Admitting: Family Medicine

## 2022-01-02 VITALS — BP 128/79 | HR 64 | Ht 71.0 in | Wt 190.6 lb

## 2022-01-02 DIAGNOSIS — R350 Frequency of micturition: Secondary | ICD-10-CM | POA: Diagnosis not present

## 2022-01-02 DIAGNOSIS — R300 Dysuria: Secondary | ICD-10-CM | POA: Diagnosis not present

## 2022-01-02 LAB — POC URINALSYSI DIPSTICK (AUTOMATED)
Bilirubin, UA: NEGATIVE
Blood, UA: NEGATIVE
Glucose, UA: NEGATIVE
Ketones, UA: NEGATIVE
Nitrite, UA: NEGATIVE
Protein, UA: NEGATIVE
Spec Grav, UA: 1.01 (ref 1.010–1.025)
Urobilinogen, UA: 0.2 E.U./dL
pH, UA: 7.5 (ref 5.0–8.0)

## 2022-01-02 MED ORDER — CEPHALEXIN 500 MG PO CAPS: 500.0000 mg | ORAL_CAPSULE | Freq: Two times a day (BID) | ORAL | 0 refills | Status: DC

## 2022-01-04 LAB — URINE CULTURE
MICRO NUMBER:: 13571588
SPECIMEN QUALITY:: ADEQUATE

## 2022-01-05 ENCOUNTER — Ambulatory Visit: Payer: Medicare HMO | Admitting: Neurology

## 2022-01-16 ENCOUNTER — Ambulatory Visit: Payer: Medicare HMO | Admitting: Family Medicine

## 2022-02-19 ENCOUNTER — Other Ambulatory Visit: Payer: Self-pay | Admitting: Family Medicine

## 2022-03-07 DIAGNOSIS — K219 Gastro-esophageal reflux disease without esophagitis: Secondary | ICD-10-CM | POA: Diagnosis not present

## 2022-03-07 DIAGNOSIS — E785 Hyperlipidemia, unspecified: Secondary | ICD-10-CM | POA: Diagnosis not present

## 2022-03-07 DIAGNOSIS — Z87892 Personal history of anaphylaxis: Secondary | ICD-10-CM | POA: Diagnosis not present

## 2022-03-07 DIAGNOSIS — Z87891 Personal history of nicotine dependence: Secondary | ICD-10-CM | POA: Diagnosis not present

## 2022-03-07 DIAGNOSIS — I951 Orthostatic hypotension: Secondary | ICD-10-CM | POA: Diagnosis not present

## 2022-03-07 DIAGNOSIS — Z8249 Family history of ischemic heart disease and other diseases of the circulatory system: Secondary | ICD-10-CM | POA: Diagnosis not present

## 2022-03-07 DIAGNOSIS — R69 Illness, unspecified: Secondary | ICD-10-CM | POA: Diagnosis not present

## 2022-04-07 ENCOUNTER — Encounter: Payer: Self-pay | Admitting: Family Medicine

## 2022-05-13 ENCOUNTER — Other Ambulatory Visit: Payer: Self-pay | Admitting: Family Medicine

## 2022-05-17 DIAGNOSIS — H04123 Dry eye syndrome of bilateral lacrimal glands: Secondary | ICD-10-CM | POA: Diagnosis not present

## 2022-05-17 DIAGNOSIS — H35372 Puckering of macula, left eye: Secondary | ICD-10-CM | POA: Diagnosis not present

## 2022-05-17 DIAGNOSIS — Z9889 Other specified postprocedural states: Secondary | ICD-10-CM | POA: Diagnosis not present

## 2022-05-17 DIAGNOSIS — H2513 Age-related nuclear cataract, bilateral: Secondary | ICD-10-CM | POA: Diagnosis not present

## 2022-05-30 ENCOUNTER — Ambulatory Visit: Payer: Medicare HMO | Admitting: Neurology

## 2022-05-31 ENCOUNTER — Ambulatory Visit (INDEPENDENT_AMBULATORY_CARE_PROVIDER_SITE_OTHER): Payer: Medicare HMO | Admitting: Family Medicine

## 2022-05-31 ENCOUNTER — Encounter: Payer: Self-pay | Admitting: Family Medicine

## 2022-05-31 VITALS — BP 112/80 | HR 98 | Temp 98.1°F | Resp 20 | Ht 71.0 in | Wt 187.4 lb

## 2022-05-31 DIAGNOSIS — J069 Acute upper respiratory infection, unspecified: Secondary | ICD-10-CM | POA: Diagnosis not present

## 2022-05-31 DIAGNOSIS — N3000 Acute cystitis without hematuria: Secondary | ICD-10-CM | POA: Diagnosis not present

## 2022-05-31 DIAGNOSIS — I951 Orthostatic hypotension: Secondary | ICD-10-CM | POA: Diagnosis not present

## 2022-05-31 LAB — POCT URINALYSIS DIPSTICK
Bilirubin, UA: NEGATIVE
Blood, UA: NEGATIVE
Glucose, UA: NEGATIVE
Ketones, UA: NEGATIVE
Nitrite, UA: POSITIVE
Protein, UA: NEGATIVE
Spec Grav, UA: 1.015 (ref 1.010–1.025)
Urobilinogen, UA: 0.2 E.U./dL
pH, UA: 5 (ref 5.0–8.0)

## 2022-05-31 MED ORDER — CEPHALEXIN 500 MG PO CAPS: 500.0000 mg | ORAL_CAPSULE | Freq: Three times a day (TID) | ORAL | 0 refills | Status: AC

## 2022-05-31 NOTE — Progress Notes (Signed)
Chief Complaint  Patient presents with   Dysuria    2 weeks   Cough    Congestion , onset: 4 days    Emily Parrish is a 75 y.o. female here for possible UTI.  Duration: 2 weeks. Symptoms: Dysuria, urinary retention, foul odor Denies: hematuria, urinary hesitancy, fever, nausea, vomiting, flank pain, vaginal discharge Hx of recurrent UTI? No Tried cranberry juice and Azo without relief.  Denies new sexual partners.  Cough Duration: 1 week  Associated symptoms: shortness of breath, chest tightness, and coughing Denies: sinus congestion, sinus pain, rhinorrhea, itchy watery eyes, ear pain, ear drainage, sore throat, wheezing, myalgia, and fevers Treatment to date: Coricidin cold meds Sick contacts: No Tested neg for covid x 2  Lightheadedness Has been happening for a couple of years and getting worse. Still feel lightheaded when she stands up. Lasts up to a couple minutes. Has not had LOC or fallen. Stays well hydrated. No palpitations, diarrhea, bleeding, fevers, N/V.   Past Medical History:  Diagnosis Date   Anxiety    Esophageal reflux    Fatty liver     NASH--s/p liver Bx 2011, early portal fibrosis    Goiter    Hiatal hernia    OSA (obstructive sleep apnea) 12/07/2010    does not use CPAP   Urethral stricture    uretheral stricture , s/p dilatation per urology ~ 2005   Vitamin B 12 deficiency    mild    Vitamin D deficiency      BP 112/80   Pulse 98   Temp 98.1 F (36.7 C)   Resp 20   Ht 5' 11"  (1.803 m)   Wt 187 lb 6.4 oz (85 kg)   LMP 07/10/1996   SpO2 91%   BMI 26.14 kg/m  General: Awake, alert, appears stated age Heart: RRR, no lower extremity edema, no bruits, no murmurs Neuro: DTRs equal and symmetric throughout, no clonus, no cerebellar signs, gait is normal Lungs: CTAB, normal respiratory effort, no accessory muscle usage Abd: BS+, soft, NT, ND, no masses or organomegaly MSK: No CVA tenderness, neg Lloyd's sign Psych: Age appropriate  judgment and insight  Acute cystitis without hematuria - Plan: POCT Urinalysis Dipstick, Urine Culture, cephALEXin (KEFLEX) 500 MG capsule  URI with cough and congestion  Orthostasis  UA is suggestive of infection with positive nitrates and leukocyte esterase.  7 days of Keflex.  Stay hydrated. Seek immediate care if pt starts to develop fevers, new/worsening symptoms, uncontrollable N/V. Likely viral though Keflex would help cover bacterial etiologies.  Continue over-the-counter cough suppressants as this is helpful.  Push fluids, cover mouth when coughing, practice good hand hygiene. She does stay well-hydrated.  Would recommend adding some electrolytes to her fluid intake.  Get up slowly/cautiously.  Consider wearing compression stockings.  Could consider midodrine if no improvement.  It does not appear she is having any insensible losses.  She will let me know if anything changes. Follow-up as originally scheduled in 6 weeks. The patient voiced understanding and agreement to the plan.  I spent 30 minutes with the patient discussing the above plans in addition to reviewing her chart on the same day of the visit.  [place], DO 05/31/22 7:52 AM

## 2022-05-31 NOTE — Patient Instructions (Addendum)
Stay hydrated.   Warning signs/symptoms: Uncontrollable nausea/vomiting, fevers, worsening symptoms despite treatment, confusion.  Give [place] around 2 business days to get culture back to you.  Continue to push fluids, practice good hand hygiene, and cover your mouth if you cough.  If you start having fevers, shaking or shortness of breath, seek immediate care.  Stay hydrated.  Consider adding electrolytes to your fluid intake.   Consider wearing compression stockings.  Please get up slowly and cautiously.   There is medicine that exists that can help with this if all else fails.   Let us know if you need anything.

## 2022-06-03 LAB — URINE CULTURE
MICRO NUMBER:: 14224540
SPECIMEN QUALITY:: ADEQUATE

## 2022-06-08 DIAGNOSIS — R233 Spontaneous ecchymoses: Secondary | ICD-10-CM | POA: Diagnosis not present

## 2022-06-21 ENCOUNTER — Ambulatory Visit: Payer: Medicare HMO | Admitting: Neurology

## 2022-06-25 IMAGING — CT CT HEAD W/O CM
3 of 4 series · 15 of 47 positions shown, 18 images · non-contrast
Comparison: None.

CLINICAL DATA: Head trauma with head pain.

EXAM:
CT HEAD WITHOUT CONTRAST
TECHNIQUE: Contiguous axial images were obtained from the base of the skull
through the vertex without intravenous contrast.

[Series 4: head 2.0 h70h · axial · 0.43mm/px · z∈[+1367,+1497]mm · 9 of 83 slices shown, 12 images]
[im 9/83  brain]
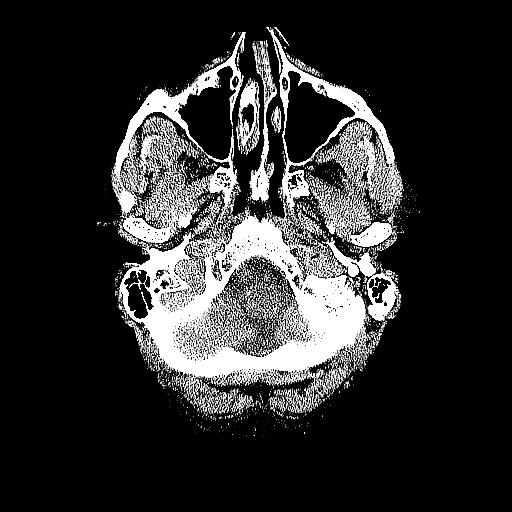
[im 9/83  bone]
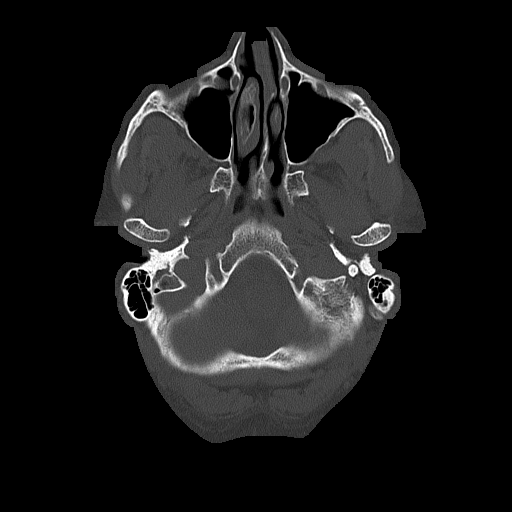
[im 17/83  brain]
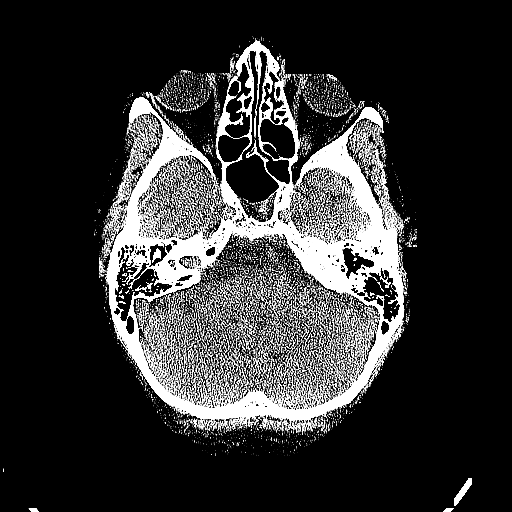
[im 25/83  brain]
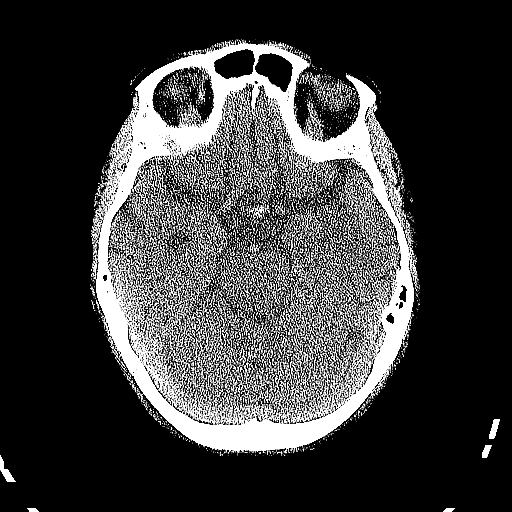
[im 33/83  brain]
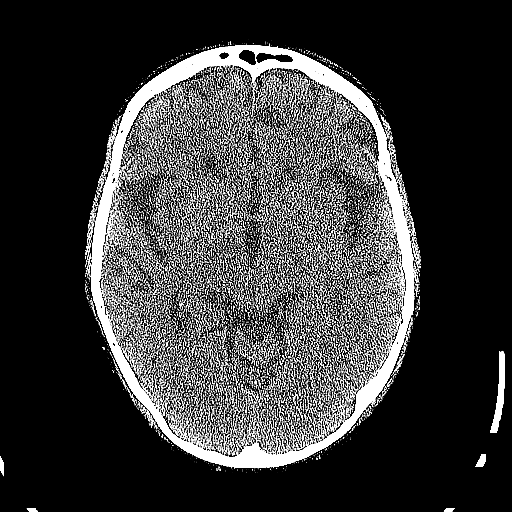
[im 42/83  brain]
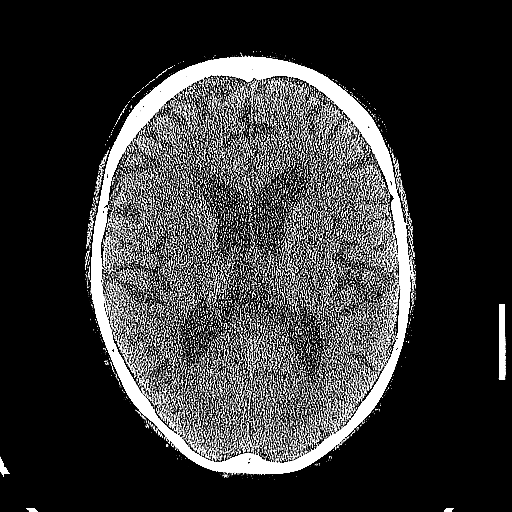
[im 42/83  bone]
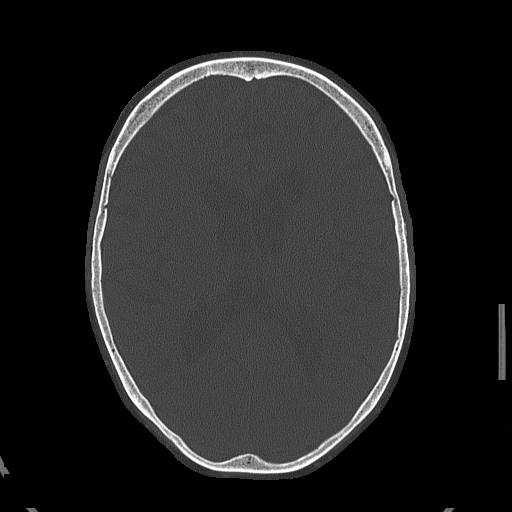
[im 50/83  brain]
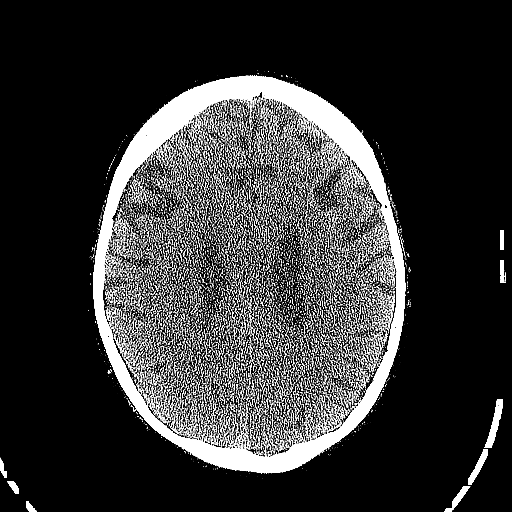
[im 58/83  brain]
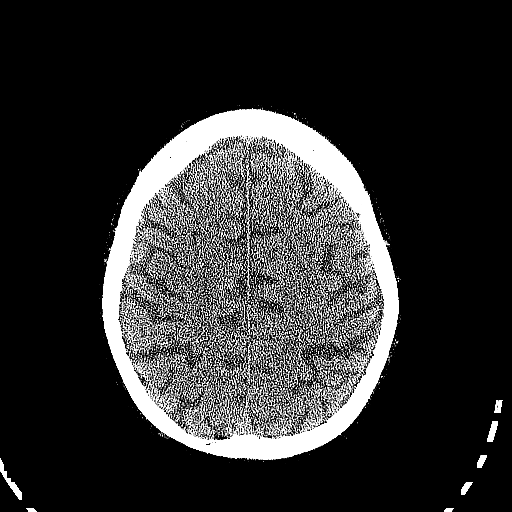
[im 66/83  brain]
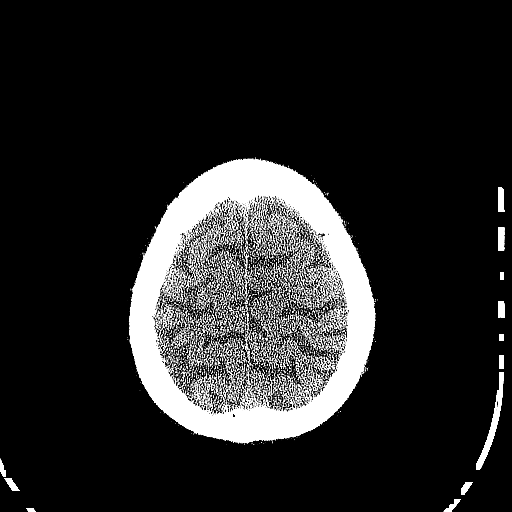
[im 74/83  brain]
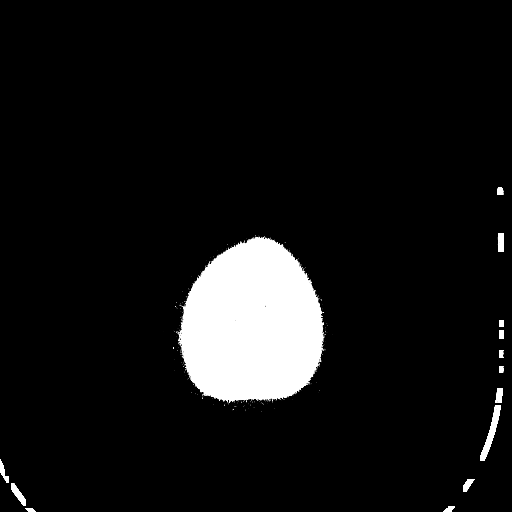
[im 74/83  bone]
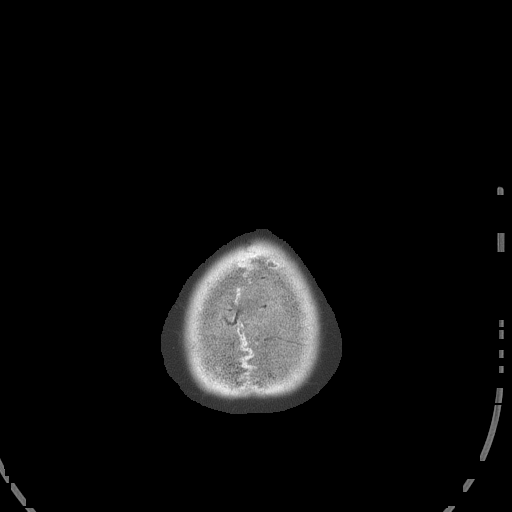

[Series 5: head 3.0 mpr cor · coronal · 0.32mm/px · 3 of 72 slices shown]
[im 24/72  brain]
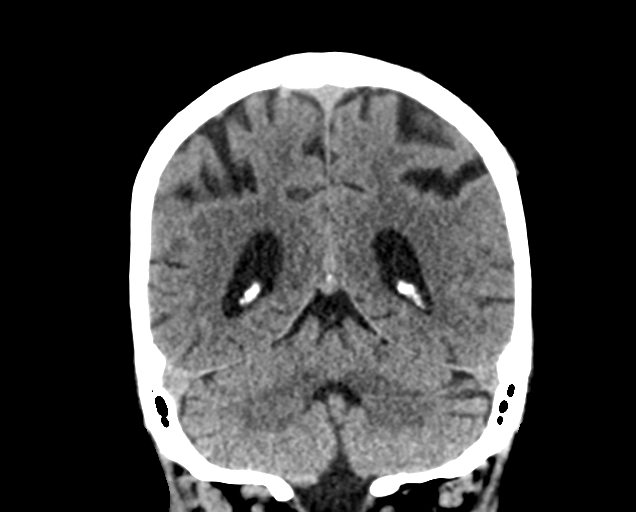
[im 32/72  brain]
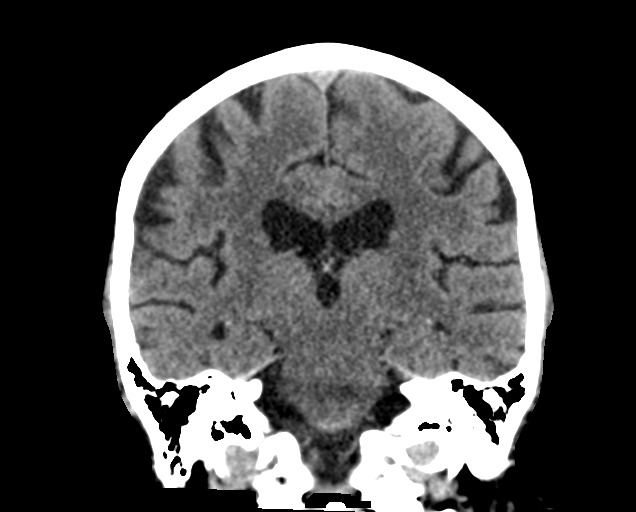
[im 40/72  brain]
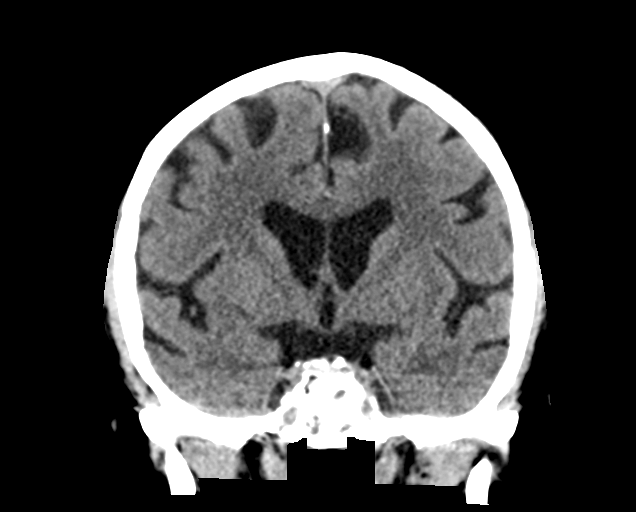

[Series 6: head 3.0 mpr sag · sagittal · 0.32mm/px · 3 of 61 slices shown]
[im 21/61  brain]
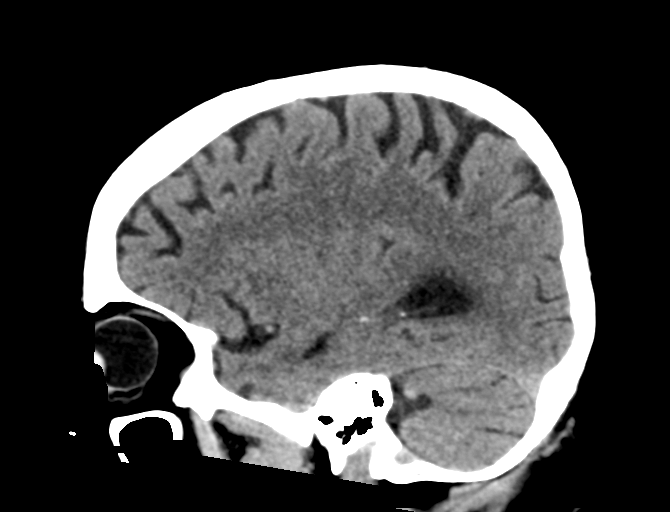
[im 31/61  brain]
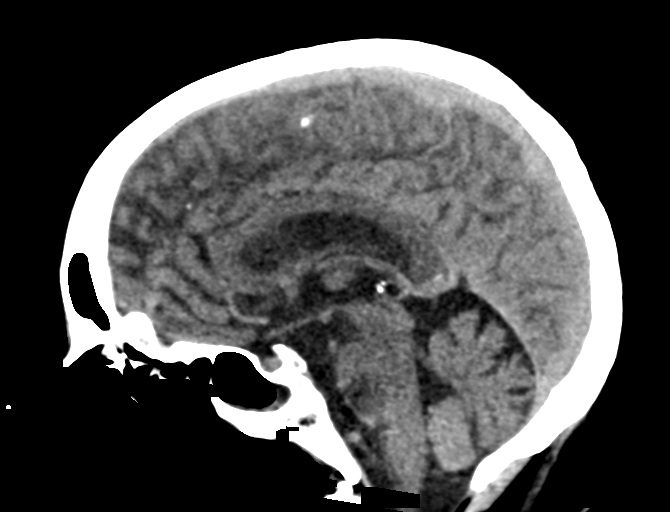
[im 41/61  brain]
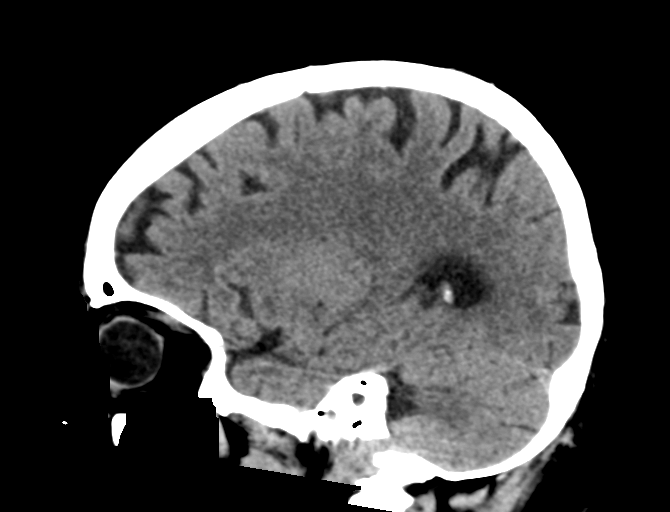

[15 of 47 positions shown; findings below may reference images not displayed]

FINDINGS: Brain: No evidence of acute infarction, hemorrhage, hydrocephalus,
extra-axial collection or mass lesion/mass effect. There is mild
cerebral volume loss with associated ex vacuo dilatation.
Periventricular white matter hypoattenuation likely represents
chronic small vessel ischemic disease.

Vascular: There are vascular calcifications in the carotid siphons.

Skull: Normal. Negative for fracture or focal lesion.

Sinuses/Orbits: There is right sphenoid sinus disease.

Other: None.
IMPRESSION: 1. No acute intracranial process.

## 2022-06-30 ENCOUNTER — Other Ambulatory Visit: Payer: Self-pay | Admitting: Internal Medicine

## 2022-07-03 ENCOUNTER — Encounter: Payer: Self-pay | Admitting: Family Medicine

## 2022-07-12 ENCOUNTER — Ambulatory Visit (INDEPENDENT_AMBULATORY_CARE_PROVIDER_SITE_OTHER): Payer: Medicare HMO | Admitting: Family Medicine

## 2022-07-12 ENCOUNTER — Encounter: Payer: Self-pay | Admitting: Family Medicine

## 2022-07-12 ENCOUNTER — Other Ambulatory Visit (INDEPENDENT_AMBULATORY_CARE_PROVIDER_SITE_OTHER): Payer: Medicare HMO

## 2022-07-12 ENCOUNTER — Other Ambulatory Visit: Payer: Self-pay | Admitting: Family Medicine

## 2022-07-12 VITALS — BP 130/81 | HR 105 | Temp 98.0°F | Ht 71.5 in | Wt 186.5 lb

## 2022-07-12 DIAGNOSIS — F411 Generalized anxiety disorder: Secondary | ICD-10-CM

## 2022-07-12 DIAGNOSIS — E782 Mixed hyperlipidemia: Secondary | ICD-10-CM | POA: Diagnosis not present

## 2022-07-12 DIAGNOSIS — R413 Other amnesia: Secondary | ICD-10-CM | POA: Diagnosis not present

## 2022-07-12 DIAGNOSIS — Z0000 Encounter for general adult medical examination without abnormal findings: Secondary | ICD-10-CM | POA: Diagnosis not present

## 2022-07-12 DIAGNOSIS — E559 Vitamin D deficiency, unspecified: Secondary | ICD-10-CM | POA: Diagnosis not present

## 2022-07-12 DIAGNOSIS — R69 Illness, unspecified: Secondary | ICD-10-CM | POA: Diagnosis not present

## 2022-07-12 LAB — CBC
HCT: 40.4 % (ref 36.0–46.0)
Hemoglobin: 13.5 g/dL (ref 12.0–15.0)
MCHC: 33.4 g/dL (ref 30.0–36.0)
MCV: 95.2 fl (ref 78.0–100.0)
Platelets: 152 10*3/uL (ref 150.0–400.0)
RBC: 4.25 Mil/uL (ref 3.87–5.11)
RDW: 13.6 % (ref 11.5–15.5)
WBC: 4.1 10*3/uL (ref 4.0–10.5)

## 2022-07-12 LAB — COMPREHENSIVE METABOLIC PANEL
ALT: 34 U/L (ref 0–35)
AST: 43 U/L — ABNORMAL HIGH (ref 0–37)
Albumin: 4.3 g/dL (ref 3.5–5.2)
Alkaline Phosphatase: 44 U/L (ref 39–117)
BUN: 28 mg/dL — ABNORMAL HIGH (ref 6–23)
CO2: 26 mEq/L (ref 19–32)
Calcium: 9.5 mg/dL (ref 8.4–10.5)
Chloride: 103 mEq/L (ref 96–112)
Creatinine, Ser: 1.25 mg/dL — ABNORMAL HIGH (ref 0.40–1.20)
GFR: 41.42 mL/min — ABNORMAL LOW (ref >60.00)
Glucose, Bld: 98 mg/dL (ref 70–99)
Potassium: 3.9 mEq/L (ref 3.5–5.1)
Sodium: 140 mEq/L (ref 135–145)
Total Bilirubin: 0.5 mg/dL (ref 0.2–1.2)
Total Protein: 7.5 g/dL (ref 6.0–8.3)

## 2022-07-12 LAB — LIPID PANEL
Cholesterol: 158 mg/dL (ref 0–200)
HDL: 52.7 mg/dL (ref >39.00)
LDL Cholesterol: 83 mg/dL (ref 0–99)
NonHDL: 105.37
Total CHOL/HDL Ratio: 3
Triglycerides: 112 mg/dL (ref 0.0–149.0)
VLDL: 22.4 mg/dL (ref 0.0–40.0)

## 2022-07-12 LAB — T4, FREE: Free T4: 0.77 ng/dL (ref 0.60–1.60)

## 2022-07-12 LAB — VITAMIN B12: Vitamin B-12: 229 pg/mL (ref 211–911)

## 2022-07-12 LAB — VITAMIN D 25 HYDROXY (VIT D DEFICIENCY, FRACTURES): VITD: 40.1 ng/mL (ref 30.00–100.00)

## 2022-07-12 LAB — TSH: TSH: 6.98 u[IU]/mL — ABNORMAL HIGH (ref 0.35–5.50)

## 2022-07-12 MED ORDER — CITALOPRAM HYDROBROMIDE 20 MG PO TABS: 20.0000 mg | ORAL_TABLET | Freq: Every day | ORAL | 3 refills | Status: DC

## 2022-07-12 NOTE — Patient Instructions (Signed)
Give [place] 2-3 business days to get the results of your labs back.   Keep the diet clean and stay active.  Add some weight resistance exercise to your regimen.  Let us know if you need anything.

## 2022-07-12 NOTE — Progress Notes (Signed)
Chief Complaint  Patient presents with   Annual Exam     Well Woman Emily Parrish is here for a complete physical.   Her last physical was >1 year ago.  Current diet: in general, a "healthy" diet. Current exercise: walking. Weight is stable and she denies daytime fatigue. Seatbelt? Yes Advanced directive? Yes  Health Maintenance Shingrix- Yes DEXA- Yes Tetanus- Yes Pneumonia- Yes Hep C screen- Yes  GAD Patient takes Celexa 40 mg daily.  She reports compliance and no adverse effects.  She reports this is very helpful for her.  She does not wish to make a change.  She is not following with a therapist/counselor.  No self-medication.  No homicidal or suicidal ideation.  Mixed Hyperlipidemia Patient presents for mixed hyperlipidemia follow up. Currently being treated with Crestor 5 mg daily and compliance with treatment thus far has been good. She denies myalgias. Diet/exercise as above. The patient is not known to have coexisting coronary artery disease.  Memory troubles Over the past year, the patient has been experiencing intermittent episodes of losing her train of thought.  She will have to focus on a goal in mind or rethink what she was doing.  She is not getting lost or going out into public and forgetting what year and she is going to do.  Again, she states her mood is very well-controlled.  She gets around 8 hours of sleep nightly.  Past Medical History:  Diagnosis Date   Anxiety    Esophageal reflux    Fatty liver     NASH--s/p liver Bx 2011, early portal fibrosis    Goiter    Hiatal hernia    OSA (obstructive sleep apnea) 12/07/2010    does not use CPAP   Urethral stricture    uretheral stricture , s/p dilatation per urology ~ 2005   Vitamin B 12 deficiency    mild    Vitamin D deficiency      Past Surgical History:  Procedure Laterality Date   APPENDECTOMY     BIOPSY THYROID  2016   Consistent w/ benign follicular nodule   DRUG INDUCED ENDOSCOPY N/A  12/15/2020   Procedure: DRUG INDUCED ENDOSCOPY;  Surgeon: Melida Quitter, MD;  Location: [facility];  Service: ENT;  Laterality: N/A;   ESOPHAGOGASTRODUODENOSCOPY  2005   [place] Right 06/08/2021   Procedure: IMPLANTATION OF HYPOGLOSSAL NERVE STIMULATOR;  Surgeon: Melida Quitter, MD;  Location: [facility];  Service: ENT;  Laterality: Right;    Medications  Current Outpatient Medications on File Prior to Visit  Medication Sig Dispense Refill   caffeine 200 MG TABS tablet Take 200 mg by mouth daily.     Calcium-Magnesium-Vitamin D 300-20-200 MG-MG-UNIT CHEW Chew 1 capsule by mouth daily.     citalopram (CELEXA) 20 MG tablet TAKE 1 TABLET DAILY 90 tablet 3   Coenzyme Q10 (COQ-10 PO) Take 1 tablet by mouth daily.     Docosahexaenoic Acid (DHA OMEGA 3 PO) Take 2 capsules by mouth 2 (two) times daily.     EPINEPHrine (EPIPEN 2-PAK) 0.3 mg/0.3 mL IJ SOAJ injection Inject 0.3 mLs (0.3 mg total) into the muscle as needed for anaphylaxis. 1 each 1   fenofibrate (TRICOR) 48 MG tablet TAKE 1 TABLET DAILY 90 tablet 0   guaiFENesin (MUCINEX) 600 MG 12 hr tablet Take 2 tablets (1,200 mg total) by mouth 2 (two) times daily. 30 tablet 2   ibuprofen (ADVIL) 200 MG tablet Take  400 mg by mouth every 6 (six) hours as needed for headache or moderate pain.     LUTEIN PO Take 1 capsule by mouth 2 (two) times daily.     Melatonin 10 MG TABS Take 10 mg by mouth at bedtime.     Milk Thistle 1000 MG CAPS Take 1,000 mg by mouth daily.     Multiple Vitamins-Minerals (MULTIVITAMIN PO) Take 1 tablet by mouth daily.     RABEprazole (ACIPHEX) 20 MG tablet TAKE 1 TABLET DAILY 90 tablet 3   rosuvastatin (CRESTOR) 5 MG tablet TAKE 1 TABLET DAILY 90 tablet 3   TURMERIC PO Take 1 tablet by mouth daily.     Allergies Allergies  Allergen Reactions   Fluticasone-Salmeterol     rapid heart rate, medication is inhalent    Review of Systems: Constitutional:  no fevers Eye:  no  recent significant change in vision Ears:  No changes in hearing Nose/Mouth/Throat:  no complaints of nasal congestion, no sore throat Cardiovascular: no chest pain Respiratory:  No shortness of breath Gastrointestinal:  No change in bowel habits GU:  Female: negative for dysuria Integumentary:  no abnormal skin lesions reported Neurologic:  no headaches Endocrine:  denies unexplained weight changes  Exam BP 130/81 (BP Location: Left Arm, Patient Position: Sitting, Cuff Size: Normal)   Pulse (!) 105   Temp 98 F (36.7 C) (Oral)   Ht 5' 11.5" (1.816 m)   Wt 186 lb 8 oz (84.6 kg)   LMP 07/10/1996   SpO2 99%   BMI 25.65 kg/m  General:  well developed, well nourished, in no apparent distress Skin:  no significant moles, warts, or growths Head:  no masses, lesions, or tenderness Eyes:  pupils equal and round, sclera anicteric without injection Ears:  canals without lesions, TMs shiny without retraction, no obvious effusion, no erythema Nose:  nares patent, mucosa normal, and no drainage Throat/Pharynx:  lips and gingiva without lesion; tongue and uvula midline; non-inflamed pharynx; no exudates or postnasal drainage Neck: neck supple without adenopathy, thyromegaly, or masses Lungs:  clear to auscultation, breath sounds equal bilaterally, no respiratory distress Cardio:  regular rate and rhythm, no bruits or LE edema Abdomen:  abdomen soft, nontender; bowel sounds normal; no masses or organomegaly Genital: Deferred Neuro:  gait normal; deep tendon reflexes normal and symmetric Psych: well oriented with normal range of affect and appropriate judgment/insight  Assessment and Plan  Well adult exam  Mixed hyperlipidemia - Plan: Comprehensive metabolic panel, Lipid panel  Memory changes - Plan: TSH, CBC, B12  GAD (generalized anxiety disorder)  Vitamin D deficiency - Plan: VITAMIN D 25 Hydroxy (Vit-D Deficiency, Fractures)   Well 77 y.o. female. Counseled on diet and  exercise. Mixed hyperlipidemia: Chronic, stable.  Continue Crestor 5 mg daily.  Check labs. Memory changes: TSH, CBC, B12.  It sounds like very mild cognitive impairment.  This is almost certainly related to age.  She will add weight resistance exercise and continue to stay active and do mentally stimulating activities.  We also discussed further imaging of the brain and having a formal neuropsychology evaluation.  She would like to hold off on this for now. Generalized anxiety: Chronic, stable.  Continue Celexa 40 mg daily. Other orders as above. Follow up 1 year. The patient voiced understanding and agreement to the plan.  [place], DO 07/12/22 7:44 AM

## 2022-07-26 ENCOUNTER — Other Ambulatory Visit (INDEPENDENT_AMBULATORY_CARE_PROVIDER_SITE_OTHER): Payer: Medicare HMO

## 2022-07-26 DIAGNOSIS — E782 Mixed hyperlipidemia: Secondary | ICD-10-CM

## 2022-07-26 DIAGNOSIS — R413 Other amnesia: Secondary | ICD-10-CM

## 2022-07-26 DIAGNOSIS — E559 Vitamin D deficiency, unspecified: Secondary | ICD-10-CM

## 2022-07-26 DIAGNOSIS — F411 Generalized anxiety disorder: Secondary | ICD-10-CM

## 2022-07-26 DIAGNOSIS — Z0000 Encounter for general adult medical examination without abnormal findings: Secondary | ICD-10-CM | POA: Diagnosis not present

## 2022-07-26 DIAGNOSIS — R69 Illness, unspecified: Secondary | ICD-10-CM | POA: Diagnosis not present

## 2022-07-26 LAB — LIPID PANEL
Cholesterol: 165 mg/dL (ref 0–200)
HDL: 48.5 mg/dL (ref >39.00)
LDL Cholesterol: 83 mg/dL (ref 0–99)
NonHDL: 116.57
Total CHOL/HDL Ratio: 3
Triglycerides: 169 mg/dL — ABNORMAL HIGH (ref 0.0–149.0)
VLDL: 33.8 mg/dL (ref 0.0–40.0)

## 2022-07-26 LAB — COMPREHENSIVE METABOLIC PANEL
ALT: 32 U/L (ref 0–35)
AST: 39 U/L — ABNORMAL HIGH (ref 0–37)
Albumin: 4.3 g/dL (ref 3.5–5.2)
Alkaline Phosphatase: 42 U/L (ref 39–117)
BUN: 23 mg/dL (ref 6–23)
CO2: 29 mEq/L (ref 19–32)
Calcium: 9.4 mg/dL (ref 8.4–10.5)
Chloride: 104 mEq/L (ref 96–112)
Creatinine, Ser: 1.17 mg/dL (ref 0.40–1.20)
GFR: 44.83 mL/min — ABNORMAL LOW (ref >60.00)
Glucose, Bld: 96 mg/dL (ref 70–99)
Potassium: 4.1 mEq/L (ref 3.5–5.1)
Sodium: 140 mEq/L (ref 135–145)
Total Bilirubin: 0.4 mg/dL (ref 0.2–1.2)
Total Protein: 7.6 g/dL (ref 6.0–8.3)

## 2022-07-26 LAB — HEPATIC FUNCTION PANEL
ALT: 32 U/L (ref 0–35)
AST: 39 U/L — ABNORMAL HIGH (ref 0–37)
Albumin: 4.3 g/dL (ref 3.5–5.2)
Alkaline Phosphatase: 42 U/L (ref 39–117)
Bilirubin, Direct: 0.1 mg/dL (ref 0.0–0.3)
Total Bilirubin: 0.4 mg/dL (ref 0.2–1.2)
Total Protein: 7.6 g/dL (ref 6.0–8.3)

## 2022-07-31 ENCOUNTER — Telehealth: Payer: Self-pay | Admitting: Family Medicine

## 2022-07-31 NOTE — Telephone Encounter (Signed)
Pt was called to schedule Medicare AWV. Pt opted to continue facilitating visits with [employer].

## 2022-08-11 ENCOUNTER — Other Ambulatory Visit: Payer: Self-pay | Admitting: Family Medicine

## 2022-08-16 NOTE — Telephone Encounter (Signed)
error 

## 2022-08-31 ENCOUNTER — Encounter: Payer: Self-pay | Admitting: Neurology

## 2022-08-31 ENCOUNTER — Ambulatory Visit: Payer: Medicare HMO | Admitting: Neurology

## 2022-08-31 VITALS — Ht 71.75 in | Wt 189.0 lb

## 2022-08-31 DIAGNOSIS — G4733 Obstructive sleep apnea (adult) (pediatric): Secondary | ICD-10-CM

## 2022-08-31 DIAGNOSIS — Z9682 Presence of neurostimulator: Secondary | ICD-10-CM

## 2022-08-31 NOTE — Progress Notes (Signed)
SLEEP MEDICINE CLINIC    Provider:  Larey Seat, MD  Primary Care Physician:  Shelda Pal, [address] [place] [address]     Referring Provider: Ileene Rubens [address] [place],  [place] [address]          Chief Complaint according to patient   Patient presents with:     New Patient (Initial Visit)     Pt alone, rm 10. Presented initially to discuss possibility of inspire. She had a SS 2012  with dr Halford Chessman and was started on CPAP. She quit using CPAP because she would take it off in middle of night. She is waking up tired .Sleep apnea may still be a concern but she doesn't want to return to PAP therapy. Daily naps.  We repeated a HST- and the patient had mild-moderate apnea overall, no hypoxemia, some REM accentuation. Deemed to be an inspire candidate , she went for implantation with ENT-Dr Redmond Baseman in November 2022.       HISTORY OF PRESENT ILLNESS:  Emily Parrish is a 77 y.o. year old Caucasian female patient seen here on 08/31/2022 , RV after titration study for INSPIRE. 08-31-2022: RV on Inspire -   Emily Parrish feels that her sleep has been restorative and refreshing.  Her Epworth sleepiness score was endorsed 6 out of 24 points, her fatigue severity score was endorsed at  25/63.  The wave forms of her inspire therapy are excellent, she has very good usage we have not made changes to her settings today the amplitude is 0.9 V patient control is between 0.7 and 1.1 mode.   Stopped delay remains at 15 minutes pause time at 15 minutes and therapy duration is set for 10 hours.    These waveforms were obtained today here in the sleep lab on 31 August 2022 and her compliance has been remarkable.  She has a compliance of 93% for the.  From November 26 through February 24.th.     Addendum:       11-30-2021.                   Note     IMPRESSION: incoming V setting was 1.1V  and had to be  reduced after this study.   HST confirmed Obstructive Sleep Apnea (OSA) was controlled under an inspire setting of 0.9V with supine sleep being recorded. No hypoxia was noted.  Periodic Limb Movement Disorder (PLMD) persisted.   RECOMMENDATIONS:  Reduce to 0.9 V setting of Inspire, start a pause delay of 15 minutes. Larey Seat, MD     In a RV after after activation. She has contracted an upper respiratory infection, and has been on ATB, her voice is hoarse an she is coughing and clearing her throat. Not febrile. Reportedly negative for Covid.  Her range of inspire Voltage was not changed but 3 settings adjusted to comfort, settings for 10 minute pause, start delay to 60 minutes and total therapy time was increased to 10 hours. Capture morning REM sleep.     HST 07-29-2021: Moderate apnea, no hypoxemia, REM accentuated , but NREM AHI was still elevated- she was a possible candidate for INSPIRE. Was seen by Dr ENT : Redmond Baseman, MD . Implantation on 06-08-2021. Here for activation-  Tongue and facial movements were to be evaluated today.no dysarthria, no dysphonia. No dysphagia.  Scar is well healed.  Patient feels well.  We  started with activation - starting at 0.1 V- and the patient first felt anything at  0.7  V.  " Waking the hypoglossus nerve "  we worked down from 0.7V. to 0.4 V when the patient still felt the inspire activation.  Movement of the tongue seen at 0.5V. A good regular waveform was achieved. -Wave form will be documented in the addendum.   Sleep latency can be 45 minutes- so the therapy delay will be set at 45 minutes up from 30. Pause function set for 15 minutes. Total therapy time set for 9 hours.  Therapy range Voltage 0.5 to 1.5 Volt- she will advance at home.  Every Friday increase by one level.   Instructed the patient on the remote function: light indicator- white is off, green is on. Battery exchange instruction.  User instruction at the chest level.    Last  visit was 08-25-2020- and followed by a HST. OR with Dr. Redmond Baseman was 06-08-2021- Surgical notes were reviewed today . Copy provided to patient.      Dear Dr. [name].  I have the pleasure of seeing Emily Parrish on 08-25-2020, a right -handed White or Caucasian female with a histor of OSA, diagnosed in 2012 and last seen at [facility] in 2017 .  She has a past medical history of Anxiety, Esophageal reflux, Fatty liver, Goiter, Hiatal hernia, OSA (obstructive sleep apnea) (12/07/2010), Urethral stricture, Vitamin B 12 deficiency, and Vitamin D deficiency.   The patient had the first sleep study in 2012, none of her studies were available in Epic : she was a patient of Dr. Halford Chessman at the time. Sleep relevant medical history: liposuction abdominal , appendectomy , no other surgical history- none ENT wise.  Family medical /sleep history:  Elder brother with OSA, deviated septum.   Social history:  Retired from career in [employer] , and lives in a household with 2 cats, no children.   Tobacco use: none .  ETOH use ; 1 glass of wine most evenings.  Caffeine intake in form of Coffee( /) Soda( / Tea ( 3-4 glasses -any time ) or energy drinks. Regular exercise in form of walking.    Sleep habits are as follows: The patient's dinner time is between 7  PM. The patient goes to bed at 11 PM and continues to sleep from midnight on for 7-8 hours, wakes for one bathroom break. The preferred sleep position is on either side, with the support of 1 pillow.  Dreams are reportedly frequent/vivid. 8 AM is the usual rise time. The patient wakes up spontaneously.  She reports not feeling refreshed or restored in AM, with symptoms such as dry mouth , rarely morning headaches, and residual fatigue.  Naps are taken frequently, lasting from 30 to 60 minutes and are more refreshing than nocturnal sleep.   She has not been able to use CPAP over 4 hours, she just looses it- and she tried a dental device without success.    Review  of Systems: reviewed o 07-29-2021-  inspire was not yet activated.  Out of a complete 14 system review, the patient complains of only the following symptoms, and all other reviewed systems are negative.:  Fatigue, sleepiness , may be snoring,  Insomnia - trouble to initiate sleep.    How likely are you to doze in the following situations: 0 = not likely, 1 = slight chance, 2 = moderate chance, 3 = high chance   Sitting and Reading? Watching Television? Sitting inactive in a  public place (theater or meeting)? As a passenger in a car for an hour without a break? Lying down in the afternoon when circumstances permit? Sitting and talking to someone? Sitting quietly after lunch without alcohol? In a car, while stopped for a few minutes in traffic?  Her Epworth sleepiness score was endorsed 6 out of 24 points, her fatigue severity score was endorsed at  25/63.     At activation  = 9/ 24 points   FSS endorsed at 30/ 63 points.  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, clinical  -- independent  Tobacco Use   Smoking status: Former    Years: 10.00    Types: Cigarettes    Quit date: 07/11/1971    Years since quitting: 51.1   Smokeless tobacco: Never   Tobacco comments:    used to smoke--1 1/2 pack per week  Vaping Use   Vaping Use: Never used  Substance and Sexual Activity   Alcohol use: Yes    Alcohol/week: 5.0 - 6.0 standard drinks of alcohol    Types: 5 - 6 Glasses of wine per week    Comment: social   Drug use: No   Sexual activity: Not Currently    Partners: Male    Birth control/protection: Post-menopausal  Other Topics Concern   Not on file  Social History Narrative   Lives by herself , no family in town   emergency contact Graceann Congress  [phone])   Drinks about 1-2 caffeine drinks a day    Social Determinants of Health   Financial Resource Strain: Not on file  Food Insecurity: Not on file  Transportation Needs: Not on file  Physical Activity: Not on file  Stress: Not on file  Social Connections: Not on file    Family History  Problem Relation Age of Onset   Coronary artery disease Maternal Grandfather    Breast cancer Maternal Aunt        died at age 15   Scleroderma Mother    Emphysema Father    Alcohol abuse Father    Stroke Brother    Colon cancer Neg Hx    Diabetes Neg Hx     Past Medical History:  Diagnosis Date   Anxiety    Esophageal reflux    Fatty liver     NASH--s/p liver Bx 2011, early portal fibrosis    Goiter    Hiatal hernia    OSA (obstructive sleep apnea) 12/07/2010    does not use CPAP   Urethral stricture    uretheral stricture , s/p dilatation per urology ~ 2005   Vitamin B 12 deficiency    mild    Vitamin D deficiency     Past Surgical History:  Procedure Laterality Date   APPENDECTOMY     BIOPSY THYROID  2016   Consistent w/ benign follicular nodule   DRUG INDUCED ENDOSCOPY N/A 12/15/2020   Procedure: DRUG INDUCED ENDOSCOPY;  Surgeon: Melida Quitter, MD;  Location: [facility];  Service: ENT;  Laterality: N/A;   ESOPHAGOGASTRODUODENOSCOPY  2005   [place] STIMULATOR Right 06/08/2021   Procedure: IMPLANTATION OF HYPOGLOSSAL NERVE STIMULATOR;  Surgeon: Melida Quitter, MD;  [facility];  Service: ENT;  Laterality: Right;     Current Outpatient Medications on File Prior to Visit  Medication Sig Dispense Refill   caffeine 200 MG TABS tablet Take 200 mg by mouth daily.     Calcium-Magnesium-Vitamin D 300-20-200 MG-MG-UNIT CHEW Chew 1 capsule by mouth daily.     citalopram (CELEXA) 20 MG tablet Take 1 tablet (20 mg total) by mouth daily. 90 tablet 3   Coenzyme Q10 (COQ-10 PO) Take 1 tablet by mouth daily.     Docosahexaenoic Acid  (DHA OMEGA 3 PO) Take 2 capsules by mouth 2 (two) times daily.     EPINEPHrine (EPIPEN 2-PAK) 0.3 mg/0.3 mL IJ SOAJ injection Inject 0.3 mLs (0.3 mg total) into the muscle as needed for anaphylaxis. 1 each 1   fenofibrate (TRICOR) 48 MG tablet TAKE 1 TABLET DAILY 90 tablet 0   guaiFENesin (MUCINEX) 600 MG 12 hr tablet Take 2 tablets (1,200 mg total) by mouth 2 (two) times daily. 30 tablet 2   ibuprofen (ADVIL) 200 MG tablet Take 400 mg by mouth every 6 (six) hours as needed for headache or moderate pain.     LUTEIN PO Take 1 capsule by mouth 2 (two) times daily.     Melatonin 10 MG TABS Take 10 mg by mouth at bedtime.     Milk Thistle 1000 MG CAPS Take 1,000 mg by mouth daily.     Multiple Vitamins-Minerals (MULTIVITAMIN PO) Take 1 tablet by mouth daily.     RABEprazole (ACIPHEX) 20 MG tablet TAKE 1 TABLET DAILY 90 tablet 3   rosuvastatin (CRESTOR) 5 MG tablet TAKE 1 TABLET DAILY 90 tablet 3   TURMERIC PO Take 1 tablet by mouth daily.     No current facility-administered medications on file prior to visit.    Allergies  Allergen Reactions   Fluticasone-Salmeterol     rapid heart rate, medication is inhalent    Physical exam:  Today's Vitals   08/31/22 1032  Weight: 189 lb (85.7 kg)  Height: 5' 11.75" (1.822 m)    Body mass index is 25.81 kg/m.   Wt Readings from Last 3 Encounters:  08/31/22 189 lb (85.7 kg)  07/12/22 186 lb 8 oz (84.6 kg)  05/31/22 187 lb 6.4 oz (85 kg)     Ht Readings from Last 3 Encounters:  08/31/22 5' 11.75" (1.822 m)  07/12/22 5' 11.5" (1.816 m)  05/31/22 5' 11"$  (1.803 m)     No neurological exam was performed on 07-29-2021- General: The patient is awake, alert and appears not in acute distress. The patient is well groomed. Head: Normocephalic, atraumatic. Neck is supple. Mallampati 1,  neck circumference: 14.5  inches .  Nasal airflow patent. Retrognathia is not seen.  Dental status: top bridge.    Neurologic exam : The patient is awake and  alert, oriented to place and time.   Memory subjective described as intact.  Attention span & concentration ability appears normal.  Speech is fluent,  without  dysarthria, dysphonia or aphasia.  Mood and affect are appropriate.   Cranial nerves: no loss of smell or taste reported   Facial sensation intact to fine touch.  Facial motor strength is symmetric and tongue and uvula move midline.  Tonge protrusion in midline, tongue in cheek strength equal on both sides.  Neck ROM : rotation, tilt and flexion extension were normal for age and shoulder shrug was symmetrical.      I would like to thank Shelda Pal,  [address],  [place] [address] for allowing me to meet with and to take care of this pleasant patient.   IRV in 6 months following a HST at that time on INSPIRE.   Rv in 6 months for HST.   Electronically signed by: Larey Seat, MD 08/31/2022 10:47 AM  [facility] Board certified by The [employer] of Sleep Medicine and Diplomate of the [employer] of Sleep Medicine. Board certified In Neurology through the [place], Fellow of the [employer] of Neurology. Medical Director of [facility].

## 2022-09-13 IMAGING — US US ABDOMEN COMPLETE
1 series · 14 of 25 positions shown · non-contrast
Comparison: 04/15/2019

CLINICAL DATA: Non alcoholic steatohepatitis

EXAM:
ABDOMEN ULTRASOUND COMPLETE

[Series 1: us abdomen complete · 14 of 74 slices shown]
[im 1/74]
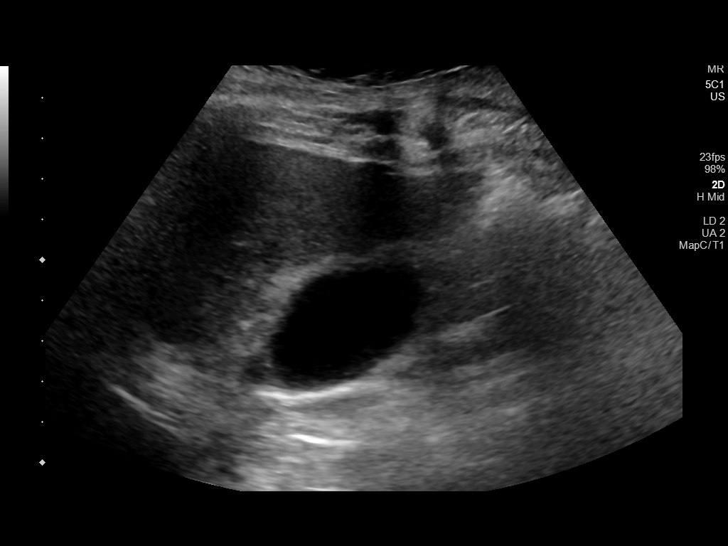
[im 7/74]
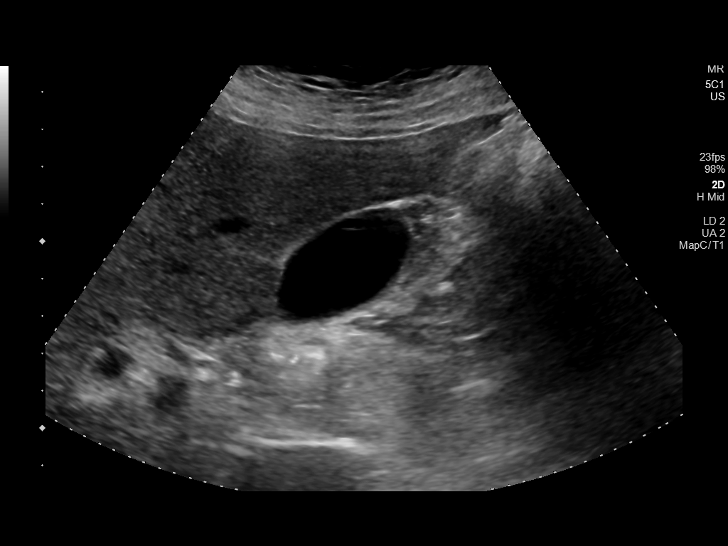
[im 13/74]
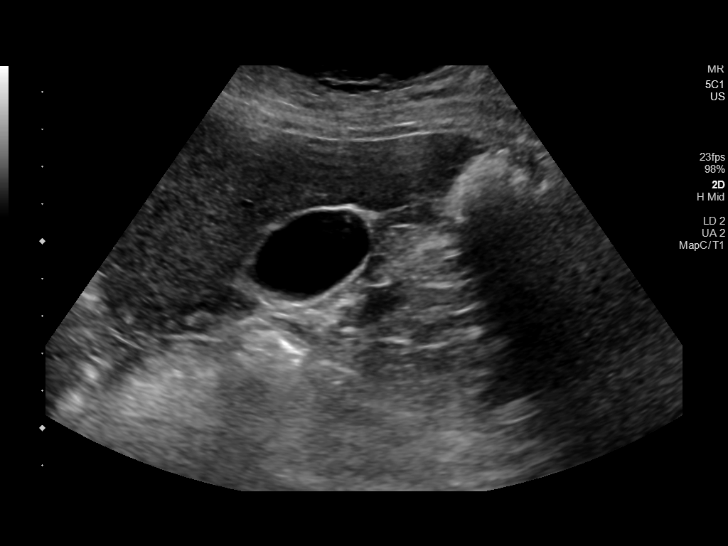
[im 19/74]
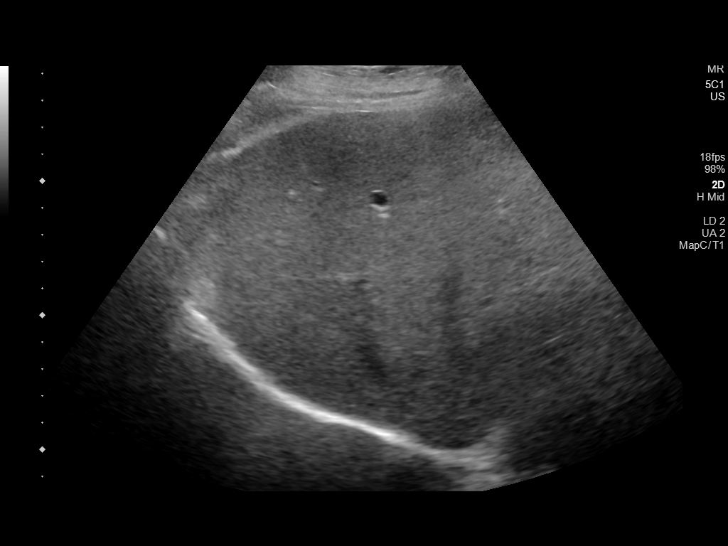
[im 25/74]
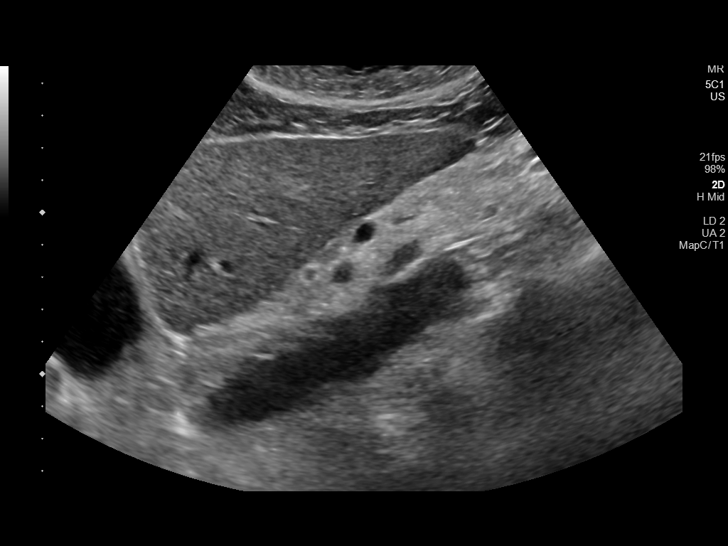
[im 28/74]
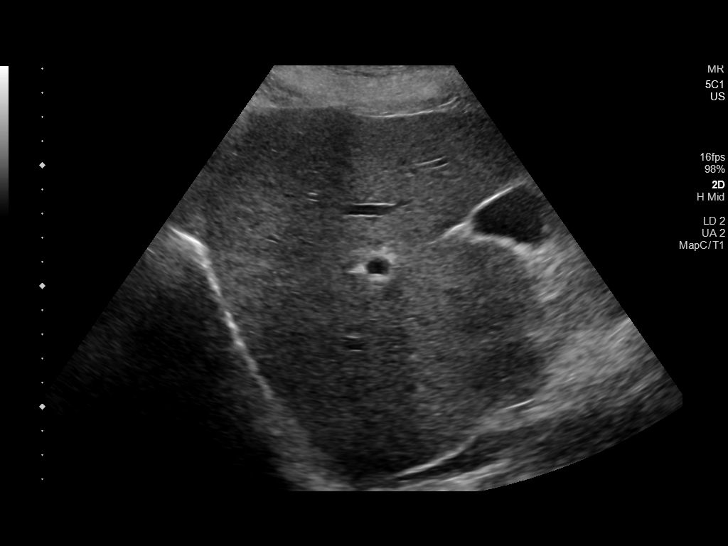
[im 34/74]
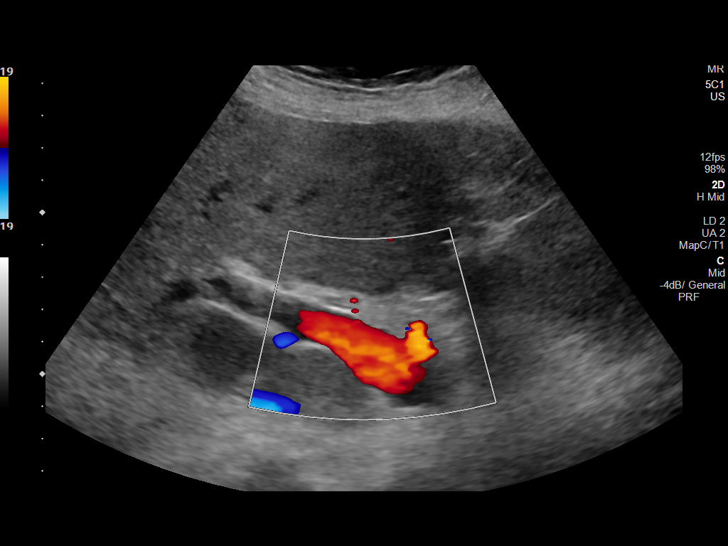
[im 40/74]
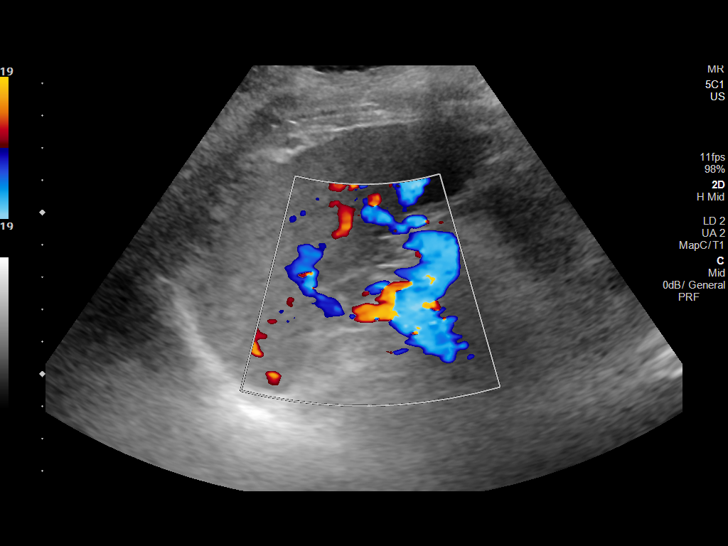
[im 46/74]
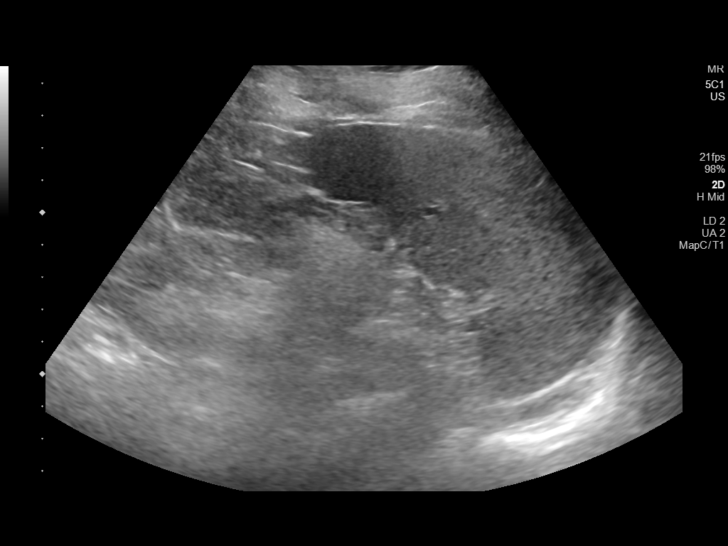
[im 49/74]
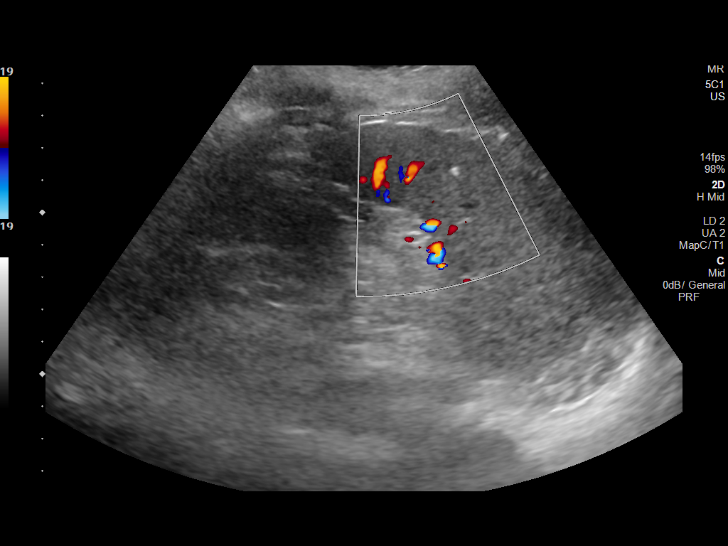
[im 55/74]
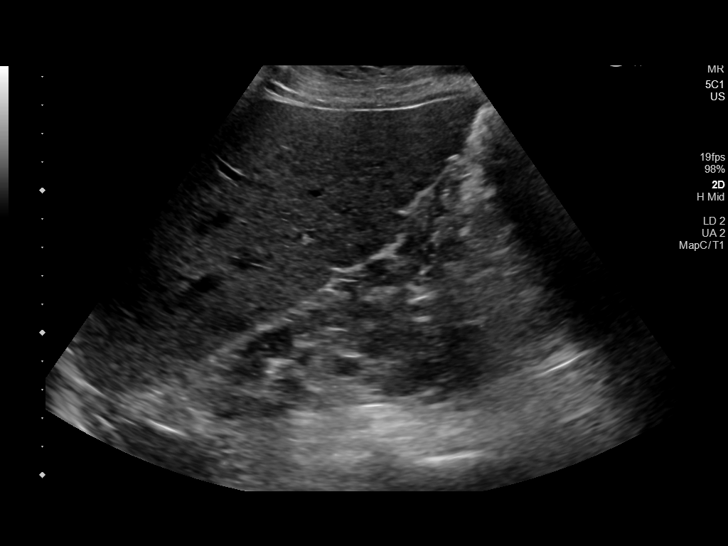
[im 61/74]
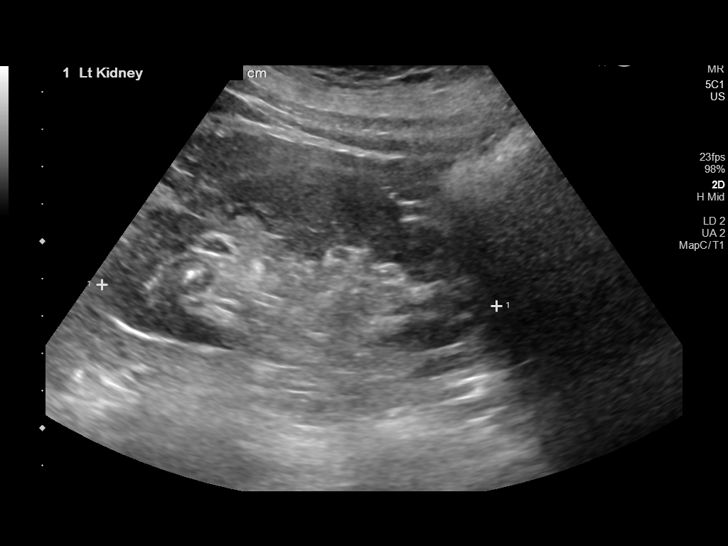
[im 67/74]
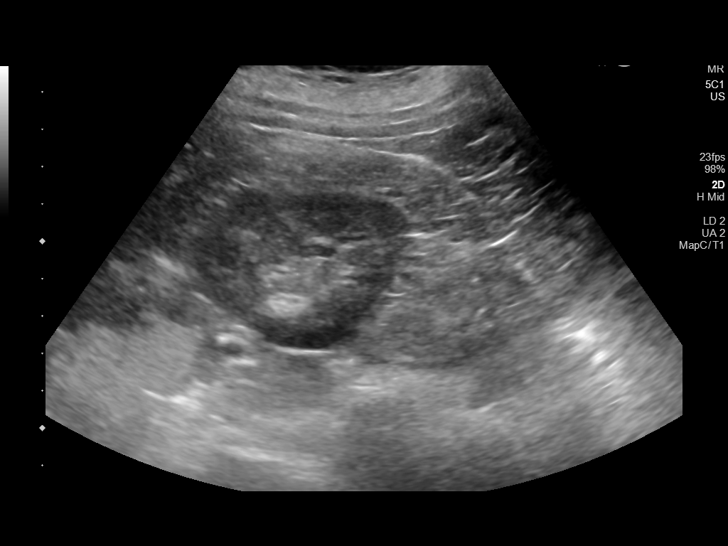
[im 74/74]
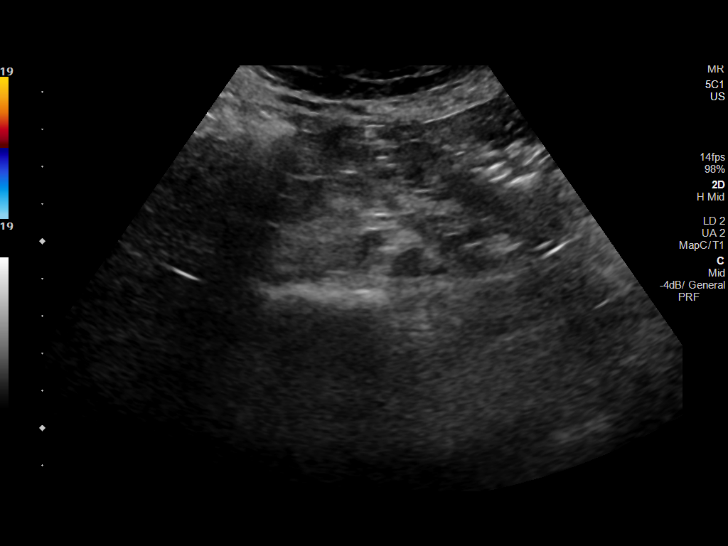

[14 of 25 positions shown; findings below may reference images not displayed]

FINDINGS: Gallbladder: No gallstones or wall thickening visualized. No
sonographic Murphy sign noted by sonographer.

Common bile duct: Diameter: 3.5 mm

Liver: Right lobe of liver cyst measures 2.1 x 2.5 x 2.4 cm.
Increased parenchymal echogenicity compatible with hepatic
steatosis. Portal vein is patent on color Doppler imaging with
normal direction of blood flow towards the liver.

IVC: No abnormality visualized.

Pancreas: Visualized portion unremarkable.

Spleen: Size and appearance within normal limits. Small echogenic
structure measuring 5 mm is identified within the spleen, likely
granuloma.

Right Kidney: Length: 10.5 cm. Echogenicity within normal limits. No
mass or hydronephrosis visualized.

Left Kidney: Length: 10.6 cm. Echogenicity within normal limits. No
mass or hydronephrosis visualized.

Abdominal aorta: No aneurysm visualized.

Other findings: None.
IMPRESSION: 1. Echogenic liver compatible with hepatic steatosis.
2. Right lobe of liver cyst.

## 2022-09-28 ENCOUNTER — Other Ambulatory Visit: Payer: Self-pay | Admitting: Family Medicine

## 2022-11-09 ENCOUNTER — Other Ambulatory Visit: Payer: Self-pay | Admitting: Family Medicine

## 2022-12-18 ENCOUNTER — Encounter: Payer: Self-pay | Admitting: Family Medicine

## 2022-12-18 ENCOUNTER — Ambulatory Visit (INDEPENDENT_AMBULATORY_CARE_PROVIDER_SITE_OTHER): Payer: Medicare HMO | Admitting: Family Medicine

## 2022-12-18 VITALS — BP 128/64 | HR 75 | Temp 97.8°F | Resp 16 | Ht 71.0 in | Wt 183.2 lb

## 2022-12-18 DIAGNOSIS — N3000 Acute cystitis without hematuria: Secondary | ICD-10-CM

## 2022-12-18 LAB — POC URINALSYSI DIPSTICK (AUTOMATED)
Bilirubin, UA: NEGATIVE
Blood, UA: NEGATIVE
Glucose, UA: NEGATIVE
Ketones, UA: NEGATIVE
Nitrite, UA: POSITIVE
Protein, UA: POSITIVE — AB
Spec Grav, UA: 1.01 (ref 1.010–1.025)
Urobilinogen, UA: 0.2 E.U./dL
pH, UA: 7 (ref 5.0–8.0)

## 2022-12-18 MED ORDER — CEPHALEXIN 500 MG PO CAPS: 500.0000 mg | ORAL_CAPSULE | Freq: Three times a day (TID) | ORAL | 0 refills | Status: AC

## 2022-12-18 NOTE — Progress Notes (Signed)
Chief Complaint  Patient presents with   Urinary Tract Infection    Emily Parrish is a 77 y.o. female here for possible UTI.  Duration: 4 days. Symptoms: Foul odor, urinary frequency, retention Denies: hematuria, urinary hesitancy, fever, nausea, vomiting, urgency, flank pain, vaginal discharge Hx of recurrent UTI? No Denies new sexual partners.  Past Medical History:  Diagnosis Date   Anxiety    Esophageal reflux    Fatty liver     NASH--s/p liver Bx 2011, early portal fibrosis    Goiter    Hiatal hernia    OSA (obstructive sleep apnea) 12/07/2010    does not use CPAP   Urethral stricture    uretheral stricture , s/p dilatation per urology ~ 2005   Vitamin B 12 deficiency    mild    Vitamin D deficiency      BP 128/64 (BP Location: Right Arm, Patient Position: Sitting, Cuff Size: Normal)   Pulse 75   Temp 97.8 F (36.6 C) (Oral)   Resp 16   Ht 5\' 11"  (1.803 m)   Wt 183 lb 3.2 oz (83.1 kg)   LMP 07/10/1996   SpO2 97%   BMI 25.55 kg/m  General: Awake, alert, appears stated age Heart: RRR Lungs: CTAB, normal respiratory effort, no accessory muscle usage Abd: BS+, soft, NT, ND, no masses or organomegaly MSK: No CVA tenderness, neg Lloyd's sign Psych: Age appropriate judgment and insight  Acute cystitis without hematuria - Plan: POCT Urinalysis Dipstick (Automated), Urine Culture, cephALEXin (KEFLEX) 500 MG capsule  UA suggestive of infection, +LE and nitrites, will send in 7 d of Keflex. Cx pending.  Stay hydrated. Seek immediate care if pt starts to develop fevers, new/worsening symptoms, uncontrollable N/V. F/u prn. The patient voiced understanding and agreement to the plan.  Jilda Roche [place], DO 12/18/22 10:37 AM

## 2022-12-18 NOTE — Patient Instructions (Signed)
Stay hydrated.   Warning signs/symptoms: Uncontrollable nausea/vomiting, fevers, worsening symptoms despite treatment, confusion.  Give us around 2 business days to get culture back to you.  Let us know if you need anything. 

## 2022-12-19 DIAGNOSIS — Z811 Family history of alcohol abuse and dependence: Secondary | ICD-10-CM | POA: Diagnosis not present

## 2022-12-19 DIAGNOSIS — K219 Gastro-esophageal reflux disease without esophagitis: Secondary | ICD-10-CM | POA: Diagnosis not present

## 2022-12-19 DIAGNOSIS — Z87892 Personal history of anaphylaxis: Secondary | ICD-10-CM | POA: Diagnosis not present

## 2022-12-19 DIAGNOSIS — Z87440 Personal history of urinary (tract) infections: Secondary | ICD-10-CM | POA: Diagnosis not present

## 2022-12-19 DIAGNOSIS — F411 Generalized anxiety disorder: Secondary | ICD-10-CM | POA: Diagnosis not present

## 2022-12-19 DIAGNOSIS — M1990 Unspecified osteoarthritis, unspecified site: Secondary | ICD-10-CM | POA: Diagnosis not present

## 2022-12-19 DIAGNOSIS — F329 Major depressive disorder, single episode, unspecified: Secondary | ICD-10-CM | POA: Diagnosis not present

## 2022-12-19 DIAGNOSIS — E785 Hyperlipidemia, unspecified: Secondary | ICD-10-CM | POA: Diagnosis not present

## 2022-12-19 DIAGNOSIS — Z791 Long term (current) use of non-steroidal anti-inflammatories (NSAID): Secondary | ICD-10-CM | POA: Diagnosis not present

## 2022-12-19 DIAGNOSIS — Z008 Encounter for other general examination: Secondary | ICD-10-CM | POA: Diagnosis not present

## 2022-12-19 DIAGNOSIS — Z825 Family history of asthma and other chronic lower respiratory diseases: Secondary | ICD-10-CM | POA: Diagnosis not present

## 2022-12-19 DIAGNOSIS — Z87891 Personal history of nicotine dependence: Secondary | ICD-10-CM | POA: Diagnosis not present

## 2022-12-19 DIAGNOSIS — G4733 Obstructive sleep apnea (adult) (pediatric): Secondary | ICD-10-CM | POA: Diagnosis not present

## 2022-12-20 LAB — URINE CULTURE
MICRO NUMBER:: 15061854
SPECIMEN QUALITY:: ADEQUATE

## 2023-01-03 ENCOUNTER — Encounter: Payer: Self-pay | Admitting: Family Medicine

## 2023-01-04 ENCOUNTER — Encounter: Payer: Self-pay | Admitting: Family Medicine

## 2023-01-04 ENCOUNTER — Ambulatory Visit (INDEPENDENT_AMBULATORY_CARE_PROVIDER_SITE_OTHER): Payer: Medicare HMO | Admitting: Family Medicine

## 2023-01-04 VITALS — BP 115/77 | HR 87 | Temp 98.0°F | Ht 71.0 in | Wt 182.0 lb

## 2023-01-04 DIAGNOSIS — U071 COVID-19: Secondary | ICD-10-CM | POA: Diagnosis not present

## 2023-01-04 LAB — BASIC METABOLIC PANEL
BUN: 21 mg/dL (ref 6–23)
CO2: 24 mEq/L (ref 19–32)
Calcium: 9.4 mg/dL (ref 8.4–10.5)
Chloride: 104 mEq/L (ref 96–112)
Creatinine, Ser: 1.15 mg/dL (ref 0.40–1.20)
GFR: 45.62 mL/min — ABNORMAL LOW (ref >60.00)
Glucose, Bld: 94 mg/dL (ref 70–99)
Potassium: 3.6 mEq/L (ref 3.5–5.1)
Sodium: 138 mEq/L (ref 135–145)

## 2023-01-04 MED ORDER — BENZONATATE 100 MG PO CAPS: 100.0000 mg | ORAL_CAPSULE | Freq: Two times a day (BID) | ORAL | 0 refills | Status: DC | PRN

## 2023-01-04 MED ORDER — GUAIFENESIN ER 600 MG PO TB12: 1200.0000 mg | ORAL_TABLET | Freq: Two times a day (BID) | ORAL | 2 refills | Status: DC

## 2023-01-04 MED ORDER — NIRMATRELVIR/RITONAVIR (PAXLOVID) TABLET (RENAL DOSING): 2.0000 | ORAL_TABLET | Freq: Two times a day (BID) | ORAL | 0 refills | Status: AC

## 2023-01-04 MED ORDER — FLUTICASONE PROPIONATE 50 MCG/ACT NA SUSP: 2.0000 | Freq: Every day | NASAL | 6 refills | Status: DC

## 2023-01-04 MED ORDER — LIDOCAINE VISCOUS HCL 2 % MT SOLN: 15.0000 mL | OROMUCOSAL | 0 refills | Status: DC | PRN

## 2023-01-04 NOTE — Patient Instructions (Signed)
Ordering supportive treatment and Paxlovid (checking your kidney function today to ensure stable). Stop your cholesterol medication while taking this.    Over the counter medications that may be helpful for symptoms:  Guaifenesin 1200 mg extended release tabs twice daily, with plenty of water For cough and congestion Brand name: Mucinex   Pseudoephedrine 30 mg, one or two tabs every 4 to 6 hours For sinus congestion Brand name: Sudafed You must get this from the pharmacy counter.  Oxymetazoline nasal spray each morning, one spray in each nostril, for NO MORE THAN 3 days  For nasal and sinus congestion Brand name: Afrin Saline nasal spray or Saline Nasal Irrigation 3-5 times a day For nasal and sinus congestion Brand names: Ocean or AYR Fluticasone nasal spray, one spray in each nostril, each morning (after oxymetazoline and saline, if used) For nasal and sinus congestion Brand name: Flonase Warm salt water gargles  For sore throat Every few hours as needed Alternate ibuprofen 400-600 mg and acetaminophen 1000 mg every 4-6 hours For fever, body aches, headache Brand names: Motrin or Advil and Tylenol Dextromethorphan 12-hour cough version 30 mg every 12 hours  For cough Brand name: Delsym Stop all other cold medications for now (Nyquil, Dayquil, Tylenol Cold, Theraflu, etc) and other non-prescription cough/cold preparations. Many of these have the same ingredients listed above and could cause an overdose of medication.   Herbal treatments that have been shown to be helpful in some patients include: Vitamin C 1000mg  per day Vitamin D 4000iU per day Zinc 100mg  per day Quercetin 25-500mg  twice a day Melatonin 5-10mg  at bedtime  General Instructions Allow your body to rest Drink PLENTY of fluids Isolate yourself from everyone, even family, until test results have returned  If your COVID-19 test is positive Then you ARE INFECTED and you can pass the virus to others You must  quarantine from others for a minimum of  5 days since symptoms started AND You are fever free for 24 hours WITHOUT any medication to reduce fever AND Your symptoms are improving Wear mask for the next 5 days If not improved by day 5, quarantine for the full 10 days. Do not go to the store or other public areas Do not go around household members who are not known to be infected with COVID-19 If you MUST leave your area of quarantine (example: go to a bathroom you share with others in your home), you must Wear a mask Wash your hands thoroughly Wipe down any surfaces you touch Do not share food, drinks, towels, or other items with other persons Dispose of your own tissues, food containers, etc  Once you have recovered, please continue good preventive care measures, including:  wearing a mask when in public wash your hands frequently avoid touching your face/nose/eyes cover coughs/sneezes with the inside of your elbow stay out of crowds keep a 6 foot distance from others  If you develop severe shortness of breath, uncontrolled fevers, coughing up blood, confusion, chest pain, or signs of dehydration (such as significantly decreased urine amounts or dizziness with standing) please go to the ER.

## 2023-01-04 NOTE — Progress Notes (Signed)
Acute Office Visit  Subjective:     Patient ID: Emily Parrish, female    DOB: 04/19/1946, 77 y.o.   MRN: [id number]  Chief Complaint  Patient presents with   Covid Positive    HPI Patient is in today for COVID infection.   Discussed the use of AI scribe software for clinical note transcription with the patient, who gave verbal consent to proceed.  History of Present Illness   The patient, with a history of back spasms, presents with a new diagnosis of COVID-19. Symptoms began on Monday with a cough severe enough to require propping up with pillows in bed. The cough has been worsening, and the patient also reports a sore throat described as "like somebody ran a grater down my throat." The patient has been taking flu and cough medication for symptom relief. The patient also reports poor sleep and back spasms, likely due to decreased physical activity related to feeling poorly. The patient has not previously had COVID-19. Deines chest pain, dyspnea, GI/GU symptoms.   The patient also reports a blocked ear, but denies ear pain.            ROS All review of systems negative except what is listed in the HPI      Objective:    BP 115/77   Pulse 87   Temp 98 F (36.7 C) (Oral)   Ht 5\' 11"  (1.803 m)   Wt 182 lb (82.6 kg)   LMP 07/10/1996   SpO2 96%   BMI 25.38 kg/m    Physical Exam Vitals reviewed.  Constitutional:      General: She is not in acute distress.    Appearance: Normal appearance. She is ill-appearing.  HENT:     Right Ear: A middle ear effusion is present.     Left Ear: A middle ear effusion is present.  Cardiovascular:     Rate and Rhythm: Normal rate and regular rhythm.     Pulses: Normal pulses.     Heart sounds: Normal heart sounds.  Pulmonary:     Effort: Pulmonary effort is normal.     Breath sounds: Normal breath sounds. No wheezing, rhonchi or rales.  Skin:    General: Skin is warm and dry.  Neurological:     Mental Status: She is  alert and oriented to person, place, and time.  Psychiatric:        Mood and Affect: Mood normal.        Behavior: Behavior normal.        Thought Content: Thought content normal.        Judgment: Judgment normal.     No results found for any visits on 01/04/23.      Assessment & Plan:   Problem List Items Addressed This Visit   None Visit Diagnoses     COVID-19    -  Primary   Relevant Medications   benzonatate (TESSALON) 100 MG capsule   guaiFENesin (MUCINEX) 600 MG 12 hr tablet   lidocaine (XYLOCAINE) 2 % solution   fluticasone (FLONASE) 50 MCG/ACT nasal spray   nirmatrelvir/ritonavir, renal dosing, (PAXLOVID) 10 x 150 MG & 10 x 100MG  TABS   Other Relevant Orders   Basic Metabolic Panel (BMET)     Start Paxlovid (recheck renal function today to confirm appropriate dose). Hold statin while taking.  Continue supportive measures including rest, hydration, humidifier use, steam showers, warm compresses to sinuses, warm liquids with lemon and honey, and over-the-counter cough, cold, and  analgesics as needed.  Please contact office for follow-up if symptoms do not improve or worsen. Seek emergency care if symptoms become severe.   Meds ordered this encounter  Medications   benzonatate (TESSALON) 100 MG capsule    Sig: Take 1 capsule (100 mg total) by mouth 2 (two) times daily as needed for cough.    Dispense:  20 capsule    Refill:  0    Order Specific Question:   Supervising Provider    Answer:   Danise Edge A [[id number]]   guaiFENesin (MUCINEX) 600 MG 12 hr tablet    Sig: Take 2 tablets (1,200 mg total) by mouth 2 (two) times daily.    Dispense:  30 tablet    Refill:  2    Order Specific Question:   Supervising Provider    Answer:   Danise Edge A [[id number]]   lidocaine (XYLOCAINE) 2 % solution    Sig: Use as directed 15 mLs in the mouth or throat every 4 (four) hours as needed for mouth pain.    Dispense:  100 mL    Refill:  0    Order Specific Question:   Supervising  Provider    Answer:   Danise Edge A [[id number]]   fluticasone (FLONASE) 50 MCG/ACT nasal spray    Sig: Place 2 sprays into both nostrils daily.    Dispense:  16 g    Refill:  6    Order Specific Question:   Supervising Provider    Answer:   Danise Edge A [[id number]]   nirmatrelvir/ritonavir, renal dosing, (PAXLOVID) 10 x 150 MG & 10 x 100MG  TABS    Sig: Take 2 tablets by mouth 2 (two) times daily for 5 days. (Take nirmatrelvir 150 mg one tablet twice daily for 5 days and ritonavir 100 mg one tablet twice daily for 5 days) Patient GFR is 44    Dispense:  20 tablet    Refill:  0    Order Specific Question:   Supervising Provider    Answer:   Danise Edge A [[id number]]    Return if symptoms worsen or fail to improve.  Clayborne Dana, NP

## 2023-01-12 ENCOUNTER — Ambulatory Visit (INDEPENDENT_AMBULATORY_CARE_PROVIDER_SITE_OTHER): Payer: Medicare HMO | Admitting: Family

## 2023-01-12 VITALS — BP 110/76 | HR 110 | Temp 98.0°F | Resp 18 | Ht 71.0 in | Wt 178.0 lb

## 2023-01-12 DIAGNOSIS — H6501 Acute serous otitis media, right ear: Secondary | ICD-10-CM

## 2023-01-12 DIAGNOSIS — N3000 Acute cystitis without hematuria: Secondary | ICD-10-CM

## 2023-01-12 DIAGNOSIS — R300 Dysuria: Secondary | ICD-10-CM | POA: Diagnosis not present

## 2023-01-12 LAB — POCT URINALYSIS DIPSTICK
Bilirubin, UA: NEGATIVE
Glucose, UA: NEGATIVE
Ketones, UA: NEGATIVE
Nitrite, UA: POSITIVE
Protein, UA: POSITIVE — AB
Spec Grav, UA: 1.01 (ref 1.010–1.025)
Urobilinogen, UA: 0.2 E.U./dL
pH, UA: 6.5 (ref 5.0–8.0)

## 2023-01-12 MED ORDER — CEPHALEXIN 500 MG PO CAPS
500.0000 mg | ORAL_CAPSULE | Freq: Two times a day (BID) | ORAL | 0 refills | Status: AC
Start: 2023-01-12 — End: 2023-01-19

## 2023-01-12 NOTE — Assessment & Plan Note (Signed)
UA notes large leukocytes.  Will Rx with keflex 500mg  bid while we await culture.

## 2023-01-12 NOTE — Progress Notes (Signed)
Subjective:     Patient ID: Emily Parrish, female    DOB: 1946-06-11, 77 y.o.   MRN: [id number]  Chief Complaint  Patient presents with   Dysuria    Onset: 2 days    Dysuria     History of Present Illness         Pt presents today with concerns about a urinary tract infection.  She saw her PCP on 6/10 for UTI and was treated with Keflex.  Culture grew pan sensitive e.coli. She reports symptoms resolved following treatment.  She then went on vacation and developed COVID-19. She was seen by Hyman Hopes NP on 6/27 and was treated with paxlovid.  She reports her covid symptoms have resolved with the exception of some ear pressure and fatigue.   Today she reports a 2 day history of urinary frequency and foul urinary odor.      Health Maintenance Due  Topic Date Due   Medicare Annual Wellness (AWV)  04/27/2017   COVID-19 Vaccine (7 - 2023-24 season) 05/26/2022    Past Medical History:  Diagnosis Date   Anxiety    Esophageal reflux    Fatty liver     NASH--s/p liver Bx 2011, early portal fibrosis    Goiter    Hiatal hernia    OSA (obstructive sleep apnea) 12/07/2010    does not use CPAP   Urethral stricture    uretheral stricture , s/p dilatation per urology ~ 2005   Vitamin B 12 deficiency    mild    Vitamin D deficiency     Past Surgical History:  Procedure Laterality Date   APPENDECTOMY     BIOPSY THYROID  2016   Consistent w/ benign follicular nodule   DRUG INDUCED ENDOSCOPY N/A 12/15/2020   Procedure: DRUG INDUCED ENDOSCOPY;  Surgeon: Christia Reading, MD;  Location: [facility] OR;  Service: ENT;  Laterality: N/A;   ESOPHAGOGASTRODUODENOSCOPY  2005   HH   IMPLANTATION OF HYPOGLOSSAL NERVE STIMULATOR Right 06/08/2021   Procedure: IMPLANTATION OF HYPOGLOSSAL NERVE STIMULATOR;  Surgeon: Christia Reading, MD;  Location: [facility];  Service: ENT;  Laterality: Right;    Family History  Problem Relation Age of Onset   Coronary artery disease Maternal  Grandfather    Breast cancer Maternal Aunt        died at age 32   Scleroderma Mother    Emphysema Father    Alcohol abuse Father    Stroke Brother    Colon cancer Neg Hx    Diabetes Neg Hx     Social History   Socioeconomic History   Marital status: Single    Spouse name: Not on file   Number of children: 0   Years of education: college   Highest education level: Some college, no degree  Occupational History   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, dispensing  -- independent  Tobacco Use   Smoking status: Former    Years: 10    Types: Cigarettes    Quit date: 07/11/1971    Years since quitting: 51.5   Smokeless tobacco: Never   Tobacco comments:    used to smoke--1 1/2 pack per week  Vaping Use   Vaping Use: Never used  Substance and Sexual Activity   Alcohol use: Yes    Alcohol/week: 5.0 - 6.0 standard drinks of alcohol    Types: 5 - 6 Glasses of wine per week    Comment: social   Drug use: No  Sexual activity: Not Currently    Partners: Male    Birth control/protection: Post-menopausal  Other Topics Concern   Not on file  Social History Narrative   Lives by herself , no family in town   emergency contact Beverely Low [phone])   Drinks about 1-2 caffeine drinks a day    Social Determinants of Health   Financial Resource Strain: Low Risk  (01/03/2023)   Overall Financial Resource Strain (CARDIA)    Difficulty of Paying Living Expenses: Not hard at all  Food Insecurity: No Food Insecurity (01/03/2023)   Hunger Vital Sign    Worried About Running Out of Food in the Last Year: Never true    Ran Out of Food in the Last Year: Never true  Transportation Needs: No Transportation Needs (01/03/2023)   PRAPARE - Administrator, Civil Service (Medical): No    Lack of Transportation (Non-Medical): No  Physical Activity: Insufficiently Active (01/03/2023)   Exercise Vital Sign    Days of Exercise per Week: 1 day    Minutes of Exercise per Session: 20  min  Stress: No Stress Concern Present (01/03/2023)   [employer] of Occupational Health - Occupational Stress Questionnaire    Feeling of Stress : Not at all  Social Connections: Socially Isolated (01/03/2023)   Social Connection and Isolation Panel [NHANES]    Frequency of Communication with Friends and Family: More than three times a week    Frequency of Social Gatherings with Friends and Family: More than three times a week    Attends Religious Services: Never    Database administrator or Organizations: No    Attends Engineer, structural: Not on file    Marital Status: Divorced  Intimate Partner Violence: Not on file    Outpatient Medications Prior to Visit  Medication Sig Dispense Refill   caffeine 200 MG TABS tablet Take 200 mg by mouth daily.     Calcium-Magnesium-Vitamin D 300-20-200 MG-MG-UNIT CHEW Chew 1 capsule by mouth daily.     citalopram (CELEXA) 20 MG tablet Take 1 tablet (20 mg total) by mouth daily. 90 tablet 3   Coenzyme Q10 (COQ-10 PO) Take 1 tablet by mouth daily.     Docosahexaenoic Acid (DHA OMEGA 3 PO) Take 2 capsules by mouth 2 (two) times daily.     EPINEPHrine (EPIPEN 2-PAK) 0.3 mg/0.3 mL IJ SOAJ injection Inject 0.3 mLs (0.3 mg total) into the muscle as needed for anaphylaxis. 1 each 1   fenofibrate (TRICOR) 48 MG tablet TAKE 1 TABLET DAILY 90 tablet 0   fluticasone (FLONASE) 50 MCG/ACT nasal spray Place 2 sprays into both nostrils daily. 16 g 6   guaiFENesin (MUCINEX) 600 MG 12 hr tablet Take 2 tablets (1,200 mg total) by mouth 2 (two) times daily. 30 tablet 2   ibuprofen (ADVIL) 200 MG tablet Take 400 mg by mouth every 6 (six) hours as needed for headache or moderate pain.     lidocaine (XYLOCAINE) 2 % solution Use as directed 15 mLs in the mouth or throat every 4 (four) hours as needed for mouth pain. 100 mL 0   LUTEIN PO Take 1 capsule by mouth 2 (two) times daily.     Melatonin 10 MG TABS Take 10 mg by mouth at bedtime.     Milk Thistle 1000  MG CAPS Take 1,000 mg by mouth daily.     Multiple Vitamins-Minerals (MULTIVITAMIN PO) Take 1 tablet by mouth daily.     RABEprazole (  ACIPHEX) 20 MG tablet TAKE 1 TABLET DAILY 90 tablet 3   rosuvastatin (CRESTOR) 5 MG tablet TAKE 1 TABLET DAILY 90 tablet 3   TURMERIC PO Take 1 tablet by mouth daily.     benzonatate (TESSALON) 100 MG capsule Take 1 capsule (100 mg total) by mouth 2 (two) times daily as needed for cough. 20 capsule 0   No facility-administered medications prior to visit.    Allergies  Allergen Reactions   Fluticasone-Salmeterol     rapid heart rate, medication is inhalent    Review of Systems  Genitourinary:  Positive for dysuria.       Objective:    Physical Exam Constitutional:      General: She is not in acute distress.    Appearance: Normal appearance. She is well-developed.  HENT:     Head: Normocephalic and atraumatic.     Right Ear: Ear canal and external ear normal.     Left Ear: Tympanic membrane, ear canal and external ear normal.     Ears:     Comments: R TM is dull with clear fluid noted behind membrane No erythema, no bulging Eyes:     General: No scleral icterus. Neck:     Thyroid: No thyromegaly.  Cardiovascular:     Rate and Rhythm: Normal rate and regular rhythm.     Heart sounds: Normal heart sounds. No murmur heard. Pulmonary:     Effort: Pulmonary effort is normal. No respiratory distress.     Breath sounds: Normal breath sounds. No wheezing.  Abdominal:     Palpations: Abdomen is soft.     Tenderness: There is no abdominal tenderness. There is no right CVA tenderness or left CVA tenderness.  Musculoskeletal:     Cervical back: Neck supple.  Skin:    General: Skin is warm and dry.  Neurological:     Mental Status: She is alert and oriented to person, place, and time.  Psychiatric:        Mood and Affect: Mood normal.        Behavior: Behavior normal.        Thought Content: Thought content normal.        Judgment: Judgment  normal.      BP 110/76   Pulse (!) 110   Temp 98 F (36.7 C)   Resp 18   Ht 5\' 11"  (1.803 m)   Wt 178 lb (80.7 kg)   LMP 07/10/1996   SpO2 96%   BMI 24.83 kg/m  Wt Readings from Last 3 Encounters:  01/12/23 178 lb (80.7 kg)  01/04/23 182 lb (82.6 kg)  12/18/22 183 lb 3.2 oz (83.1 kg)       Assessment & Plan:   Problem List Items Addressed This Visit       Unprioritized   Right acute serous otitis media    Reassurance provided that this should resolve on its own in the coming weeks. She will let us know if symptoms worsen or fail to improve.       Relevant Medications   cephALEXin (KEFLEX) 500 MG capsule   Acute cystitis without hematuria    UA notes large leukocytes.  Will Rx with keflex 500mg  bid while we await culture.       Relevant Medications   cephALEXin (KEFLEX) 500 MG capsule   Other Visit Diagnoses     Dysuria    -  Primary   Relevant Orders   Urine Culture   POCT Urinalysis Dipstick (Completed)  I have discontinued Britta Mccreedy A. [facility] benzonatate. I am also having her start on cephALEXin. Additionally, I am having her maintain her Multiple Vitamins-Minerals (MULTIVITAMIN PO), Calcium-Magnesium-Vitamin D, Docosahexaenoic Acid (DHA OMEGA 3 PO), caffeine, LUTEIN PO, Melatonin, TURMERIC PO, EPINEPHrine, Coenzyme Q10 (COQ-10 PO), Milk Thistle, ibuprofen, RABEprazole, citalopram, rosuvastatin, fenofibrate, guaiFENesin, lidocaine, and fluticasone.  Meds ordered this encounter  Medications   cephALEXin (KEFLEX) 500 MG capsule    Sig: Take 1 capsule (500 mg total) by mouth 2 (two) times daily for 7 days.    Dispense:  14 capsule    Refill:  0    Order Specific Question:   Supervising Provider    Answer:   Danise Edge A [[id number]]

## 2023-01-12 NOTE — Assessment & Plan Note (Signed)
Reassurance provided that this should resolve on its own in the coming weeks. She will let us know if symptoms worsen or fail to improve.

## 2023-01-14 LAB — URINE CULTURE
MICRO NUMBER:: 15164762
SPECIMEN QUALITY:: ADEQUATE

## 2023-01-16 ENCOUNTER — Telehealth: Payer: Self-pay | Admitting: Neurology

## 2023-01-16 NOTE — Telephone Encounter (Signed)
Alia left vmail on 01/02/23 & I sent mychart message.

## 2023-02-07 ENCOUNTER — Other Ambulatory Visit: Payer: Self-pay | Admitting: Family Medicine

## 2023-02-13 ENCOUNTER — Ambulatory Visit: Payer: Medicare HMO | Admitting: Medical

## 2023-02-13 ENCOUNTER — Encounter: Payer: Self-pay | Admitting: Medical

## 2023-02-13 VITALS — BP 120/82 | HR 74 | Resp 18 | Ht 71.0 in | Wt 172.0 lb

## 2023-02-13 DIAGNOSIS — R8290 Unspecified abnormal findings in urine: Secondary | ICD-10-CM | POA: Diagnosis not present

## 2023-02-13 DIAGNOSIS — N390 Urinary tract infection, site not specified: Secondary | ICD-10-CM

## 2023-02-13 DIAGNOSIS — R300 Dysuria: Secondary | ICD-10-CM | POA: Diagnosis not present

## 2023-02-13 LAB — POCT URINALYSIS DIPSTICK
Bilirubin, UA: NEGATIVE
Blood, UA: NEGATIVE
Glucose, UA: NEGATIVE
Ketones, UA: NEGATIVE
Nitrite, UA: NEGATIVE
Protein, UA: NEGATIVE
Spec Grav, UA: <=1.005 — AB (ref 1.010–1.025)
Urobilinogen, UA: 0.2 E.U./dL
pH, UA: 7 (ref 5.0–8.0)

## 2023-02-13 MED ORDER — FLUCONAZOLE 150 MG PO TABS: 150.0000 mg | ORAL_TABLET | Freq: Every day | ORAL | 0 refills | Status: DC

## 2023-02-13 MED ORDER — CEFTRIAXONE SODIUM 500 MG IJ SOLR
500.0000 mg | Freq: Once | INTRAMUSCULAR | Status: AC
Start: 2023-02-13 — End: 2023-02-13
  Administered 2023-02-13: 500 mg via INTRAMUSCULAR

## 2023-02-13 NOTE — Progress Notes (Signed)
Subjective:    Patient ID: Emily Parrish, female    DOB: 01-Dec-1945, 77 y.o.   MRN: [id number]  HPI Pt in for follow up.   Last visit with Alma Downs 12-13-2022    Dysuria      Onset: 2 days      Dysuria        History of Present Illness         Pt presents today with concerns about a urinary tract infection.  She saw her PCP on 6/10 for UTI and was treated with Keflex.  Culture grew pan sensitive e.coli. She reports symptoms resolved following treatment.  She then went on vacation and developed COVID-19. She was seen by Hyman Hopes NP on 6/27 and was treated with paxlovid.  She reports her covid symptoms have resolved with the exception of some ear pressure and fatigue.     Today she reports a 2 day history of urinary frequency and foul urinary odor.    Pt had 2 consecutive cultures which grew out e coli.  Pt was given keflex by Melissa second round.   Pt states urologist did do procedure to open up urethra. Pt recently was self catherizing after her symptoms started to occure second time.  Pt states her symptom never cleared with second round of antibiotic. She also state early morning odor to urine. Occasional mild pain on urination but not like before. No fever, no chills or sweats. No pain over kidneys.      Review of Systems  Constitutional:  Negative for chills, fatigue and fever.  Respiratory:  Negative for cough and chest tightness.   Cardiovascular:  Negative for chest pain and palpitations.  Gastrointestinal:  Negative for abdominal pain.  Genitourinary:  Negative for dysuria and frequency.  Musculoskeletal:  Negative for back pain, myalgias and neck stiffness.    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, dispensing  -- independent  Tobacco Use   Smoking status: Former    Current packs/day: 0.00    Types: Cigarettes    Start date: 07/10/1961    Quit date: 07/11/1971    Years since quitting: 51.6   Smokeless tobacco: Never   Tobacco comments:    used to smoke--1 1/2 pack per week  Vaping Use   Vaping status: Never Used  Substance and Sexual Activity   Alcohol use: Yes    Alcohol/week: 5.0 - 6.0 standard drinks of alcohol    Types: 5 - 6 Glasses of wine per week    Comment: social   Drug use: No   Sexual activity: Not Currently    Partners: Male    Birth control/protection: Post-menopausal  Other Topics Concern   Not on  file  Social History Narrative   Lives by herself , no family in town   emergency contact Beverely Low [phone])   Drinks about 1-2 caffeine drinks a day    Social Determinants of Health   Financial Resource Strain: Low Risk  (01/03/2023)   Overall Financial Resource Strain (CARDIA)    Difficulty of Paying Living Expenses: Not hard at all  Food Insecurity: No Food Insecurity (01/03/2023)   Hunger Vital Sign    Worried About Running Out of Food in the Last Year: Never true    Ran Out of Food in the Last Year: Never true  Transportation Needs: No Transportation Needs (01/03/2023)   PRAPARE - Administrator, Civil Service (Medical): No    Lack of Transportation (Non-Medical): No  Physical Activity: Insufficiently Active (01/03/2023)   Exercise Vital Sign    Days of Exercise per Week: 1 day    Minutes of Exercise per Session: 20 min  Stress: No Stress Concern Present (01/03/2023)   [employer] of Occupational Health - Occupational Stress Questionnaire    Feeling of Stress : Not at all  Social Connections: Socially Isolated (01/03/2023)   Social Connection and Isolation Panel [NHANES]    Frequency of Communication with Friends and Family: More than three times a week    Frequency of Social Gatherings with Friends and Family: More than three times a week    Attends Religious Services: Never    Database administrator or Organizations: No    Attends Engineer, structural: Not on file    Marital Status: Divorced  Intimate Partner Violence: Not on file    Past Surgical History:  Procedure Laterality Date   APPENDECTOMY     BIOPSY THYROID  2016   Consistent w/ benign follicular nodule   DRUG INDUCED ENDOSCOPY N/A 12/15/2020   Procedure: DRUG INDUCED ENDOSCOPY;  Surgeon: Christia Reading, MD;  Location: [facility] OR;  Service: ENT;  Laterality: N/A;   ESOPHAGOGASTRODUODENOSCOPY  2005   HH   IMPLANTATION OF HYPOGLOSSAL NERVE STIMULATOR Right 06/08/2021   Procedure: IMPLANTATION OF HYPOGLOSSAL NERVE STIMULATOR;  Surgeon: Christia Reading, MD;  Location: [facility];  Service: ENT;  Laterality: Right;    Family History  Problem Relation Age of Onset   Coronary artery disease Maternal Grandfather    Breast cancer Maternal Aunt        died at age 69   Scleroderma Mother    Emphysema Father    Alcohol abuse Father    Stroke Brother    Colon cancer Neg Hx    Diabetes Neg Hx     Allergies  Allergen Reactions   Fluticasone-Salmeterol     rapid heart rate, medication is inhalent    Current Outpatient Medications on File Prior to Visit  Medication Sig Dispense Refill   caffeine 200 MG TABS tablet Take 200 mg by mouth daily.     Calcium-Magnesium-Vitamin D 300-20-200 MG-MG-UNIT CHEW Chew 1 capsule by mouth daily.     citalopram (CELEXA) 20 MG tablet Take 1 tablet (20 mg total) by mouth daily. 90 tablet 3   Coenzyme Q10 (COQ-10 PO) Take 1 tablet by mouth daily.      Docosahexaenoic Acid (DHA OMEGA 3 PO) Take 2 capsules by mouth 2 (two) times daily.     EPINEPHrine (EPIPEN 2-PAK) 0.3 mg/0.3 mL IJ SOAJ injection Inject 0.3 mLs (0.3 mg total) into the muscle as needed for anaphylaxis. 1 each 1   fenofibrate (TRICOR)  48 MG tablet TAKE 1 TABLET DAILY 90 tablet 0   fluticasone (FLONASE) 50 MCG/ACT nasal spray Place 2 sprays into both nostrils daily. 16 g 6   guaiFENesin (MUCINEX) 600 MG 12 hr tablet Take 2 tablets (1,200 mg total) by mouth 2 (two) times daily. 30 tablet 2   ibuprofen (ADVIL) 200 MG tablet Take 400 mg by mouth every 6 (six) hours as needed for headache or moderate pain.     lidocaine (XYLOCAINE) 2 % solution Use as directed 15 mLs in the mouth or throat every 4 (four) hours as needed for mouth pain. 100 mL 0   LUTEIN PO Take 1 capsule by mouth 2 (two) times daily.     Melatonin 10 MG TABS Take 10 mg by mouth at bedtime.     Milk Thistle 1000 MG CAPS Take 1,000 mg by mouth daily.     Multiple Vitamins-Minerals (MULTIVITAMIN PO) Take 1 tablet by mouth daily.     RABEprazole (ACIPHEX) 20 MG tablet TAKE 1 TABLET DAILY 90 tablet 3   rosuvastatin (CRESTOR) 5 MG tablet TAKE 1 TABLET DAILY 90 tablet 3   TURMERIC PO Take 1 tablet by mouth daily.     No current facility-administered medications on file prior to visit.    BP 120/82   Pulse 74   Resp 18   Ht 5\' 11"  (1.803 m)   Wt 172 lb (78 kg)   LMP 07/10/1996   SpO2 97%   BMI 23.99 kg/m        Objective:   Physical Exam   General- No acute distress. Pleasant patient. Neck- Full range of motion, no jvd Lungs- Clear, even and unlabored. Heart- regular rate and rhythm. Neurologic- CNII- XII grossly intact.  Abdomen- soft,nt, nd, +bs, no suprapubic tenderness.  Back- no cva pain on palpation.     Assessment & Plan:  UTI (urinary tract infection).  Dysuria and Abnormal urine odor. Ua shows small leukocyte number -persistent and recurrent infection despite 2 rounds of keflex.   -I  think at this point rocephin 500 mg IM good option while await for urine culture results. -will rx additional antibiotic on culture review. - will be putting in future urine culture that I want you to get done in 10 days. Want to verify infection resolve. -based on your urologic history if other recurrent infection then would place referral to urologist to consider low dose preventative antibiotic or may need cystocopy based on prior procedure history.   - POCT Urinalysis Dipstick - Urine Culture -Diflucan 150 mg for one day if you get yeast infection with antibiotic use.  Follow up date to with provider to be determined after culture review.

## 2023-02-13 NOTE — Patient Instructions (Signed)
   UTI (urinary tract infection).  Dysuria and Abnormal urine odor. Ua shows small leukocyte number -persistent and recurrent infection despite 2 rounds of keflex.   -I think at this point rocephin 500 mg IM good option while await for urine culture results. -will rx additional antibiotic on culture review. - will be putting in future urine culture that I want you to get done in 10 days. Want to verify infection resolve. -based on your urologic history if other recurrent infection then would place referral to urologist to consider low dose preventative antibiotic or may need cystocopy based on prior procedure history.   - POCT Urinalysis Dipstick - Urine Culture -Diflucan 150 mg for one day if you get yeast infection with antibiotic use.  Follow up date to with provider to be determined after culture review.

## 2023-02-16 ENCOUNTER — Encounter: Payer: Self-pay | Admitting: Medical

## 2023-02-16 MED ORDER — AMOXICILLIN-POT CLAVULANATE 875-125 MG PO TABS: 1.0000 | ORAL_TABLET | Freq: Two times a day (BID) | ORAL | 0 refills | Status: DC

## 2023-02-16 NOTE — Addendum Note (Signed)
Addended by: Gwenevere Abbot on: 02/16/2023 08:09 AM   Modules accepted: Orders

## 2023-02-26 ENCOUNTER — Other Ambulatory Visit: Payer: Medicare HMO

## 2023-02-26 DIAGNOSIS — N390 Urinary tract infection, site not specified: Secondary | ICD-10-CM

## 2023-02-27 LAB — URINE CULTURE
MICRO NUMBER:: 15349262
Result:: NO GROWTH
SPECIMEN QUALITY:: ADEQUATE

## 2023-04-11 DIAGNOSIS — L821 Other seborrheic keratosis: Secondary | ICD-10-CM | POA: Diagnosis not present

## 2023-04-11 DIAGNOSIS — D225 Melanocytic nevi of trunk: Secondary | ICD-10-CM | POA: Diagnosis not present

## 2023-04-11 DIAGNOSIS — L814 Other melanin hyperpigmentation: Secondary | ICD-10-CM | POA: Diagnosis not present

## 2023-05-08 ENCOUNTER — Other Ambulatory Visit: Payer: Self-pay | Admitting: Family Medicine

## 2023-05-24 DIAGNOSIS — H2513 Age-related nuclear cataract, bilateral: Secondary | ICD-10-CM | POA: Diagnosis not present

## 2023-05-24 DIAGNOSIS — H35372 Puckering of macula, left eye: Secondary | ICD-10-CM | POA: Diagnosis not present

## 2023-05-24 DIAGNOSIS — H04123 Dry eye syndrome of bilateral lacrimal glands: Secondary | ICD-10-CM | POA: Diagnosis not present

## 2023-06-20 ENCOUNTER — Ambulatory Visit: Payer: Medicare HMO | Admitting: Family Medicine

## 2023-06-20 ENCOUNTER — Encounter: Payer: Self-pay | Admitting: Family Medicine

## 2023-06-20 VITALS — BP 118/78 | HR 77 | Temp 98.0°F | Resp 16 | Ht 71.0 in | Wt 168.2 lb

## 2023-06-20 DIAGNOSIS — R0609 Other forms of dyspnea: Secondary | ICD-10-CM | POA: Diagnosis not present

## 2023-06-20 DIAGNOSIS — E782 Mixed hyperlipidemia: Secondary | ICD-10-CM

## 2023-06-20 LAB — COMPREHENSIVE METABOLIC PANEL
ALT: 152 U/L — ABNORMAL HIGH (ref 0–35)
AST: 192 U/L — ABNORMAL HIGH (ref 0–37)
Albumin: 4.2 g/dL (ref 3.5–5.2)
Alkaline Phosphatase: 53 U/L (ref 39–117)
BUN: 24 mg/dL — ABNORMAL HIGH (ref 6–23)
CO2: 28 meq/L (ref 19–32)
Calcium: 9.1 mg/dL (ref 8.4–10.5)
Chloride: 105 meq/L (ref 96–112)
Creatinine, Ser: 1.24 mg/dL — ABNORMAL HIGH (ref 0.40–1.20)
GFR: 41.55 mL/min — ABNORMAL LOW (ref >60.00)
Glucose, Bld: 95 mg/dL (ref 70–99)
Potassium: 4.7 meq/L (ref 3.5–5.1)
Sodium: 140 meq/L (ref 135–145)
Total Bilirubin: 0.9 mg/dL (ref 0.2–1.2)
Total Protein: 7.6 g/dL (ref 6.0–8.3)

## 2023-06-20 NOTE — Progress Notes (Signed)
Chief Complaint  Patient presents with   Shortness of Breath    Shortness of breath    Subjective: Patient is a 77 y.o. female here for SOB.  Pt has been getting short of breath with ADL's. This has been going on for 4-5 weeks. Seems to be getting worse. When she is at rest, she has no issues. No wt gain, wheezing, swelling, bleeding, change in PO intake. Lost 20 lbs since Feb of this year, attributes to lack of appetite. Energy is normal. Former smoker, 10 yrs, 2 packs per week.   Past Medical History:  Diagnosis Date   Anxiety    Esophageal reflux    Fatty liver     NASH--s/p liver Bx 2011, early portal fibrosis    Goiter    Hiatal hernia    OSA (obstructive sleep apnea) 12/07/2010    does not use CPAP   Urethral stricture    uretheral stricture , s/p dilatation per urology ~ 2005   Vitamin B 12 deficiency    mild    Vitamin D deficiency     Objective: BP 118/78 (BP Location: Left Arm, Patient Position: Sitting, Cuff Size: Normal)   Pulse 77   Temp 98 F (36.7 C) (Oral)   Resp 16   Ht 5\' 11"  (1.803 m)   Wt 168 lb 3.2 oz (76.3 kg)   LMP 07/10/1996   SpO2 98%   BMI 23.46 kg/m  General: Awake, appears stated age Heart: RRR, no LE edema Lungs: CTAB, no rales, wheezes or rhonchi. No accessory muscle use Mouth: MMM Psych: Age appropriate judgment and insight, normal affect and mood  Assessment and Plan: DOE (dyspnea on exertion) - Plan: CBC, Ceruloplasmin, DG Chest 2 View, EKG 12-Lead  New problem, uncertain prognosis.  Check above.  Offered empirical treatment with an inhaler which she politely declined.  Not showing signs of heart failure.  Will check a chest x-ray.  If workup is unremarkable, we will refer her to the pulmonology team for further evaluation. The patient voiced understanding and agreement to the plan.  Jilda [place], DO 06/20/23  10:57 AM

## 2023-06-20 NOTE — Addendum Note (Signed)
Addended by: Kathi Ludwig on: 06/20/2023 11:50 AM   Modules accepted: Orders

## 2023-06-20 NOTE — Patient Instructions (Addendum)
Give [place] 2-3 business days to get the results of your labs back.   Please get your X-ray done at the [facility] in [place]: [address], [place], [place] [address] [phone]  You do not need an appointment for this location. Send me a message when you are done with the X-ray so I can take a look.   Let us know if you need anything.

## 2023-06-21 ENCOUNTER — Ambulatory Visit: Payer: Medicare HMO

## 2023-06-21 ENCOUNTER — Other Ambulatory Visit: Payer: Self-pay | Admitting: Family Medicine

## 2023-06-21 DIAGNOSIS — R0609 Other forms of dyspnea: Secondary | ICD-10-CM | POA: Diagnosis not present

## 2023-06-21 LAB — CBC
HCT: 40 % (ref 36.0–46.0)
Hemoglobin: 13.6 g/dL (ref 12.0–15.0)
MCHC: 33.8 g/dL (ref 30.0–36.0)
MCV: 97.8 fL (ref 78.0–100.0)
Platelets: 140 10*3/uL — ABNORMAL LOW (ref 150.0–400.0)
RBC: 4.09 Mil/uL (ref 3.87–5.11)
RDW: 14.1 % (ref 11.5–15.5)
WBC: 3.2 10*3/uL — ABNORMAL LOW (ref 4.0–10.5)

## 2023-06-25 ENCOUNTER — Other Ambulatory Visit: Payer: Self-pay

## 2023-06-25 DIAGNOSIS — R0609 Other forms of dyspnea: Secondary | ICD-10-CM

## 2023-06-25 DIAGNOSIS — E782 Mixed hyperlipidemia: Secondary | ICD-10-CM

## 2023-06-26 ENCOUNTER — Other Ambulatory Visit: Payer: Self-pay

## 2023-06-26 MED ORDER — RABEPRAZOLE SODIUM 20 MG PO TBEC: DELAYED_RELEASE_TABLET | ORAL | 1 refills | Status: DC

## 2023-07-04 ENCOUNTER — Other Ambulatory Visit: Payer: Self-pay | Admitting: Family Medicine

## 2023-07-09 ENCOUNTER — Encounter: Payer: Self-pay | Admitting: Family Medicine

## 2023-07-17 ENCOUNTER — Ambulatory Visit: Payer: Medicare HMO | Admitting: Family Medicine

## 2023-07-17 ENCOUNTER — Other Ambulatory Visit (INDEPENDENT_AMBULATORY_CARE_PROVIDER_SITE_OTHER): Payer: Medicare HMO

## 2023-07-17 DIAGNOSIS — E782 Mixed hyperlipidemia: Secondary | ICD-10-CM | POA: Diagnosis not present

## 2023-07-17 DIAGNOSIS — R0609 Other forms of dyspnea: Secondary | ICD-10-CM | POA: Diagnosis not present

## 2023-07-17 LAB — CBC WITH DIFFERENTIAL/PLATELET
Basophils Absolute: 0 10*3/uL (ref 0.0–0.1)
Basophils Relative: 0.9 % (ref 0.0–3.0)
Eosinophils Absolute: 0.1 10*3/uL (ref 0.0–0.7)
Eosinophils Relative: 2.4 % (ref 0.0–5.0)
HCT: 40.1 % (ref 36.0–46.0)
Hemoglobin: 13.3 g/dL (ref 12.0–15.0)
Lymphocytes Relative: 57.6 % — ABNORMAL HIGH (ref 12.0–46.0)
Lymphs Abs: 2.3 10*3/uL (ref 0.7–4.0)
MCHC: 33.3 g/dL (ref 30.0–36.0)
MCV: 97.3 fL (ref 78.0–100.0)
Monocytes Absolute: 0.3 10*3/uL (ref 0.1–1.0)
Monocytes Relative: 7.4 % (ref 3.0–12.0)
Neutro Abs: 1.3 10*3/uL — ABNORMAL LOW (ref 1.4–7.7)
Neutrophils Relative %: 31.7 % — ABNORMAL LOW (ref 43.0–77.0)
Platelets: 136 10*3/uL — ABNORMAL LOW (ref 150.0–400.0)
RBC: 4.12 Mil/uL (ref 3.87–5.11)
RDW: 14.1 % (ref 11.5–15.5)
WBC: 4.1 10*3/uL (ref 4.0–10.5)

## 2023-07-17 LAB — HEPATIC FUNCTION PANEL
ALT: 120 U/L — ABNORMAL HIGH (ref 0–35)
AST: 153 U/L — ABNORMAL HIGH (ref 0–37)
Albumin: 4 g/dL (ref 3.5–5.2)
Alkaline Phosphatase: 56 U/L (ref 39–117)
Bilirubin, Direct: 0.2 mg/dL (ref 0.0–0.3)
Total Bilirubin: 0.7 mg/dL (ref 0.2–1.2)
Total Protein: 7.2 g/dL (ref 6.0–8.3)

## 2023-07-18 ENCOUNTER — Other Ambulatory Visit (INDEPENDENT_AMBULATORY_CARE_PROVIDER_SITE_OTHER): Payer: Medicare HMO

## 2023-07-18 ENCOUNTER — Encounter: Payer: Self-pay | Admitting: Family Medicine

## 2023-07-18 ENCOUNTER — Ambulatory Visit: Payer: Medicare HMO | Admitting: Family Medicine

## 2023-07-18 ENCOUNTER — Other Ambulatory Visit: Payer: Self-pay | Admitting: *Deleted

## 2023-07-18 VITALS — BP 105/72 | HR 84 | Temp 98.0°F | Resp 16 | Ht 71.0 in | Wt 167.0 lb

## 2023-07-18 DIAGNOSIS — E782 Mixed hyperlipidemia: Secondary | ICD-10-CM

## 2023-07-18 DIAGNOSIS — D709 Neutropenia, unspecified: Secondary | ICD-10-CM | POA: Diagnosis not present

## 2023-07-18 DIAGNOSIS — R748 Abnormal levels of other serum enzymes: Secondary | ICD-10-CM | POA: Diagnosis not present

## 2023-07-18 DIAGNOSIS — Z0000 Encounter for general adult medical examination without abnormal findings: Secondary | ICD-10-CM

## 2023-07-18 DIAGNOSIS — M76891 Other specified enthesopathies of right lower limb, excluding foot: Secondary | ICD-10-CM

## 2023-07-18 DIAGNOSIS — D696 Thrombocytopenia, unspecified: Secondary | ICD-10-CM | POA: Diagnosis not present

## 2023-07-18 DIAGNOSIS — R7989 Other specified abnormal findings of blood chemistry: Secondary | ICD-10-CM | POA: Diagnosis not present

## 2023-07-18 DIAGNOSIS — R0609 Other forms of dyspnea: Secondary | ICD-10-CM

## 2023-07-18 DIAGNOSIS — F411 Generalized anxiety disorder: Secondary | ICD-10-CM | POA: Diagnosis not present

## 2023-07-18 LAB — BASIC METABOLIC PANEL
BUN: 18 mg/dL (ref 6–23)
CO2: 28 meq/L (ref 19–32)
Calcium: 9.6 mg/dL (ref 8.4–10.5)
Chloride: 106 meq/L (ref 96–112)
Creatinine, Ser: 1.24 mg/dL — ABNORMAL HIGH (ref 0.40–1.20)
GFR: 41.52 mL/min — ABNORMAL LOW (ref >60.00)
Glucose, Bld: 90 mg/dL (ref 70–99)
Potassium: 4.9 meq/L (ref 3.5–5.1)
Sodium: 142 meq/L (ref 135–145)

## 2023-07-18 LAB — HEPATIC FUNCTION PANEL
ALT: 119 U/L — ABNORMAL HIGH (ref 0–35)
AST: 154 U/L — ABNORMAL HIGH (ref 0–37)
Albumin: 4 g/dL (ref 3.5–5.2)
Alkaline Phosphatase: 56 U/L (ref 39–117)
Bilirubin, Direct: 0.2 mg/dL (ref 0.0–0.3)
Total Bilirubin: 0.6 mg/dL (ref 0.2–1.2)
Total Protein: 7.3 g/dL (ref 6.0–8.3)

## 2023-07-18 LAB — LIPID PANEL
Cholesterol: 143 mg/dL (ref 0–200)
HDL: 48.7 mg/dL (ref >39.00)
LDL Cholesterol: 73 mg/dL (ref 0–99)
NonHDL: 94.04
Total CHOL/HDL Ratio: 3
Triglycerides: 105 mg/dL (ref 0.0–149.0)
VLDL: 21 mg/dL (ref 0.0–40.0)

## 2023-07-18 LAB — VITAMIN D 25 HYDROXY (VIT D DEFICIENCY, FRACTURES): VITD: 48.89 ng/mL (ref 30.00–100.00)

## 2023-07-18 NOTE — Addendum Note (Signed)
 Addended by: Thelma Barge D on: 07/18/2023 09:42 AM   Modules accepted: Orders

## 2023-07-18 NOTE — Patient Instructions (Addendum)
 Aim to do some physical exertion for 150 minutes per week. This is typically divided into 5 days per week, 30 minutes per day. The activity should be enough to get your heart rate up. Anything is better than nothing if you have time constraints.  Heat (pad or rice pillow in microwave) over affected area, 10-15 minutes twice daily.   Ice/cold pack over area for 10-15 min twice daily.  OK to take Tylenol  1000 mg (2 extra strength tabs) or 975 mg (3 regular strength tabs) every 6 hours as needed.  If you do not hear anything about your referral in the next 1-2 weeks, call our office and ask for an update.  Pepcid /famotidine  20 mg 1-2 times daily can help with reflux symptoms.   Someone will reach out to schedule your heart imaging.   Let us  know if you need anything.  Adductor Muscle Strain With Rehab Ask your health care provider which exercises are safe for you. Do exercises exactly as told by your health care provider and adjust them as directed. It is normal to feel mild stretching, pulling, tightness, or mild discomfort as you do these exercises. Stop right away if you feel sudden pain or your pain gets worse. Do not begin these exercises until told by your health care provider. Strengthening exercises These exercises build strength and endurance in your thighs. Endurance is the ability to use your muscles for a long time, even after your muscles get tired. Hip adductor isometrics    This exercise is sometimes called inner thigh squeeze. Sit on a firm chair that positions your knees at about the same height as your hips. Place a large ball, firm pillow, or rolled-up bath towel between your thighs. Squeeze your thighs together, gradually building tension. Hold for 15 seconds. Release the tension gradually. Allow your inner thigh muscles to relax completely before you start the next repetition. Repeat 7 times. Complete this exercise 3 times per week. Hip adduction    This exercise is  sometimes called side lying straight leg raises. Lie on your side so your head, shoulder, knee, and hip are in a straight line with each other. To help maintain your balance, you may put the foot of your top leg in front of the leg that is on the floor. Your left / right leg should be on the bottom. Roll your hips slightly forward so your hips are stacked directly over each other and your left / right knee is facing forward. Tense the muscles of your inner thigh and lift your bottom leg 4-6 inches (10-15 cm). Hold this position for 10 seconds. Slowly lower your leg to the starting position. Allow your muscles to relax completely before you start the next repetition. Repeat 9 times. Complete this exercise 3 times per week.  Hip extension    This exercise is sometimes called prone (on your belly) straight leg raises. Lie on your belly on a bed or a firm surface with a pillow under your hips. Tense your buttock muscles and lift your left / right thigh off the bed. Your left / right knee can be bent or straight, but do not let your back arch. Hold this position for 5 seconds. Slowly return to the starting position. Allow your muscles to relax completely before you start the next repetition. Repeat 9 times. Complete this exercise 3 times per week. Balance exercises These exercises improve or maintain your balance. Balance is important in preventing falls. Single-leg balance Stand near a railing  or by a door frame that you can hold onto as needed. Stand on your left / right foot. Keep your big toe down on the floor and try to keep your arch lifted. If this is too easy, you can stand with your eyes closed or stand on a pillow. Hold this position for 30 seconds. Repeat 2 times. Complete this exercise 3 times per week. Side lunges Stand with your feet together. Keeping one foot in place, step to the side with your other foot about 30-36 inches. Do not step so far that you feel discomfort in your  middle thigh. Push off from your stepping foot to return to the starting position. Repeat 9 times. Complete this exercise 3 times per week . Make sure you discuss any questions you have with your health care provider.  Document Revised: 10/15/2018 Document Reviewed: 03/26/2018 Elsevier Patient Education  2020 [employer].

## 2023-07-18 NOTE — Progress Notes (Signed)
 Chief Complaint  Patient presents with   Annual Exam    Annual Exam     Well Woman Emily Parrish is here for a complete physical.   Her last physical was >1 year ago.  Current diet: in general, a healthy diet. Current exercise: none lately. Weight is stable and she denies daytime fatigue. Seatbelt? Yes Advanced directive? Yes  Health Maintenance Shingrix- Yes DEXA- Had done 08/2018 Tetanus- Yes Pneumonia- Yes Hep C screen- Yes  Mixed Hyperlipidemia Patient presents for mixed hyperlipidemia follow up. Currently being treated with Crestor  5 mg/d, Tricor  48 mg/d and compliance with treatment thus far has been good. She denies myalgias. Diet/exercise as above.  No CP.  Sometimes get SOB with exertion moreso than in the past. Happens some of the time.  The patient is not known to have coexisting coronary artery disease.  GAD Doing well. Meditating and that helps a lot. Not routinely following with a therapist. Compliant Celexa  20 mg/d. No AE's. No SI or HI.   Intermittent nausea over the past 2 weeks. Happens before and after meals. Compliant w Aciphex .  No unexplained weight loss, bleeding, bowel changes, vomiting.  Patient's LFTs were found to be elevated since her initiation of Crestor  and right upper quadrant ultrasound.  Patient strained her right adductor muscle over the past few months.  No obvious injury or change in activity.  Has not been as active.  No bruising, redness, or swelling.  Past Medical History:  Diagnosis Date   Anxiety    Esophageal reflux    Fatty liver     NASH--s/p liver Bx 2011, early portal fibrosis    Goiter    Hiatal hernia    OSA (obstructive sleep apnea) 12/07/2010    does not use CPAP   Urethral stricture    uretheral stricture , s/p dilatation per urology ~ 2005   Vitamin B 12 deficiency    mild    Vitamin D  deficiency      Past Surgical History:  Procedure Laterality Date   APPENDECTOMY     BIOPSY THYROID   2016    Consistent w/ benign follicular nodule   DRUG INDUCED ENDOSCOPY N/A 12/15/2020   Procedure: DRUG INDUCED ENDOSCOPY;  Surgeon: Carlie Clark, MD;  Location: [facility] OR;  Service: ENT;  Laterality: N/A;   ESOPHAGOGASTRODUODENOSCOPY  2005   HH   IMPLANTATION OF HYPOGLOSSAL NERVE STIMULATOR Right 06/08/2021   Procedure: IMPLANTATION OF HYPOGLOSSAL NERVE STIMULATOR;  Surgeon: Carlie Clark, MD;  Location: [facility];  Service: ENT;  Laterality: Right;    Medications  Current Outpatient Medications on File Prior to Visit  Medication Sig Dispense Refill   caffeine 200 MG TABS tablet Take 200 mg by mouth daily.     Calcium -Magnesium-Vitamin D  300-20-200 MG-MG-UNIT CHEW Chew 1 capsule by mouth daily.     citalopram  (CELEXA ) 20 MG tablet TAKE 1 TABLET DAILY 90 tablet 3   Coenzyme Q10 (COQ-10 PO) Take 1 tablet by mouth daily.     Docosahexaenoic Acid (DHA OMEGA 3 PO) Take 2 capsules by mouth 2 (two) times daily.     fenofibrate  (TRICOR ) 48 MG tablet TAKE 1 TABLET DAILY 90 tablet 0   fluconazole  (DIFLUCAN ) 150 MG tablet Take 1 tablet (150 mg total) by mouth daily. 1 tablet 0   LUTEIN PO Take 1 capsule by mouth 2 (two) times daily.     Milk Thistle 1000 MG CAPS Take 1,000 mg by mouth daily.     Multiple Vitamins-Minerals (MULTIVITAMIN  PO) Take 1 tablet by mouth daily.     RABEprazole  (ACIPHEX ) 20 MG tablet TAKE 1 TABLET DAILY; 90 tablet 1   rosuvastatin  (CRESTOR ) 5 MG tablet TAKE 1 TABLET DAILY 90 tablet 3   TURMERIC PO Take 1 tablet by mouth daily.      Allergies Allergies  Allergen Reactions   Fluticasone -Salmeterol     rapid heart rate, medication is inhalent    Review of Systems: Constitutional:  no fevers Eye:  no recent significant change in vision Ears:  No changes in hearing Nose/Mouth/Throat:  no complaints of nasal congestion, no sore throat Cardiovascular: no chest pain Respiratory:  No shortness of breath Gastrointestinal:  No change in bowel habits GU:  Female:  negative for dysuria Integumentary:  no abnormal skin lesions reported Neurologic:  no headaches Endocrine:  denies unexplained weight changes  Exam BP 105/72   Pulse 84   Temp 98 F (36.7 C) (Oral)   Resp 16   Ht 5' 11 (1.803 m)   Wt 167 lb (75.8 kg)   LMP 07/10/1996   SpO2 98%   BMI 23.29 kg/m  General:  well developed, well nourished, in no apparent distress Skin:  no significant moles, warts, or growths Head:  no masses, lesions, or tenderness Eyes:  pupils equal and round, sclera anicteric without injection Ears:  canals without lesions, TMs shiny without retraction, no obvious effusion, no erythema Nose:  nares patent, mucosa normal, and no drainage Throat/Pharynx:  lips and gingiva without lesion; tongue and uvula midline; non-inflamed pharynx; no exudates or postnasal drainage Neck: neck supple without adenopathy, thyromegaly, or masses Lungs:  clear to auscultation, breath sounds equal bilaterally, no respiratory distress Cardio:  regular rate and rhythm, no bruits or LE edema Abdomen:  abdomen soft, nontender; bowel sounds normal; no masses or organomegaly Genital: Deferred Neuro:  gait normal; deep tendon reflexes normal and symmetric Psych: well oriented with normal range of affect and appropriate judgment/insight  Assessment and Plan  Well adult exam  Adductor tendinitis of right hip - Plan: Ambulatory referral to Physical Therapy  Mixed hyperlipidemia  Anxiety state  DOE (dyspnea on exertion) - Plan: ECHOCARDIOGRAM COMPLETE   Well 78 y.o. female. Counseled on diet and exercise. Mixed hyperlipidemia: Chronic, stable.  Continue Tricor  40 mg daily, hold Crestor  for the next 6 weeks and recheck labs.  Will recheck hepatic function panel and lipid panel. Anxiety: Chronic, stable.  Continue Celexa  20 mg daily. DOE: Chest x-ray unremarkable.  Lets check an echo.  Could be physical deconditioning. Refer to PT, stretches and exercises provided.  Heat, ice,  Tylenol . Other orders as above. Follow up in 1 yr. The patient voiced understanding and agreement to the plan.  Mabel [place], DO 07/18/23 10:06 AM

## 2023-07-19 ENCOUNTER — Other Ambulatory Visit: Payer: Self-pay | Admitting: Family Medicine

## 2023-07-19 DIAGNOSIS — K760 Fatty (change of) liver, not elsewhere classified: Secondary | ICD-10-CM

## 2023-07-19 LAB — PATHOLOGIST SMEAR REVIEW

## 2023-07-19 NOTE — Therapy (Signed)
 OUTPATIENT PHYSICAL THERAPY LOWER EXTREMITY EVALUATION   Patient Name: Emily Parrish MRN: [id number] DOB:[birth year], 78 y.o., female Today's Date: 07/25/2023  END OF SESSION:  PT End of Session - 07/24/23 1147     Visit Number 1    Date for PT Re-Evaluation 09/04/23    Authorization Type [employer]    Progress Note Due on Visit 6    PT Start Time 1145    PT Stop Time 1230    PT Time Calculation (min) 45 min    Activity Tolerance Patient tolerated treatment well    Behavior During Therapy [facility] for tasks assessed/performed             Past Medical History:  Diagnosis Date   Anxiety    Esophageal reflux    Fatty liver     NASH--s/p liver Bx 2011, early portal fibrosis    Goiter    Hiatal hernia    OSA (obstructive sleep apnea) 12/07/2010    does not use CPAP   Urethral stricture    uretheral stricture , s/p dilatation per urology ~ 2005   Vitamin B 12 deficiency    mild    Vitamin D  deficiency    Past Surgical History:  Procedure Laterality Date   APPENDECTOMY     BIOPSY THYROID   2016   Consistent w/ benign follicular nodule   DRUG INDUCED ENDOSCOPY N/A 12/15/2020   Procedure: DRUG INDUCED ENDOSCOPY;  Surgeon: Carlie Clark, MD;  Location: [facility] OR;  Service: ENT;  Laterality: N/A;   ESOPHAGOGASTRODUODENOSCOPY  2005   HH   IMPLANTATION OF HYPOGLOSSAL NERVE STIMULATOR Right 06/08/2021   Procedure: IMPLANTATION OF HYPOGLOSSAL NERVE STIMULATOR;  Surgeon: Carlie Clark, MD;  Location: [facility];  Service: ENT;  Laterality: Right;   Patient Active Problem List   Diagnosis Date Noted   Right acute serous otitis media 01/12/2023   Acute cystitis without hematuria 01/12/2023   Status post insertion of hypoglossal nerve stimulator 11/21/2021   Excessive daytime sleepiness 08/25/2020   Mouth dryness 08/25/2020   Intolerance of continuous positive airway pressure (CPAP) ventilation 08/25/2020   GAD (generalized anxiety disorder) 04/21/2019   Vitamin  D deficiency 04/21/2019   PCP NOTES >>> 04/13/2015   Mixed hyperlipidemia 04/08/2014   Special screening for malignant neoplasms, colon 02/10/2014   DJD (degenerative joint disease) 04/30/2012   Multinodular goiter 03/31/2012   Annual physical exam 10/17/2011   GERD (gastroesophageal reflux disease) 09/19/2011   OSA (obstructive sleep apnea) 12/07/2010   OTHER CHRONIC NONALCOHOLIC LIVER DISEASE 03/31/2010   VITAMIN B12 DEFICIENCY 02/15/2010   Leukocytopenia 02/14/2010   GERD 01/21/2009   Diaphragmatic hernia 01/18/2009   Urethral stricture 02/20/2007   Anxiety state 12/10/2006   POSTMENOPAUSAL STATUS 12/10/2006   FATIGUE, CHRONIC 12/10/2006    PCP: Frann Mabel Mt, DO   REFERRING PROVIDER: Frann Mabel Mt*  REFERRING DIAG: [id number] (ICD-10-CM) - Adductor tendinitis of right hip   THERAPY DIAG:  Pain in right hip  Stiffness of right hip, not elsewhere classified  Rationale for Evaluation and Treatment: Rehabilitation  ONSET DATE: ~ Nov 2024   SUBJECTIVE:   SUBJECTIVE STATEMENT: She strained R hip adductor a few months ago. Doesn't hurt all the time, but sometimes I move wrong and I feel the biggest pinch. Doesn't know when it is going to happen.  Does not recall any injury or movement that caused the pain.  Has hiking trip planned on May 1st in [place], will be walking 6 miles/day working up to  12 miles/day by end so needs to be in good condition for the trip.  Exercising 5 days a week to build her strength and endurance - primarily walking on treadmill.    PERTINENT HISTORY: NASH, Goiter, chronic fatigue, dyspnea on exertion  PAIN:  Are you having pain? Yes: NPRS scale: 0/10 - 6/10  Pain location: R adductor/groin Pain description: sharp Aggravating factors: bring hip out, getting into car or crossing leg Relieving factors: bring back to midline stops the pain  PRECAUTIONS: None  RED FLAGS: None   WEIGHT BEARING RESTRICTIONS: No  FALLS:  Has  patient fallen in last 6 months? No  LIVING ENVIRONMENT: Lives with: lives alone Lives in: House/apartment Stairs: No Has following equipment at home: None  OCCUPATION: retired  PLOF: Independent and Leisure: silver sneakers at [employer]  PATIENT GOALS: be ready to go on hiking trip in May  NEXT MD VISIT: none scheduled for hip  OBJECTIVE:  Note: Objective measures were completed at Evaluation unless otherwise noted.  DIAGNOSTIC FINDINGS: no relevant imaging  PATIENT SURVEYS:  LEFS 71/80  COGNITION: Overall cognitive status: Within functional limits for tasks assessed     SENSATION: WFL  EDEMA:  None noted  MUSCLE LENGTH: Hamstrings: Right 90 deg; Left 90 deg Quads: moderate tightness noted bilaterally with prone knee bend   POSTURE: No Significant postural limitations  PALPATION: No tenderness in R glutes or over R hip trochanter.  No tenderness R adductors.   LOWER EXTREMITY ROM: decreased R hip internal rotation compared to L hip   LOWER EXTREMITY MMT:  MMT Right eval Left eval  Hip flexion 4+ 4+  Hip extension 4+ 4+  Hip abduction 5 5  Hip adduction 5 5  Hip internal rotation 5 5  Hip external rotation 4+ 4+  Knee flexion 5 5  Knee extension 4+ 4+  Ankle dorsiflexion    Ankle plantarflexion     (Blank rows = not tested)  LOWER EXTREMITY SPECIAL TESTS:  Hip special tests: Belvie (FABER) test: positive , Ober's test: negative, Hip scouring test: positive , and Anterior hip impingement test: positive  on Right  FUNCTIONAL TESTS:  5 times sit to stand: 11.4 seconds without UE assist  table height 22 inches to place hips/knees at 90/90  GAIT: Distance walked: 50' Assistive device utilized: None Level of assistance: Complete Independence Comments: no device or deviation, gait WNL                                                                                                                                TREATMENT DATE:   07/24/2023  EVAL Therapeutic Exercise: to improve strength and mobility.  Demo, verbal and tactile cues throughout for technique.   Hip flexor stretch - standing demo  Supine windshield wipers- starting small, no pain, to perform in pain free ROM  Clamshell x 10 Self Care: Findings, POC, importance of walking outside as weather warms for training for  upcoming hike for hip strengthening, limiting treadmill training to no more than 45 min due to abnormal gait stride and affects on hips, and strengthening other muscle groups like glutes.    PATIENT EDUCATION:  Education details: findings, POC, initial HEP Person educated: Patient Education method: Explanation, Demonstration, Verbal cues, and Handouts Education comprehension: verbalized understanding and returned demonstration  HOME EXERCISE PROGRAM: Access Code: [email or web address] URL: [email or web address] Date: 07/25/2023 Prepared by: Almarie Sprinkles  Exercises - Standing Hip Flexor Stretch  - 1 x daily - 7 x weekly - 1 sets - 3 reps - 30 sec  hold - Supine Hip Internal and External Rotation  - 1 x daily - 7 x weekly - 2 sets - 10 reps - Clamshell  - 1 x daily - 7 x weekly - 2-3 sets - 10 reps  ASSESSMENT:  CLINICAL IMPRESSION: Patient is a 78 y.o. female who was seen today for physical therapy evaluation and treatment for R hip adductor strain.  She reports intermittent symptoms when moving R leg away from midline and sharp pinching sensation in groin.  She had positive tests for FABER, Hip Scour and anterior hip impingement, suggestive of hip impingement or labral tear. She does demonstrate some weakness in hip external rotators and significant tightness in hip flexors and R hip internal rotation as well, and would benefit from skilled physical therapy for strengthening, improve hip ROM, decrease pain, and improve overall conditioning in preparation for trip.   If she does not improve with PT would recommend imaging.  We discussed her  current workout routine which is primarily incline walking on treadmill, and need for changing routine as this is an abnormal stride and may aggravate/worsen hip symptoms.  Given initial HEP today to start working on flexibility, strength and ROM.    OBJECTIVE IMPAIRMENTS: decreased activity tolerance, decreased endurance, decreased mobility, difficulty walking, decreased ROM, decreased strength, increased fascial restrictions, impaired perceived functional ability, increased muscle spasms, impaired flexibility, and pain.   ACTIVITY LIMITATIONS: bending, squatting, transfers, and locomotion level  PARTICIPATION LIMITATIONS: driving and community activity  PERSONAL FACTORS: Time since onset of injury/illness/exacerbation and 1-2 comorbidities: NASH, Goiter, chronic fatigue, dyspnea on exertion  are also affecting patient's functional outcome.   REHAB POTENTIAL: Good  CLINICAL DECISION MAKING: Stable/uncomplicated  EVALUATION COMPLEXITY: Low   GOALS: Goals reviewed with patient? Yes  SHORT TERM GOALS: Target date: 08/08/2023   Patient will be independent with initial HEP. Baseline: given Goal status: INITIAL  2.  Patient will be confident with use of trekking poles to assist with long distance hikes for safety.  Baseline: has never used Goal status: INITIAL  LONG TERM GOALS: Target date: 09/19/2023   Patient will be independent with advanced/ongoing HEP to improve outcomes and carryover.  Baseline:  Goal status: INITIAL  2.  Patient will report at least 75% improvement in frequency/severity of R hip pain to improve QOL. Baseline: occurs ~ 1x/day Goal status: INITIAL  3.  Patient will demonstrate improved functional LE strength as demonstrated by 5/5 hip  strength. Baseline: see objective Goal status: INITIAL  4.  Patient will be able to ambulate for 1 hour without R hip pain over uneven surfaces for training for upcoming trip.  Baseline: walking on treadmill only Goal  status: INITIAL  5.  Patient will report at least 75/80 on LEFS to demonstrate improved functional ability. Baseline: 71/80 Goal status: INITIAL  PLAN:  PT FREQUENCY: 1-2x/week (patient preference to attend 1x/week)  PT DURATION: 8 weeks  PLANNED INTERVENTIONS:  97110-Therapeutic exercises, 97530- Therapeutic activity, [id number]- Neuromuscular re-education, [phone]- Self Care, [id number]- Manual therapy, [phone]- Gait training, [phone]- Orthotic Fit/training, [phone]- Electrical stimulation (manual), [phone]- Ultrasound, Patient/Family education, Balance training, Stair training, Taping, Dry Needling, Joint mobilization, Joint manipulation, Spinal manipulation, Spinal mobilization, Cryotherapy, and Moist heat  PLAN FOR NEXT SESSION: progress exercises for hip strengthening, hip mobility as tolerated, joint mobs to R hip to decrease impingement.    Almarie JINNY Sprinkles, PT, DPT 07/25/2023, 8:42 AM

## 2023-07-23 NOTE — Progress Notes (Signed)
 Add on sent last week

## 2023-07-24 ENCOUNTER — Ambulatory Visit: Payer: Medicare HMO | Attending: Family Medicine | Admitting: Physical Therapy

## 2023-07-24 ENCOUNTER — Other Ambulatory Visit: Payer: Self-pay

## 2023-07-24 ENCOUNTER — Encounter: Payer: Self-pay | Admitting: Physical Therapy

## 2023-07-24 DIAGNOSIS — M76891 Other specified enthesopathies of right lower limb, excluding foot: Secondary | ICD-10-CM | POA: Diagnosis not present

## 2023-07-24 DIAGNOSIS — M6281 Muscle weakness (generalized): Secondary | ICD-10-CM | POA: Diagnosis not present

## 2023-07-24 DIAGNOSIS — M25651 Stiffness of right hip, not elsewhere classified: Secondary | ICD-10-CM | POA: Diagnosis not present

## 2023-07-24 DIAGNOSIS — M25551 Pain in right hip: Secondary | ICD-10-CM | POA: Diagnosis not present

## 2023-07-30 ENCOUNTER — Other Ambulatory Visit: Payer: Self-pay | Admitting: Family Medicine

## 2023-08-02 ENCOUNTER — Encounter: Payer: Self-pay | Admitting: Physical Therapy

## 2023-08-02 ENCOUNTER — Ambulatory Visit: Payer: Medicare HMO | Admitting: Physical Therapy

## 2023-08-02 DIAGNOSIS — M6281 Muscle weakness (generalized): Secondary | ICD-10-CM

## 2023-08-02 DIAGNOSIS — M25651 Stiffness of right hip, not elsewhere classified: Secondary | ICD-10-CM

## 2023-08-02 DIAGNOSIS — M25551 Pain in right hip: Secondary | ICD-10-CM

## 2023-08-02 DIAGNOSIS — M76891 Other specified enthesopathies of right lower limb, excluding foot: Secondary | ICD-10-CM | POA: Diagnosis not present

## 2023-08-02 NOTE — Therapy (Signed)
OUTPATIENT PHYSICAL THERAPY LOWER EXTREMITY TREATMENT   Patient Name: MALVENIA HENSEL MRN: [id number] DOB:[birth year], 78 y.o., female Today's Date: 08/02/2023  END OF SESSION:  PT End of Session - 08/02/23 1028     Visit Number 2    Date for PT Re-Evaluation 09/19/23    Authorization Type [employer]    Progress Note Due on Visit 6    PT Start Time 1018    PT Stop Time 1102    PT Time Calculation (min) 44 min    Activity Tolerance Patient tolerated treatment well    Behavior During Therapy WFL for tasks assessed/performed             Past Medical History:  Diagnosis Date   Anxiety    Esophageal reflux    Fatty liver     NASH--s/p liver Bx 2011, early portal fibrosis    Goiter    Hiatal hernia    OSA (obstructive sleep apnea) 12/07/2010    does not use CPAP   Urethral stricture    uretheral stricture , s/p dilatation per urology ~ 2005   Vitamin B 12 deficiency    mild    Vitamin D deficiency    Past Surgical History:  Procedure Laterality Date   APPENDECTOMY     BIOPSY THYROID  2016   Consistent w/ benign follicular nodule   DRUG INDUCED ENDOSCOPY N/A 12/15/2020   Procedure: DRUG INDUCED ENDOSCOPY;  Surgeon: Christia Reading, MD;  Location: [facility] OR;  Service: ENT;  Laterality: N/A;   ESOPHAGOGASTRODUODENOSCOPY  2005   HH   IMPLANTATION OF HYPOGLOSSAL NERVE STIMULATOR Right 06/08/2021   Procedure: IMPLANTATION OF HYPOGLOSSAL NERVE STIMULATOR;  Surgeon: Christia Reading, MD;  Location: [facility];  Service: ENT;  Laterality: Right;   Patient Active Problem List   Diagnosis Date Noted   Right acute serous otitis media 01/12/2023   Acute cystitis without hematuria 01/12/2023   Status post insertion of hypoglossal nerve stimulator 11/21/2021   Excessive daytime sleepiness 08/25/2020   Mouth dryness 08/25/2020   Intolerance of continuous positive airway pressure (CPAP) ventilation 08/25/2020   GAD (generalized anxiety disorder) 04/21/2019   Vitamin  D deficiency 04/21/2019   PCP NOTES >>> 04/13/2015   Mixed hyperlipidemia 04/08/2014   Special screening for malignant neoplasms, colon 02/10/2014   DJD (degenerative joint disease) 04/30/2012   Multinodular goiter 03/31/2012   Annual physical exam 10/17/2011   GERD (gastroesophageal reflux disease) 09/19/2011   OSA (obstructive sleep apnea) 12/07/2010   OTHER CHRONIC NONALCOHOLIC LIVER DISEASE 03/31/2010   VITAMIN B12 DEFICIENCY 02/15/2010   Leukocytopenia 02/14/2010   GERD 01/21/2009   Diaphragmatic hernia 01/18/2009   Urethral stricture 02/20/2007   Anxiety state 12/10/2006   POSTMENOPAUSAL STATUS 12/10/2006   FATIGUE, CHRONIC 12/10/2006    PCP: Sharlene Dory, DO   REFERRING PROVIDER: Sharlene Dory*  REFERRING DIAG: [phone] (ICD-10-CM) - Adductor tendinitis of right hip   THERAPY DIAG:  Pain in right hip  Stiffness of right hip, not elsewhere classified  Muscle weakness (generalized)  Rationale for Evaluation and Treatment: Rehabilitation  ONSET DATE: ~ Nov 2024   SUBJECTIVE:   SUBJECTIVE STATEMENT: Reports only a couple of instances of pain, but once when doing the exercises.  Has questions about exercises today.     PERTINENT HISTORY: NASH, Goiter, chronic fatigue, dyspnea on exertion  PAIN:  Are you having pain? Yes: NPRS scale: 0/10 - 6/10  Pain location: R adductor/groin Pain description: sharp Aggravating factors: bring hip out,  getting into car or crossing leg Relieving factors: bring back to midline stops the pain  PRECAUTIONS: None  RED FLAGS: None   WEIGHT BEARING RESTRICTIONS: No  FALLS:  Has patient fallen in last 6 months? No  LIVING ENVIRONMENT: Lives with: lives alone Lives in: House/apartment Stairs: No Has following equipment at home: None  OCCUPATION: retired  PLOF: Independent and Leisure: silver sneakers at [employer]  PATIENT GOALS: be ready to go on hiking trip in May  NEXT MD VISIT: none scheduled  for hip  OBJECTIVE:  Note: Objective measures were completed at Evaluation unless otherwise noted.  DIAGNOSTIC FINDINGS: no relevant imaging  PATIENT SURVEYS:  LEFS 71/80  COGNITION: Overall cognitive status: Within functional limits for tasks assessed     SENSATION: WFL  EDEMA:  None noted  MUSCLE LENGTH: Hamstrings: Right 90 deg; Left 90 deg Quads: moderate tightness noted bilaterally with prone knee bend   POSTURE: No Significant postural limitations  PALPATION: No tenderness in R glutes or over R hip trochanter.  No tenderness R adductors.   LOWER EXTREMITY ROM: decreased R hip internal rotation compared to L hip   LOWER EXTREMITY MMT:  MMT Right eval Left eval  Hip flexion 4+ 4+  Hip extension 4+ 4+  Hip abduction 5 5  Hip adduction 5 5  Hip internal rotation 5 5  Hip external rotation 4+ 4+  Knee flexion 5 5  Knee extension 4+ 4+  Ankle dorsiflexion    Ankle plantarflexion     (Blank rows = not tested)  LOWER EXTREMITY SPECIAL TESTS:  Hip special tests: Luisa Hart (FABER) test: positive , Ober's test: negative, Hip scouring test: positive , and Anterior hip impingement test: positive  on Right  FUNCTIONAL TESTS:  5 times sit to stand: 11.4 seconds without UE assist  table height 22 inches to place hips/knees at 90/90  GAIT: Distance walked: 50' Assistive device utilized: None Level of assistance: Complete Independence Comments: no device or deviation, gait WNL                                                                                                                                TREATMENT DATE:  08/02/23 Therapeutic Exercise: to improve strength and mobility.  Demo, verbal and tactile cues throughout for technique. Gait x 1200' with walking poles  Hip windshield wipers x 10 SLR x 10 r/l Sidelying hip abduction x 10 r/l Clams x 10 r/l Prone hip extension 2 x 10 r/l Standing forward T's x 10 r/l Manual Therapy: to decrease muscle spasm and  pain and improve mobility GH distraction with belt to decrease tightness and improve hip mobility.   07/24/2023 EVAL Therapeutic Exercise: to improve strength and mobility.  Demo, verbal and tactile cues throughout for technique.   Hip flexor stretch - standing demo  Supine windshield wipers- starting small, no pain, to perform in pain free ROM  Clamshell x 10 Self Care: Findings, POC,  importance of walking outside as weather warms for training for upcoming hike for hip strengthening, limiting treadmill training to no more than 45 min due to abnormal gait stride and affects on hips, and strengthening other muscle groups like glutes.    PATIENT EDUCATION:  Education details: HEP review, update Person educated: Patient Education method: Explanation, Demonstration, Verbal cues, and Handouts Education comprehension: verbalized understanding and returned demonstration  HOME EXERCISE PROGRAM: Access Code: [email or web address] URL: [email or web address] Date: 08/02/2023 Prepared by: Harrie Foreman  Exercises - Standing Hip Flexor Stretch  - 1 x daily - 7 x weekly - 1 sets - 3 reps - 30 sec  hold - Supine Hip Internal and External Rotation  - 1 x daily - 7 x weekly - 2 sets - 10 reps - Clamshell  - 1 x daily - 7 x weekly - 2-3 sets - 10 reps - Supine Straight Leg Raises  - 1 x daily - 7 x weekly - 2 sets - 10 reps - Sidelying Hip Abduction  - 1 x daily - 7 x weekly - 2 sets - 10 reps - Prone Hip Extension  - 1 x daily - 7 x weekly - 2 sets - 10 reps - Forward T with Counter Support  - 1 x daily - 7 x weekly - 3 sets - 10 reps  ASSESSMENT:  CLINICAL IMPRESSION: Today reviewed and progressed exercises focusing on hip strengthening, also trialed trekking poles, initially had small LOB but quickly became confident with use and able to walk with reciprocal pattern and no dyspnea on exertion.  Will continue to use for warm-up.  Reported good stretch and decreased pain with R hip  mobilization.  GIABELLA JAGOW continues to demonstrate potential for improvement and would benefit from continued skilled therapy to address impairments.     OBJECTIVE IMPAIRMENTS: decreased activity tolerance, decreased endurance, decreased mobility, difficulty walking, decreased ROM, decreased strength, increased fascial restrictions, impaired perceived functional ability, increased muscle spasms, impaired flexibility, and pain.   ACTIVITY LIMITATIONS: bending, squatting, transfers, and locomotion level  PARTICIPATION LIMITATIONS: driving and community activity  PERSONAL FACTORS: Time since onset of injury/illness/exacerbation and 1-2 comorbidities: NASH, Goiter, chronic fatigue, dyspnea on exertion  are also affecting patient's functional outcome.   REHAB POTENTIAL: Good  CLINICAL DECISION MAKING: Stable/uncomplicated  EVALUATION COMPLEXITY: Low   GOALS: Goals reviewed with patient? Yes  SHORT TERM GOALS: Target date: 08/08/2023   Patient will be independent with initial HEP. Baseline: given Goal status: IN PROGRESS  2.  Patient will be confident with use of trekking poles to assist with long distance hikes for safety.  Baseline: has never used Goal status: IN PROGRESS  LONG TERM GOALS: Target date: 09/19/2023   Patient will be independent with advanced/ongoing HEP to improve outcomes and carryover.  Baseline:  Goal status: IN PROGRESS  2.  Patient will report at least 75% improvement in frequency/severity of R hip pain to improve QOL. Baseline: occurs ~ 1x/day Goal status: IN PROGRESS  3.  Patient will demonstrate improved functional LE strength as demonstrated by 5/5 hip  strength. Baseline: see objective Goal status: IN PROGRESS  4.  Patient will be able to ambulate for 1 hour without R hip pain over uneven surfaces for training for upcoming trip.  Baseline: walking on treadmill only Goal status: IN PROGRESS  5.  Patient will report at least 75/80 on LEFS to  demonstrate improved functional ability. Baseline: 71/80 Goal status: IN PROGRESS  PLAN:  PT FREQUENCY: 1-2x/week (patient preference  to attend 1x/week)  PT DURATION: 8 weeks  PLANNED INTERVENTIONS: 97110-Therapeutic exercises, 97530- Therapeutic activity, 97112- Neuromuscular re-education, 97535- Self Care, 16109- Manual therapy, [phone]- Gait training, [phone]- Orthotic Fit/training, [phone]- Electrical stimulation (manual), [phone]- Ultrasound, Patient/Family education, Balance training, Stair training, Taping, Dry Needling, Joint mobilization, Joint manipulation, Spinal manipulation, Spinal mobilization, Cryotherapy, and Moist heat  PLAN FOR NEXT SESSION: progress exercises for hip strengthening, hip mobility as tolerated, joint mobs to R hip to decrease impingement.    Jena Gauss, PT, DPT 08/02/2023, 2:41 PM

## 2023-08-06 ENCOUNTER — Encounter: Payer: Self-pay | Admitting: Family Medicine

## 2023-08-07 ENCOUNTER — Ambulatory Visit: Payer: Medicare HMO | Admitting: Physical Therapy

## 2023-08-08 ENCOUNTER — Ambulatory Visit (INDEPENDENT_AMBULATORY_CARE_PROVIDER_SITE_OTHER): Payer: Medicare HMO

## 2023-08-08 DIAGNOSIS — R0609 Other forms of dyspnea: Secondary | ICD-10-CM | POA: Diagnosis not present

## 2023-08-08 LAB — ECHOCARDIOGRAM COMPLETE
AV Vena cont: 0.34 cm
Area-P 1/2: 3.21 cm2
P 1/2 time: 763 ms
S' Lateral: 2.32 cm

## 2023-08-09 ENCOUNTER — Encounter: Payer: Self-pay | Admitting: Family Medicine

## 2023-08-14 ENCOUNTER — Encounter: Payer: Medicare HMO | Admitting: Physical Therapy

## 2023-08-20 DIAGNOSIS — K7400 Hepatic fibrosis, unspecified: Secondary | ICD-10-CM | POA: Diagnosis not present

## 2023-08-20 DIAGNOSIS — K7581 Nonalcoholic steatohepatitis (NASH): Secondary | ICD-10-CM | POA: Diagnosis not present

## 2023-08-20 DIAGNOSIS — R748 Abnormal levels of other serum enzymes: Secondary | ICD-10-CM | POA: Diagnosis not present

## 2023-08-21 ENCOUNTER — Other Ambulatory Visit: Payer: Self-pay | Admitting: Nurse Practitioner

## 2023-08-21 ENCOUNTER — Encounter: Payer: Medicare HMO | Admitting: Physical Therapy

## 2023-08-21 DIAGNOSIS — R748 Abnormal levels of other serum enzymes: Secondary | ICD-10-CM

## 2023-08-21 DIAGNOSIS — K7581 Nonalcoholic steatohepatitis (NASH): Secondary | ICD-10-CM

## 2023-08-21 DIAGNOSIS — K7400 Hepatic fibrosis, unspecified: Secondary | ICD-10-CM

## 2023-08-22 ENCOUNTER — Ambulatory Visit: Payer: Medicare HMO | Attending: Family Medicine | Admitting: Physical Therapy

## 2023-08-22 ENCOUNTER — Encounter: Payer: Self-pay | Admitting: Physical Therapy

## 2023-08-22 DIAGNOSIS — M25651 Stiffness of right hip, not elsewhere classified: Secondary | ICD-10-CM | POA: Insufficient documentation

## 2023-08-22 DIAGNOSIS — M6281 Muscle weakness (generalized): Secondary | ICD-10-CM | POA: Diagnosis not present

## 2023-08-22 DIAGNOSIS — M25551 Pain in right hip: Secondary | ICD-10-CM | POA: Insufficient documentation

## 2023-08-22 NOTE — Therapy (Addendum)
 OUTPATIENT PHYSICAL THERAPY LOWER EXTREMITY TREATMENT/Discharge Summary   Patient Name: Emily Parrish MRN: [id number] DOB:1945-08-31, 78 y.o., female Today's Date: 08/22/2023  END OF SESSION:  PT End of Session - 08/22/23 1446     Visit Number 3    Date for PT Re-Evaluation 09/19/23    Authorization Type [employer]    Progress Note Due on Visit 6    PT Start Time 1446    PT Stop Time 1530    PT Time Calculation (min) 44 min    Activity Tolerance Patient tolerated treatment well    Behavior During Therapy [facility] for tasks assessed/performed             Past Medical History:  Diagnosis Date   Anxiety    Esophageal reflux    Fatty liver     NASH--s/p liver Bx 2011, early portal fibrosis    Goiter    Hiatal hernia    OSA (obstructive sleep apnea) 12/07/2010    does not use CPAP   Urethral stricture    uretheral stricture , s/p dilatation per urology ~ 2005   Vitamin B 12 deficiency    mild    Vitamin D deficiency    Past Surgical History:  Procedure Laterality Date   APPENDECTOMY     BIOPSY THYROID  2016   Consistent w/ benign follicular nodule   DRUG INDUCED ENDOSCOPY N/A 12/15/2020   Procedure: DRUG INDUCED ENDOSCOPY;  Surgeon: Christia Reading, MD;  Location: [facility] OR;  Service: ENT;  Laterality: N/A;   ESOPHAGOGASTRODUODENOSCOPY  2005   HH   IMPLANTATION OF HYPOGLOSSAL NERVE STIMULATOR Right 06/08/2021   Procedure: IMPLANTATION OF HYPOGLOSSAL NERVE STIMULATOR;  Surgeon: Christia Reading, MD;  Location: [facility];  Service: ENT;  Laterality: Right;   Patient Active Problem List   Diagnosis Date Noted   Right acute serous otitis media 01/12/2023   Acute cystitis without hematuria 01/12/2023   Status post insertion of hypoglossal nerve stimulator 11/21/2021   Excessive daytime sleepiness 08/25/2020   Mouth dryness 08/25/2020   Intolerance of continuous positive airway pressure (CPAP) ventilation 08/25/2020   GAD (generalized anxiety disorder)  04/21/2019   Vitamin D deficiency 04/21/2019   PCP NOTES >>> 04/13/2015   Mixed hyperlipidemia 04/08/2014   Special screening for malignant neoplasms, colon 02/10/2014   DJD (degenerative joint disease) 04/30/2012   Multinodular goiter 03/31/2012   Annual physical exam 10/17/2011   GERD (gastroesophageal reflux disease) 09/19/2011   OSA (obstructive sleep apnea) 12/07/2010   OTHER CHRONIC NONALCOHOLIC LIVER DISEASE 03/31/2010   VITAMIN B12 DEFICIENCY 02/15/2010   Leukocytopenia 02/14/2010   GERD 01/21/2009   Diaphragmatic hernia 01/18/2009   Urethral stricture 02/20/2007   Anxiety state 12/10/2006   POSTMENOPAUSAL STATUS 12/10/2006   FATIGUE, CHRONIC 12/10/2006    PCP: Sharlene Dory, DO   REFERRING PROVIDER: Sharlene Dory*  REFERRING DIAG: [phone] (ICD-10-CM) - Adductor tendinitis of right hip   THERAPY DIAG:  Pain in right hip  Stiffness of right hip, not elsewhere classified  Muscle weakness (generalized)  Rationale for Evaluation and Treatment: Rehabilitation  ONSET DATE: ~ Nov 2024   SUBJECTIVE:   SUBJECTIVE STATEMENT: She's started walking outdoors 3x/week, up to 3 miles now, only gets pain now after hiking, it goes away after a bit.     PERTINENT HISTORY: NASH, Goiter, chronic fatigue, dyspnea on exertion  PAIN:  Are you having pain? Yes: NPRS scale: 0/10 - 6/10  Pain location: R adductor/groin Pain description: sharp Aggravating factors:  bring hip out, getting into car or crossing leg Relieving factors: bring back to midline stops the pain  PRECAUTIONS: None  RED FLAGS: None   WEIGHT BEARING RESTRICTIONS: No  FALLS:  Has patient fallen in last 6 months? No  LIVING ENVIRONMENT: Lives with: lives alone Lives in: House/apartment Stairs: No Has following equipment at home: None  OCCUPATION: retired  PLOF: Independent and Leisure: silver sneakers at [employer]  PATIENT GOALS: be ready to go on hiking trip in May  NEXT MD  VISIT: none scheduled for hip  OBJECTIVE:  Note: Objective measures were completed at Evaluation unless otherwise noted.  DIAGNOSTIC FINDINGS: no relevant imaging  PATIENT SURVEYS:  LEFS 71/80  COGNITION: Overall cognitive status: Within functional limits for tasks assessed     SENSATION: WFL  EDEMA:  None noted  MUSCLE LENGTH: Hamstrings: Right 90 deg; Left 90 deg Quads: moderate tightness noted bilaterally with prone knee bend   POSTURE: No Significant postural limitations  PALPATION: No tenderness in R glutes or over R hip trochanter.  No tenderness R adductors.   LOWER EXTREMITY ROM: decreased R hip internal rotation compared to L hip   LOWER EXTREMITY MMT:  MMT Right eval Left eval  Hip flexion 4+ 4+  Hip extension 4+ 4+  Hip abduction 5 5  Hip adduction 5 5  Hip internal rotation 5 5  Hip external rotation 4+ 4+  Knee flexion 5 5  Knee extension 4+ 4+  Ankle dorsiflexion    Ankle plantarflexion     (Blank rows = not tested)  LOWER EXTREMITY SPECIAL TESTS:  Hip special tests: Luisa Hart (FABER) test: positive , Ober's test: negative, Hip scouring test: positive , and Anterior hip impingement test: positive  on Right  FUNCTIONAL TESTS:  5 times sit to stand: 11.4 seconds without UE assist  table height 22 inches to place hips/knees at 90/90  GAIT: Distance walked: 50' Assistive device utilized: None Level of assistance: Complete Independence Comments: no device or deviation, gait WNL                                                                                                                                TREATMENT DATE:  08/22/2023 Therapeutic Exercise: to improve strength and mobility.  Demo, verbal and tactile cues throughout for technique. Gait x 1200' with walking poles for warm up Lunge matrix exercise -  sliding foot on towel, at counter for safety forward x 10 r/l, side x 10 r/l, backwards x 10 r/l Neuromuscular Reeducation: for balance,  coordination and proprioception Clock exercise - 4 directions In corner for safety x 5 each side SLS - without support only 8 seconds In corner for safety: -Tandem stance x 30 sec each side -SLS x 30 sec Manual Therapy: to decrease muscle spasm and pain and improve mobility Right Femoral head distraction with gentle IR/ER for hip, STM/TPR to hip flexor  08/02/23 Therapeutic Exercise: to improve strength and  mobility.  Demo, verbal and tactile cues throughout for technique. Gait x 1200' with walking poles  Hip windshield wipers x 10 SLR x 10 r/l Sidelying hip abduction x 10 r/l Clams x 10 r/l Prone hip extension 2 x 10 r/l Standing forward T's x 10 r/l Manual Therapy: to decrease muscle spasm and pain and improve mobility FA distraction with belt to decrease tightness and improve hip mobility.   07/24/2023 EVAL Therapeutic Exercise: to improve strength and mobility.  Demo, verbal and tactile cues throughout for technique.   Hip flexor stretch - standing demo  Supine windshield wipers- starting small, no pain, to perform in pain free ROM  Clamshell x 10 Self Care: Findings, POC, importance of walking outside as weather warms for training for upcoming hike for hip strengthening, limiting treadmill training to no more than 45 min due to abnormal gait stride and affects on hips, and strengthening other muscle groups like glutes.    PATIENT EDUCATION:  Education details: HEP review, update Person educated: Patient Education method: Explanation, Demonstration, Verbal cues, and Handouts Education comprehension: verbalized understanding and returned demonstration  HOME EXERCISE PROGRAM: Access Code: [email or web address] URL: [email or web address] Date: 08/22/2023 Prepared by: Harrie Foreman  Exercises - Standing Hip Flexor Stretch  - 1 x daily - 7 x weekly - 1 sets - 3 reps - 30 sec  hold - Supine Hip Internal and External Rotation  - 1 x daily - 7 x weekly - 2 sets - 10 reps -  Clamshell  - 1 x daily - 7 x weekly - 2-3 sets - 10 reps - Supine Straight Leg Raises  - 1 x daily - 7 x weekly - 2 sets - 10 reps - Sidelying Hip Abduction  - 1 x daily - 7 x weekly - 2 sets - 10 reps - Prone Hip Extension  - 1 x daily - 7 x weekly - 2 sets - 10 reps - Forward T with Counter Support  - 1 x daily - 7 x weekly - 3 sets - 10 reps - Single Leg Balance with Clock Reach  - 1 x daily - 7 x weekly - 3 sets - 10 reps - Lunge Matrix on Slider  - 1 x daily - 7 x weekly - 3 sets - 10 reps - Tandem Stance in Corner  - 1 x daily - 7 x weekly - 1 sets - 2 reps - 30 sec  hold - Single Leg Stance in Corner  - 1 x daily - 7 x weekly - 1 sets - 2 reps - 30 sec hold  ASSESSMENT:  CLINICAL IMPRESSION: Skyler reports having hip pain now only after walking for prolonged distances.  Today focused on balance, as Janylah demonstrated instability in single leg stance and tandem stance.  Also added more exercises for hip mobility and strengthening in standing position.  Manual therapy at end of session to distract and stretch hip joint.   BETTYMAE YOTT continues to demonstrate potential for improvement and would benefit from continued skilled therapy to address impairments.     OBJECTIVE IMPAIRMENTS: decreased activity tolerance, decreased endurance, decreased mobility, difficulty walking, decreased ROM, decreased strength, increased fascial restrictions, impaired perceived functional ability, increased muscle spasms, impaired flexibility, and pain.   ACTIVITY LIMITATIONS: bending, squatting, transfers, and locomotion level  PARTICIPATION LIMITATIONS: driving and community activity  PERSONAL FACTORS: Time since onset of injury/illness/exacerbation and 1-2 comorbidities: NASH, Goiter, chronic fatigue, dyspnea on exertion  are also affecting patient's functional outcome.  REHAB POTENTIAL: Good  CLINICAL DECISION MAKING: Stable/uncomplicated  EVALUATION COMPLEXITY: Low   GOALS: Goals  reviewed with patient? Yes  SHORT TERM GOALS: Target date: 08/08/2023   Patient will be independent with initial HEP. Baseline: given Goal status: MET 08/22/23  2.  Patient will be confident with use of trekking poles to assist with long distance hikes for safety.  Baseline: has never used Goal status: IN PROGRESS 08/22/23 - no LOB today  LONG TERM GOALS: Target date: 09/19/2023   Patient will be independent with advanced/ongoing HEP to improve outcomes and carryover.  Baseline:  Goal status: IN PROGRESS  2.  Patient will report at least 75% improvement in frequency/severity of R hip pain to improve QOL. Baseline: occurs ~ 1x/day Goal status: IN PROGRESS  3.  Patient will demonstrate improved functional LE strength as demonstrated by 5/5 hip  strength. Baseline: see objective Goal status: IN PROGRESS  4.  Patient will be able to ambulate for 1 hour without R hip pain over uneven surfaces for training for upcoming trip.  Baseline: walking on treadmill only Goal status: IN PROGRESS  5.  Patient will report at least 75/80 on LEFS to demonstrate improved functional ability. Baseline: 71/80 Goal status: IN PROGRESS  PLAN:  PT FREQUENCY: 1-2x/week (patient preference to attend 1x/week)  PT DURATION: 8 weeks  PLANNED INTERVENTIONS: 97110-Therapeutic exercises, 97530- Therapeutic activity, [id number]- Neuromuscular re-education, 97535- Self Care, 16109- Manual therapy, [phone]- Gait training, [phone]- Orthotic Fit/training, [phone]- Electrical stimulation (manual), [phone]- Ultrasound, Patient/Family education, Balance training, Stair training, Taping, Dry Needling, Joint mobilization, Joint manipulation, Spinal manipulation, Spinal mobilization, Cryotherapy, and Moist heat  PLAN FOR NEXT SESSION: progress exercises for hip strengthening, hip mobility as tolerated, joint mobs to R hip to decrease impingement.    Jena Gauss, PT, DPT 08/22/2023, 5:06 PM   PHYSICAL THERAPY DISCHARGE  SUMMARY  Visits from Start of Care: 3  Current functional level related to goals / functional outcomes: Decreased hip pain   Remaining deficits: Pain with prolonged walking   Education / Equipment: HEP   Plan: Patient agrees to discharge.  Patient is being discharged by request, she is happy with progess and HEP.    Jena Gauss, PT  09/26/2023 9:09 AM

## 2023-08-24 ENCOUNTER — Encounter: Payer: Self-pay | Admitting: Family Medicine

## 2023-08-24 ENCOUNTER — Other Ambulatory Visit (INDEPENDENT_AMBULATORY_CARE_PROVIDER_SITE_OTHER): Payer: Medicare HMO

## 2023-08-24 DIAGNOSIS — K760 Fatty (change of) liver, not elsewhere classified: Secondary | ICD-10-CM | POA: Diagnosis not present

## 2023-08-24 DIAGNOSIS — E782 Mixed hyperlipidemia: Secondary | ICD-10-CM | POA: Diagnosis not present

## 2023-08-24 LAB — HEPATIC FUNCTION PANEL
ALT: 77 U/L — ABNORMAL HIGH (ref 0–35)
AST: 102 U/L — ABNORMAL HIGH (ref 0–37)
Albumin: 3.9 g/dL (ref 3.5–5.2)
Alkaline Phosphatase: 48 U/L (ref 39–117)
Bilirubin, Direct: 0.1 mg/dL (ref 0.0–0.3)
Total Bilirubin: 0.5 mg/dL (ref 0.2–1.2)
Total Protein: 7.2 g/dL (ref 6.0–8.3)

## 2023-08-24 LAB — LIPID PANEL
Cholesterol: 158 mg/dL (ref 0–200)
HDL: 51.5 mg/dL (ref >39.00)
LDL Cholesterol: 88 mg/dL (ref 0–99)
NonHDL: 106.02
Total CHOL/HDL Ratio: 3
Triglycerides: 90 mg/dL (ref 0.0–149.0)
VLDL: 18 mg/dL (ref 0.0–40.0)

## 2023-08-28 ENCOUNTER — Encounter: Payer: Medicare HMO | Admitting: Physical Therapy

## 2023-08-29 ENCOUNTER — Encounter: Payer: Medicare HMO | Admitting: Physical Therapy

## 2023-08-31 ENCOUNTER — Other Ambulatory Visit: Payer: Medicare HMO

## 2023-09-03 ENCOUNTER — Ambulatory Visit
Admission: RE | Admit: 2023-09-03 | Discharge: 2023-09-03 | Disposition: A | Discharge status: Home / Self Care | Payer: Medicare HMO | Source: Ambulatory Visit | Attending: Nurse Practitioner | Admitting: Nurse Practitioner

## 2023-09-03 DIAGNOSIS — R945 Abnormal results of liver function studies: Secondary | ICD-10-CM | POA: Diagnosis not present

## 2023-09-03 DIAGNOSIS — R748 Abnormal levels of other serum enzymes: Secondary | ICD-10-CM

## 2023-09-03 DIAGNOSIS — D7389 Other diseases of spleen: Secondary | ICD-10-CM | POA: Diagnosis not present

## 2023-09-03 DIAGNOSIS — K828 Other specified diseases of gallbladder: Secondary | ICD-10-CM | POA: Diagnosis not present

## 2023-09-03 DIAGNOSIS — K7400 Hepatic fibrosis, unspecified: Secondary | ICD-10-CM

## 2023-09-03 DIAGNOSIS — K769 Liver disease, unspecified: Secondary | ICD-10-CM | POA: Diagnosis not present

## 2023-09-03 DIAGNOSIS — K7581 Nonalcoholic steatohepatitis (NASH): Secondary | ICD-10-CM

## 2023-09-04 ENCOUNTER — Encounter: Payer: Medicare HMO | Admitting: Physical Therapy

## 2023-09-05 ENCOUNTER — Ambulatory Visit: Payer: Medicare HMO | Admitting: Physical Therapy

## 2023-09-07 ENCOUNTER — Encounter: Payer: Self-pay | Admitting: Family Medicine

## 2023-09-12 ENCOUNTER — Ambulatory Visit: Payer: Medicare HMO | Admitting: Internal Medicine

## 2023-09-16 ENCOUNTER — Other Ambulatory Visit: Payer: Self-pay | Admitting: Family Medicine

## 2023-09-20 DIAGNOSIS — L600 Ingrowing nail: Secondary | ICD-10-CM | POA: Diagnosis not present

## 2023-09-21 ENCOUNTER — Ambulatory Visit: Payer: Medicare HMO | Admitting: Pulmonary Disease

## 2023-09-21 ENCOUNTER — Encounter: Payer: Self-pay | Admitting: Pulmonary Disease

## 2023-09-21 VITALS — BP 133/79 | HR 68 | Temp 98.4°F | Ht 71.5 in | Wt 165.2 lb

## 2023-09-21 DIAGNOSIS — R0609 Other forms of dyspnea: Secondary | ICD-10-CM | POA: Diagnosis not present

## 2023-09-21 NOTE — Patient Instructions (Signed)
 Nice to meet you  Your chest images are clear overall which is good news.  There is subtle signs of extra air of the lungs which could be a late consequence of smoking or a subtle sign of asthma.  To gather further information to aid in diagnosis and treatment decisions in the future I think pulmonary function test would be most helpful.  Please schedule this at your earliest convenience next available  Return to clinic in 4 to 6 weeks with Dr. Judeth Horn after pulmonary function test

## 2023-09-21 NOTE — Progress Notes (Signed)
 @Patient  ID: Emily Parrish, female    DOB: [birth year], 78 y.o.   MRN: [id number]  Chief Complaint  Patient presents with  . Consult    Referring provider: Sharlene Dory*  HPI:   78 y.o. woman who were seen for evaluation of dyspnea.  Multiple PCP notes reviewed.    There are 2 main issues on questioning that can be evaluated here in pulmonary.  She reports dyspnea with exertion.  More with activities in her home.  Household chores etc.  Is going on for few months now.  No time of day when things are better or worse.  That is a position of things better or worse.  No seasonal primidone factors she can identify to make things better or worse.  No other alleviating or exacerbating factors other than rest.  She describes chest discomfort with exertion, particularly walking.  She has upcoming trip and will [place] walking.  She has been getting back out to try to walk more.  Within the first few minutes she experiences a chest pressure or discomfort.  Radiates to her back at times.  As she continues to walk with chest pressure discomfort gradually resolves.  She is not rested this gets better with ongoing exertion.  She pushes through.  She smoked minimally for about 10 years quit 50 years ago.  Denies history of asthma.  No atopic symptoms.  No history of recurrent bronchitis or pneumonia etc.  Part of workup included chest x-ray 06/2023 on my review interpretation reveals area of linear atelectasis on the right, mild hyperinflation on the lateral, otherwise clear lungs.  She had echocardiogram 07/2023 on my review interpretation revealed mild AI, trivial MVR, normal left atrium, normal ventricular cavity function etc.  Lab work reveals no anemia.  Questionaires / Pulmonary Flowsheets:   ACT:      No data to display          MMRC:     No data to display          Epworth:      No data to display          Tests:   FENO:  No results found for:  "NITRICOXIDE"  PFT:     No data to display          WALK:      No data to display          Imaging: Personally reviewed and as per EMR and discussion this note US Abdomen Complete Result Date: 09/03/2023 : PROCEDURE: ULTRASOUND ABDOMEN COMPLETE HISTORY: Patient is a 78 y/o Female with MASH, hepatic fibrosis. Elevated liver enzymes. COMPARISON: None available. TECHNIQUE: Two-dimensional grayscale and color Doppler ultrasound of the abdomen was performed. FINDINGS: The pancreas demonstrates a normal homogenous echotexture with the tail suboptimally visualized due to overlying bowel gas. The liver demonstrates mildly heterogeneous echotexture without intrahepatic biliary dilatation. No solid masses are visualized. A complex cyst measuring 2.9 x 2.8 cm is identified within the right lobe. The main portal vein demonstrates normal hepatopedal flow. The gallbladder demonstrates a minimal amount of sludge without pericholecystic fluid or wall thickening. There are no gallstones. The common bile duct measures 0.5 cm. Negative sonographic Murphy's sign. The right kidney measures 10.1 cm in length. Renal cortical echotexture is within normal limits. There is no hydronephrosis. There are no stones. There are no cysts. The left kidney measures 11.0 cm in length. Renal cortical echotexture is within normal limits. There is no hydronephrosis. There are  no stones. There are no cysts. The spleen measures 10.9 cm in length and demonstrates a tiny calcification. There is no evidence of aneurysm within the visualized segments of the abdominal aorta. The visualized segments of the IVC are unremarkable. IMPRESSION: 1. Mildly heterogeneous hepatic echotexture, most commonly seen with steatosis. Correlation with LFT's is recommended. Complex right hepatic cyst. 2.  Minimal amount of gallbladder sludge.  No biliary dilatation. 3.  Splenic calcification. Thank you for allowing [place] to assist in the care of this patient.  Electronically Signed   By: Lestine Box M.D.   On: 09/03/2023 22:00    Lab Results: Personally reviewed CBC    Component Value Date/Time   WBC 4.1 07/17/2023 0707   RBC 4.12 07/17/2023 0707   HGB 13.3 07/17/2023 0707   HGB 12.4 11/24/2011 1507   HCT 40.1 07/17/2023 0707   HCT 35.8 11/24/2011 1507   PLT 136.0 (L) 07/17/2023 0707   PLT 127 (L) 11/24/2011 1507   MCV 97.3 07/17/2023 0707   MCV 93 11/24/2011 1507   MCH 31.6 03/06/2020 2115   MCHC 33.3 07/17/2023 0707   RDW 14.1 07/17/2023 0707   RDW 12.8 11/24/2011 1507   LYMPHSABS 2.3 07/17/2023 0707   LYMPHSABS 1.5 11/24/2011 1507   MONOABS 0.3 07/17/2023 0707   EOSABS 0.1 07/17/2023 0707   EOSABS 0.1 11/24/2011 1507   BASOSABS 0.0 07/17/2023 0707   BASOSABS 0.0 11/24/2011 1507    BMET    Component Value Date/Time   NA 142 07/18/2023 1234   K 4.9 07/18/2023 1234   CL 106 07/18/2023 1234   CO2 28 07/18/2023 1234   GLUCOSE 90 07/18/2023 1234   GLUCOSE 101 (H) 04/30/2006 1630   BUN 18 07/18/2023 1234   CREATININE 1.24 (H) 07/18/2023 1234   CALCIUM 9.6 07/18/2023 1234   GFRNONAA 30 (L) 03/06/2020 1759   GFRAA 34 (L) 03/06/2020 1759    BNP No results found for: "BNP"  ProBNP No results found for: "PROBNP"  Specialty Problems       Pulmonary Problems   Diaphragmatic hernia   Qualifier: Diagnosis of  By: Koleen Distance CMA Duncan Dull), Leisha   IMO SNOMED Dx Update Oct 2024      OSA (obstructive sleep apnea)   Apnealink 12/01/10>>AHI 9, RDI 10, SpO2 low 82%.       Allergies  Allergen Reactions  . Fluticasone-Salmeterol     rapid heart rate, medication is inhalent    Immunization History  Administered Date(s) Administered  . Fluad Quad(high Dose 65+) 04/16/2019  . Influenza Nasal 04/24/2023  . Influenza Split 04/10/2011, 06/09/2014  . Influenza Whole 04/08/2010  . Influenza, High Dose Seasonal PF 04/05/2013, 04/13/2015, 04/27/2016, 05/18/2018, 05/05/2021  . Influenza-Unspecified 04/26/2017, 04/09/2020,  05/08/2022  . Moderna Covid-19 Vaccine Bivalent Booster 48yrs & up 04/15/2021, 03/31/2022, 04/24/2023  . PFIZER(Purple Top)SARS-COV-2 Vaccination 07/22/2019, 08/11/2019, 05/05/2020, 11/17/2020  . Pneumococcal Conjugate-13 04/08/2014  . Pneumococcal Polysaccharide-23 07/09/2008, 04/27/2016  . Rsv, Bivalent, Protein Subunit Rsvpref,pf Verdis Frederickson) 03/31/2022  . Td 12/10/2006, 06/12/2017  . Zoster Recombinant(Shingrix) 09/11/2018, 12/27/2018  . Zoster, Live 12/10/2006    Past Medical History:  Diagnosis Date  . Anxiety   . Esophageal reflux   . Fatty liver     NASH--s/p liver Bx 2011, early portal fibrosis   . Goiter   . Hiatal hernia   . OSA (obstructive sleep apnea) 12/07/2010    does not use CPAP  . Urethral stricture    uretheral stricture , s/p dilatation per urology ~ 2005  .  Vitamin B 12 deficiency    mild   . Vitamin D deficiency     Tobacco History: Social History   Tobacco Use  Smoking Status Former  . Current packs/day: 0.00  . Types: Cigarettes  . Start date: 07/10/1961  . Quit date: 07/11/1971  . Years since quitting: 52.2  Smokeless Tobacco Never  Tobacco Comments   used to smoke--1 1/2 pack per week   Counseling given: Not Answered Tobacco comments: used to smoke--1 1/2 pack per week   Continue to not smoke  Outpatient Encounter Medications as of 09/21/2023  Medication Sig  . caffeine 200 MG TABS tablet Take 400 mg by mouth daily.  . Calcium-Magnesium-Vitamin D 300-20-200 MG-MG-UNIT CHEW Chew 1 capsule by mouth daily.  . citalopram (CELEXA) 20 MG tablet TAKE 1 TABLET DAILY  . Coenzyme Q10 (COQ-10 PO) Take 1 tablet by mouth daily.  . Docosahexaenoic Acid (DHA OMEGA 3 PO) Take 2 capsules by mouth 2 (two) times daily.  . fenofibrate (TRICOR) 48 MG tablet TAKE 1 TABLET DAILY  . LUTEIN PO Take 1 capsule by mouth 2 (two) times daily.  . Milk Thistle 1000 MG CAPS Take 1,000 mg by mouth daily.  . Multiple Vitamins-Minerals (MULTIVITAMIN PO) Take 1 tablet by mouth  daily.  . RABEprazole (ACIPHEX) 20 MG tablet TAKE 1 TABLET DAILY;  . TURMERIC PO Take 1 tablet by mouth daily. (Patient not taking: Reported on 09/21/2023)   No facility-administered encounter medications on file as of 09/21/2023.     Review of Systems  Review of Systems  No orthopnea or PND.  Comprehensive review of systems otherwise negative. Physical Exam  BP 133/79 (BP Location: Left Arm, Patient Position: Sitting, Cuff Size: Small)   Pulse 68   Temp 98.4 F (36.9 C) (Oral)   Ht 5' 11.5" (1.816 m)   Wt 165 lb 3.2 oz (74.9 kg)   LMP 07/10/1996   SpO2 96%   BMI 22.72 kg/m   Wt Readings from Last 5 Encounters:  09/21/23 165 lb 3.2 oz (74.9 kg)  07/18/23 167 lb (75.8 kg)  06/20/23 168 lb 3.2 oz (76.3 kg)  02/13/23 172 lb (78 kg)  01/12/23 178 lb (80.7 kg)    BMI Readings from Last 5 Encounters:  09/21/23 22.72 kg/m  07/18/23 23.29 kg/m  06/20/23 23.46 kg/m  02/13/23 23.99 kg/m  01/12/23 24.83 kg/m     Physical Exam General: Sitting in chair, no acute distress Eyes: EOMI, no icterus Neck: Supple, no JVP Pulmonary: Clear, good excursion, normal work of breathing Cardiovascular: Regular rate and rhythm, no murmur, no edema Abdomen: Nondistended MSK: No synovitis, no joint effusion Neuro: Normal gait, no weakness Psych: Normal mood, full affect  Assessment & Plan:   DOE: Household tasks supposed does mild exertion with walking.  Echocardiogram with mild aortic regurgitation and mitral regurg regurgitation, it is possible this is contributing to symptoms.  Chest x-ray clear but mild hyperinflation, either sequela of cigarette smoking in the distant past or so sign of asthma.  No anemia on the recent laboratory testing.  PFTs for further evaluation.  Suspect would benefit from a trial of inhalers, bronchodilators.  Await further information from PFTs.  Chest discomfort: With exertion starts in the front of the chest pressure radiates to the back.  Then after few  bites improves.  Sounds more musculoskeletal in nature given gradual improvement.  Certainly not consistent with ischemic heart disease.  Not really consistent with asthma if this should persist or worsen with additional  exertion.  Provided reassurance.   Return in about 6 weeks (around 11/02/2023) for f/u Dr. Judeth Horn, after PFT in 4-6 weeks.   Karren Burly, MD 09/21/2023   This appointment required 61 minutes of patient care (this includes precharting, chart review, review of results, face-to-face care, etc.).

## 2023-09-24 ENCOUNTER — Encounter: Payer: Self-pay | Admitting: Family Medicine

## 2023-09-24 ENCOUNTER — Ambulatory Visit (INDEPENDENT_AMBULATORY_CARE_PROVIDER_SITE_OTHER): Admitting: Family Medicine

## 2023-09-24 VITALS — BP 124/80 | HR 69 | Temp 97.9°F | Resp 16 | Ht 71.5 in | Wt 166.0 lb

## 2023-09-24 DIAGNOSIS — Z23 Encounter for immunization: Secondary | ICD-10-CM

## 2023-09-24 DIAGNOSIS — W540XXA Bitten by dog, initial encounter: Secondary | ICD-10-CM

## 2023-09-24 DIAGNOSIS — S51852A Open bite of left forearm, initial encounter: Secondary | ICD-10-CM | POA: Diagnosis not present

## 2023-09-24 DIAGNOSIS — S61451A Open bite of right hand, initial encounter: Secondary | ICD-10-CM | POA: Diagnosis not present

## 2023-09-24 MED ORDER — AMOXICILLIN-POT CLAVULANATE 875-125 MG PO TABS
1.0000 | ORAL_TABLET | Freq: Two times a day (BID) | ORAL | 0 refills | Status: AC
Start: 2023-09-24 — End: 2023-10-01

## 2023-09-24 MED ORDER — FLUCONAZOLE 150 MG PO TABS: ORAL_TABLET | ORAL | 0 refills | Status: DC

## 2023-09-24 NOTE — Progress Notes (Signed)
 Chief Complaint  Patient presents with   Animal Bite    Patient reports dog scratches and bites, both arms     Emily Parrish is a 78 y.o. female here for a skin complaint.  Duration: 1 week Location: R hand and L forearm Pruritic? Yes Painful? No Drainage? No Pt was bitten by a dog.  Other associated symptoms: no fevers Therapies tried thus far: none Last tetanus shot was 2018.   Past Medical History:  Diagnosis Date   Anxiety    Esophageal reflux    Fatty liver     NASH--s/p liver Bx 2011, early portal fibrosis    Goiter    Hiatal hernia    OSA (obstructive sleep apnea) 12/07/2010    does not use CPAP   Urethral stricture    uretheral stricture , s/p dilatation per urology ~ 2005   Vitamin B 12 deficiency    mild    Vitamin D deficiency     BP 124/80 (BP Location: Right Arm, Patient Position: Sitting, Cuff Size: Small)   Pulse 69   Temp 97.9 F (36.6 C) (Oral)   Resp 16   Ht 5' 11.5" (1.816 m)   Wt 166 lb (75.3 kg)   LMP 07/10/1996   SpO2 98%   BMI 22.83 kg/m  Gen: awake, alert, appearing stated age Lungs: No accessory muscle use Skin: See below. No drainage, TTP, fluctuance, excessive warmth Psych: Age appropriate judgment and insight    Dog bite, initial encounter - Plan: amoxicillin-clavulanate (AUGMENTIN) 875-125 MG tablet, fluconazole (DIFLUCAN) 150 MG tablet  Bite wound of right hand, initial encounter  Bite wound of left forearm, initial encounter  Td today. 7 d of Augmentin, Diflucan prn. Warning signs and symptoms verbalized and written down in AVS.  F/u prn. The patient voiced understanding and agreement to the plan.  Jilda Roche [place], DO 09/24/23 7:45 AM

## 2023-09-24 NOTE — Addendum Note (Signed)
 Addended by: Wilford Corner on: 09/24/2023 07:55 AM   Modules accepted: Orders

## 2023-09-24 NOTE — Patient Instructions (Signed)
When you do wash it, use only soap and water. Do not vigorously scrub. Keep the area clean and dry.   Things to look out for: increasing pain not relieved by ibuprofen/acetaminophen, fevers, spreading redness, drainage of pus, or foul odor.  Let us know if you need anything.

## 2023-09-25 ENCOUNTER — Ambulatory Visit (HOSPITAL_BASED_OUTPATIENT_CLINIC_OR_DEPARTMENT_OTHER): Admitting: Pulmonary Disease

## 2023-09-25 DIAGNOSIS — R0609 Other forms of dyspnea: Secondary | ICD-10-CM

## 2023-09-25 LAB — PULMONARY FUNCTION TEST
DL/VA % pred: 101 %
DL/VA: 3.94 ml/min/mmHg/L
DLCO cor % pred: 79 %
DLCO cor: 19.16 ml/min/mmHg
DLCO unc % pred: 80 %
DLCO unc: 19.21 ml/min/mmHg
FEF 25-75 Post: 2 L/s
FEF 25-75 Pre: 1.48 L/s
FEF2575-%Change-Post: 35 %
FEF2575-%Pred-Post: 104 %
FEF2575-%Pred-Pre: 77 %
FEV1-%Change-Post: 6 %
FEV1-%Pred-Post: 91 %
FEV1-%Pred-Pre: 86 %
FEV1-Post: 2.51 L
FEV1-Pre: 2.35 L
FEV1FVC-%Change-Post: 10 %
FEV1FVC-%Pred-Pre: 97 %
FEV6-%Change-Post: 0 %
FEV6-%Pred-Post: 90 %
FEV6-%Pred-Pre: 90 %
FEV6-Post: 3.12 L
FEV6-Pre: 3.14 L
FEV6FVC-%Change-Post: 2 %
FEV6FVC-%Pred-Post: 104 %
FEV6FVC-%Pred-Pre: 101 %
FVC-%Change-Post: -3 %
FVC-%Pred-Post: 86 %
FVC-%Pred-Pre: 89 %
FVC-Post: 3.13 L
FVC-Pre: 3.24 L
Post FEV1/FVC ratio: 80 %
Post FEV6/FVC ratio: 100 %
Pre FEV1/FVC ratio: 72 %
Pre FEV6/FVC Ratio: 97 %
RV % pred: 89 %
RV: 2.46 L
TLC % pred: 87 %
TLC: 5.42 L

## 2023-09-25 NOTE — Progress Notes (Signed)
 Full PFT Performed Today

## 2023-09-25 NOTE — Patient Instructions (Signed)
 Full PFT Performed Today

## 2023-10-04 ENCOUNTER — Ambulatory Visit: Payer: Self-pay

## 2023-10-04 NOTE — Telephone Encounter (Signed)
 Dr. Judeth Horn:  Do you want to work this patient in any sooner?  Your last OV with this patient was 09/21/2023.  Patient has return OV 11/07/2023. Thank you.

## 2023-10-04 NOTE — Telephone Encounter (Signed)
When is my next available?

## 2023-10-04 NOTE — Telephone Encounter (Signed)
 Looks like I have open slot 4/2 845 am although in error as the 9 am patient is a hospital follow up in a 15 min slot.

## 2023-10-04 NOTE — Telephone Encounter (Signed)
 Scheduled patient for 10/10/2023 at 8:45 am.  Patient notified.

## 2023-10-04 NOTE — Telephone Encounter (Signed)
 Copied from CRM [id number]. Topic: Clinical - Red Word Triage >> Oct 04, 2023  1:42 PM Konrad Dolores wrote: Red Word that prompted transfer to Nurse Triage: shortness of breath and dizziness  TRIAGE SUMMARY NOTE: Pt reporting she was recently seen on 3/14 with Dr. Judeth Horn for her SOB, chest discomfort, and dizziness, pt confirms no changes or worsening since then, confirms nothing is more intense or frequent than it's been or than was evaluated by Dr. Judeth Horn at that appt. Pt requesting that she have an earlier appt with Dr. Judeth Horn, even virtually or via phone call, to go over PFT results and determine which med is appropriate going forward. Pt reporting that her appt for this purpose has been scheduled for 4/30 but she is traveling to [place] the next day for a hiking trip and wants to have the med in her system for a couple weeks prior to the trip so she can best ensure no complications with such exertion. Pt is requesting follow-up with Dr. Judeth Horn in the next or so. Advised that nurse sending HP message to office, no available virtual appts in next couple weeks with Dr. Judeth Horn. Please advise and call pt back to inform her whether able or not to accommodate request with upcoming travels.  E2C2 Pulmonary Triage - Initial Assessment Questions "Chief Complaint (e.g., cough, sob, wheezing, fever, chills, sweat or additional symptoms) *Go to specific symptom protocol after initial questions. SOB and dizziness for a while, [facility] did PFT, nurse scheduled for appt on 4/30, want to have new med he mentioned couple weeks prior to that since leaving for hiking trip in [place] the next day and wants to have med in her system for a couple weeks prior to ensure no trouble while on trip About the same as it was when seen with Dr. Judeth Horn Nothing more with chest discomfort, SOB, and dizziness than what spoke with Dr. Judeth Horn about on 3/14 Nothing is more intense or frequent than usual Fine with phone call,  zoom meeting, whatever is convenient for him, don't think long intense meeting  Reason for Disposition  [1] MILD longstanding difficulty breathing AND [2]  SAME as normal  Answer Assessment - Initial Assessment Questions 1. RESPIRATORY STATUS: "Describe your breathing?" (e.g., wheezing, shortness of breath, unable to speak, severe coughing)      Unchanged from when saw Dr. Judeth Horn  Protocols used: Breathing Difficulty-A-AH

## 2023-10-04 NOTE — Telephone Encounter (Signed)
 Your next available appointment is 11/15/2023 at 1:15 pm.  Please advise. Thank you.

## 2023-10-04 NOTE — Telephone Encounter (Signed)
 Can you schedule 4/2 at 845 am?

## 2023-10-10 ENCOUNTER — Ambulatory Visit: Admitting: Pulmonary Disease

## 2023-10-10 ENCOUNTER — Encounter: Payer: Self-pay | Admitting: Pulmonary Disease

## 2023-10-10 VITALS — BP 104/70 | HR 67 | Ht 71.0 in | Wt 166.2 lb

## 2023-10-10 DIAGNOSIS — R0609 Other forms of dyspnea: Secondary | ICD-10-CM | POA: Diagnosis not present

## 2023-10-10 MED ORDER — ALBUTEROL SULFATE HFA 108 (90 BASE) MCG/ACT IN AERS: 2.0000 | INHALATION_SPRAY | Freq: Four times a day (QID) | RESPIRATORY_TRACT | 6 refills | Status: AC | PRN

## 2023-10-10 NOTE — Patient Instructions (Signed)
 Pulmonary function test were normal, this is great news  I am glad the symptoms are decreasing in frequency with your activity  If still possible underlying asthma, present with normal pulmonary function test  I prescribed albuterol, use 2 puffs 15 to 20 minutes prior to walking or exercise.  See if this helps at all before you leave for your trip.  Also lets see if you develop any side effects or adverse reactions to it.  Some people feel jittery or have the sensation of palpitations.  It can increase the heart rate.  If you were to experience any adverse actions, let me know we can try different inhaler that the short acting similar to the albuterol.  In addition, you can use the albuterol every 4 hours as needed for shortness of breath wheezing or cough.  Return to clinic as needed

## 2023-10-10 NOTE — Progress Notes (Signed)
 @Patient  ID: Emily Parrish, female    DOB: 1945/12/25, 78 y.o.   MRN: [id number]  Chief Complaint  Patient presents with   Follow-up    Pt states random SOB, trying to do more walking before trip    Referring provider: Sharlene Dory*  HPI:   78 y.o. woman who were seen for evaluation of dyspnea on exertion.  Returns for routine follow-up.  At last visit we discussed possible options possible asthma.  Especially given hyperinflation on chest x-ray in the past.  Pulmonary function test obtained in interim.  These are all normal, full interpretation below.  We discussed asthma still possibility.  She is increased her activity level with her walking more.  Anticipation of her trip to [place] upcoming.  With this the frequency of the sensation for episodes of shortness of breath have decreased.  We discussed multiple treatment and observation options.  Ultimately, decided on a trial of short acting bronchodilators to be used prior to exertion.  She is demonstrated before she leaves to make sure there are no adverse events etc.  As always possible that it will not be beneficial but worth a try.  HPI at initial visit: There are 2 main issues on questioning that can be evaluated here in pulmonary.  She reports dyspnea with exertion.  More with activities in her home.  Household chores etc.  Is going on for few months now.  No time of day when things are better or worse.  That is a position of things better or worse.  No seasonal primidone factors she can identify to make things better or worse.  No other alleviating or exacerbating factors other than rest.  She describes chest discomfort with exertion, particularly walking.  She has upcoming trip and will [place] walking.  She has been getting back out to try to walk more.  Within the first few minutes she experiences a chest pressure or discomfort.  Radiates to her back at times.  As she continues to walk with chest pressure discomfort  gradually resolves.  She is not rested this gets better with ongoing exertion.  She pushes through.  She smoked minimally for about 10 years quit 50 years ago.  Denies history of asthma.  No atopic symptoms.  No history of recurrent bronchitis or pneumonia etc.  Part of workup included chest x-ray 06/2023 on my review interpretation reveals area of linear atelectasis on the right, mild hyperinflation on the lateral, otherwise clear lungs.  She had echocardiogram 07/2023 on my review interpretation revealed mild AI, trivial MVR, normal left atrium, normal ventricular cavity function etc.  Lab work reveals no anemia.  Questionaires / Pulmonary Flowsheets:   ACT:      No data to display          MMRC:     No data to display          Epworth:      No data to display          Tests:   FENO:  No results found for: "NITRICOXIDE"  PFT:    Latest Ref Rng & Units 09/25/2023    1:12 PM  PFT Results  FVC-Pre L 3.24   FVC-Predicted Pre % 89   FVC-Post L 3.13   FVC-Predicted Post % 86   Pre FEV1/FVC % % 72   Post FEV1/FCV % % 80   FEV1-Pre L 2.35   FEV1-Predicted Pre % 86   FEV1-Post L 2.51   DLCO  uncorrected ml/min/mmHg 19.21   DLCO UNC% % 80   DLCO corrected ml/min/mmHg 19.16   DLCO COR %Predicted % 79   DLVA Predicted % 101   TLC L 5.42   TLC % Predicted % 87   RV % Predicted % 89   Personally reviewed and interpreted as normal spirometry, no bronchodilator response.  Lung volumes within normal limits.  DLCO within normal limits.  WALK:      No data to display          Imaging: Personally reviewed and as per EMR and discussion this note No results found.   Lab Results: Personally reviewed CBC    Component Value Date/Time   WBC 4.1 07/17/2023 0707   RBC 4.12 07/17/2023 0707   HGB 13.3 07/17/2023 0707   HGB 12.4 11/24/2011 1507   HCT 40.1 07/17/2023 0707   HCT 35.8 11/24/2011 1507   PLT 136.0 (L) 07/17/2023 0707   PLT 127 (L) 11/24/2011 1507    MCV 97.3 07/17/2023 0707   MCV 93 11/24/2011 1507   MCH 31.6 03/06/2020 2115   MCHC 33.3 07/17/2023 0707   RDW 14.1 07/17/2023 0707   RDW 12.8 11/24/2011 1507   LYMPHSABS 2.3 07/17/2023 0707   LYMPHSABS 1.5 11/24/2011 1507   MONOABS 0.3 07/17/2023 0707   EOSABS 0.1 07/17/2023 0707   EOSABS 0.1 11/24/2011 1507   BASOSABS 0.0 07/17/2023 0707   BASOSABS 0.0 11/24/2011 1507    BMET    Component Value Date/Time   NA 142 07/18/2023 1234   K 4.9 07/18/2023 1234   CL 106 07/18/2023 1234   CO2 28 07/18/2023 1234   GLUCOSE 90 07/18/2023 1234   GLUCOSE 101 (H) 04/30/2006 1630   BUN 18 07/18/2023 1234   CREATININE 1.24 (H) 07/18/2023 1234   CALCIUM 9.6 07/18/2023 1234   GFRNONAA 30 (L) 03/06/2020 1759   GFRAA 34 (L) 03/06/2020 1759    BNP No results found for: "BNP"  ProBNP No results found for: "PROBNP"  Specialty Problems       Pulmonary Problems   Diaphragmatic hernia   Qualifier: Diagnosis of  By: Koleen Distance CMA Duncan Dull), Leisha   IMO SNOMED Dx Update Oct 2024      OSA (obstructive sleep apnea)   Apnealink 12/01/10>>AHI 9, RDI 10, SpO2 low 82%.       Allergies  Allergen Reactions   Fluticasone-Salmeterol     rapid heart rate, medication is inhalent    Immunization History  Administered Date(s) Administered   Fluad Quad(high Dose 65+) 04/16/2019   Influenza Nasal 04/24/2023   Influenza Split 04/10/2011, 06/09/2014   Influenza Whole 04/08/2010   Influenza, High Dose Seasonal PF 04/05/2013, 04/13/2015, 04/27/2016, 05/18/2018, 05/05/2021   Influenza-Unspecified 04/26/2017, 04/09/2020, 05/08/2022   Moderna Covid-19 Vaccine Bivalent Booster 49yrs & up 04/15/2021, 03/31/2022, 04/24/2023   PFIZER(Purple Top)SARS-COV-2 Vaccination 07/22/2019, 08/11/2019, 05/05/2020, 11/17/2020   Pneumococcal Conjugate-13 04/08/2014   Pneumococcal Polysaccharide-23 07/09/2008, 04/27/2016   Rsv, Bivalent, Protein Subunit Rsvpref,pf Verdis Frederickson) 03/31/2022   Td 12/10/2006, 06/12/2017,  09/24/2023   Zoster Recombinant(Shingrix) 09/11/2018, 12/27/2018   Zoster, Live 12/10/2006    Past Medical History:  Diagnosis Date   Anxiety    Esophageal reflux    Fatty liver     NASH--s/p liver Bx 2011, early portal fibrosis    Goiter    Hiatal hernia    OSA (obstructive sleep apnea) 12/07/2010    does not use CPAP   Urethral stricture    uretheral stricture , s/p dilatation per urology ~  2005   Vitamin B 12 deficiency    mild    Vitamin D deficiency     Tobacco History: Social History   Tobacco Use  Smoking Status Former   Current packs/day: 0.00   Types: Cigarettes   Start date: 07/10/1961   Quit date: 07/11/1971   Years since quitting: 52.2  Smokeless Tobacco Never  Tobacco Comments   used to smoke--1 1/2 pack per week   Counseling given: Not Answered Tobacco comments: used to smoke--1 1/2 pack per week   Continue to not smoke  Outpatient Encounter Medications as of 10/10/2023  Medication Sig   albuterol (VENTOLIN HFA) 108 (90 Base) MCG/ACT inhaler Inhale 2 puffs into the lungs every 6 (six) hours as needed for wheezing or shortness of breath.   caffeine 200 MG TABS tablet Take 400 mg by mouth daily.   Calcium-Magnesium-Vitamin D 300-20-200 MG-MG-UNIT CHEW Chew 1 capsule by mouth daily.   citalopram (CELEXA) 20 MG tablet TAKE 1 TABLET DAILY   Coenzyme Q10 (COQ-10 PO) Take 1 tablet by mouth daily.   Docosahexaenoic Acid (DHA OMEGA 3 PO) Take 2 capsules by mouth 2 (two) times daily.   fenofibrate (TRICOR) 48 MG tablet TAKE 1 TABLET DAILY   fluconazole (DIFLUCAN) 150 MG tablet Take 1 tab, repeat in 72 hours if no improvement.   LUTEIN PO Take 1 capsule by mouth 2 (two) times daily.   Milk Thistle 1000 MG CAPS Take 1,000 mg by mouth daily.   Multiple Vitamins-Minerals (MULTIVITAMIN PO) Take 1 tablet by mouth daily.   RABEprazole (ACIPHEX) 20 MG tablet TAKE 1 TABLET DAILY;   TURMERIC PO Take 1 tablet by mouth daily.   No facility-administered encounter medications  on file as of 10/10/2023.     Review of Systems  Review of Systems  N/a Physical Exam  BP 104/70 (BP Location: Left Arm, Patient Position: Sitting, Cuff Size: Normal)   Pulse 67   Ht 5\' 11"  (1.803 m)   Wt 166 lb 3.2 oz (75.4 kg)   LMP 07/10/1996   SpO2 100%   BMI 23.18 kg/m   Wt Readings from Last 5 Encounters:  10/10/23 166 lb 3.2 oz (75.4 kg)  09/25/23 166 lb 6.4 oz (75.5 kg)  09/24/23 166 lb (75.3 kg)  09/21/23 165 lb 3.2 oz (74.9 kg)  07/18/23 167 lb (75.8 kg)    BMI Readings from Last 5 Encounters:  10/10/23 23.18 kg/m  09/25/23 22.88 kg/m  09/24/23 22.83 kg/m  09/21/23 22.72 kg/m  07/18/23 23.29 kg/m     Physical Exam General: Sitting in chair, no acute distress Eyes: EOMI, no icterus Neck: Supple, no JVP Pulmonary: Clear, good excursion, normal work of breathing Cardiovascular: Regular rate and rhythm, no murmur, no edema Abdomen: Nondistended MSK: No synovitis, no joint effusion Neuro: Normal gait, no weakness Psych: Normal mood, full affect  Assessment & Plan:   DOE: Household tasks supposed does mild exertion with walking.  Echocardiogram with mild aortic regurgitation and mitral regurg regurgitation, it is possible this is contributing to symptoms.  Chest x-ray clear but mild hyperinflation, either sequela of cigarette smoking in the distant past or so sign of asthma.  No anemia on the recent laboratory testing.  PFTs obtained 09/2023 are all normal.  Some improvement with increase in activity level.  Encouraged to continue increase lactulose to build up cardiovascular stamina.  Trial of albuterol to see if improves exercise tolerance.  If it does would continue indefinitely as needed.  Could consider addition of  ICS/LABA therapy, maintenance inhaler if symptoms were to persist or worsen the future.   Return if symptoms worsen or fail to improve, for f/u Dr. Judeth Horn.   Karren Burly, MD 10/10/2023

## 2023-10-28 ENCOUNTER — Other Ambulatory Visit: Payer: Self-pay | Admitting: Family Medicine

## 2023-11-06 ENCOUNTER — Ambulatory Visit: Admitting: Pulmonary Disease

## 2023-12-11 ENCOUNTER — Telehealth: Payer: Self-pay | Admitting: Neurology

## 2023-12-11 NOTE — Telephone Encounter (Signed)
 Pt said went a trip on 11/08/23 and left device to turn off inspire on 11/10/23. Device was found and mailed back to her.   She was last seen 08/31/22. has inspire and device used to turn off with is not working. She is asking to schedule an appt. Does scheduling inspire patient done with you all? Would like a call back.

## 2023-12-13 NOTE — Telephone Encounter (Signed)
 Spoke with patient and she was able to get with [facility] patient support. They helped her figure out what was going on with her remote. They did recommend that pt follow up with MD to make sure device was working properly. I have scheduled patient for Monday, July 7 at 9:30am. Messaged Jaye Mettle rep to make sure someone was available to join this appt too.

## 2023-12-15 ENCOUNTER — Other Ambulatory Visit: Payer: Self-pay | Admitting: Internal Medicine

## 2023-12-31 ENCOUNTER — Encounter: Payer: Self-pay | Admitting: Family Medicine

## 2023-12-31 ENCOUNTER — Other Ambulatory Visit: Payer: Self-pay

## 2023-12-31 MED ORDER — FENOFIBRATE 48 MG PO TABS: 48.0000 mg | ORAL_TABLET | Freq: Every day | ORAL | 0 refills | Status: DC

## 2024-01-01 ENCOUNTER — Other Ambulatory Visit: Payer: Self-pay

## 2024-01-01 MED ORDER — FENOFIBRATE 48 MG PO TABS: 48.0000 mg | ORAL_TABLET | Freq: Every day | ORAL | 0 refills | Status: DC

## 2024-01-07 ENCOUNTER — Telehealth: Payer: Self-pay | Admitting: Neurology

## 2024-01-07 NOTE — Telephone Encounter (Signed)
 Request to cx appointment, not needed according to pt

## 2024-01-14 ENCOUNTER — Ambulatory Visit: Admitting: Neurology

## 2024-01-26 ENCOUNTER — Other Ambulatory Visit: Payer: Self-pay | Admitting: Family Medicine

## 2024-01-31 ENCOUNTER — Other Ambulatory Visit: Payer: Self-pay | Admitting: Medical Genetics

## 2024-02-11 ENCOUNTER — Other Ambulatory Visit (HOSPITAL_COMMUNITY)
Admission: RE | Admit: 2024-02-11 | Discharge: 2024-02-11 | Disposition: A | Discharge status: Home / Self Care | Payer: Self-pay | Source: Ambulatory Visit | Attending: Medical Genetics | Admitting: Medical Genetics

## 2024-02-20 DIAGNOSIS — R748 Abnormal levels of other serum enzymes: Secondary | ICD-10-CM | POA: Diagnosis not present

## 2024-02-20 DIAGNOSIS — K7400 Hepatic fibrosis, unspecified: Secondary | ICD-10-CM | POA: Diagnosis not present

## 2024-02-20 DIAGNOSIS — R768 Other specified abnormal immunological findings in serum: Secondary | ICD-10-CM | POA: Diagnosis not present

## 2024-02-20 DIAGNOSIS — K7401 Hepatic fibrosis, early fibrosis: Secondary | ICD-10-CM | POA: Diagnosis not present

## 2024-02-20 DIAGNOSIS — K7581 Nonalcoholic steatohepatitis (NASH): Secondary | ICD-10-CM | POA: Diagnosis not present

## 2024-02-22 LAB — GENECONNECT MOLECULAR SCREEN: Genetic Analysis Overall Interpretation: NEGATIVE

## 2024-02-27 DIAGNOSIS — K7581 Nonalcoholic steatohepatitis (NASH): Secondary | ICD-10-CM | POA: Diagnosis not present

## 2024-02-27 DIAGNOSIS — K7401 Hepatic fibrosis, early fibrosis: Secondary | ICD-10-CM | POA: Diagnosis not present

## 2024-03-19 ENCOUNTER — Other Ambulatory Visit: Payer: Self-pay | Admitting: Internal Medicine

## 2024-03-25 ENCOUNTER — Telehealth: Payer: Self-pay | Admitting: Family Medicine

## 2024-03-25 NOTE — Telephone Encounter (Signed)
 Copied from CRM [id number]. Topic: Medicare AWV >> Mar 25, 2024  9:14 AM Nathanel DEL wrote: Reason for CRM: Patient will call back to schedule AWV 03/25/2024  Nathanel Paschal; Care Guide Ambulatory Clinical Support [place] [facility] Direct Dial: [phone]

## 2024-03-26 ENCOUNTER — Telehealth: Payer: Self-pay | Admitting: Family Medicine

## 2024-03-26 NOTE — Telephone Encounter (Signed)
 Copied from CRM [id number]. Topic: Medicare AWV >> Mar 26, 2024 11:24 AM Nathanel DEL wrote: Reason for CRM: Called LVM 03/26/2024 to schedule AWV. Please schedule Virtual or Telehealth visits ONLY.   Nathanel Paschal; Care Guide Ambulatory Clinical Support [place] l [facility] Direct Dial: [phone]

## 2024-04-06 ENCOUNTER — Encounter: Payer: Self-pay | Admitting: Family Medicine

## 2024-04-08 ENCOUNTER — Telehealth: Payer: Self-pay

## 2024-04-08 NOTE — Telephone Encounter (Signed)
 Initial Comment Caller states she wanted to talk to her doctor about cognitive issues at her next appointment.She states she would like to know what to look out for she is having some memory loss. Translation No Nurse Assessment Nurse: Terris Brain, RN, [place] Date/Time (Eastern Time): 04/07/2024 4:07:15 PM Confirm and document reason for call. If symptomatic, describe symptoms. ---Caller states that when she has been going to places that she would normally know how to get to she is forgetting. She states that she hasn't gotten lost, but is getting more forgetful. Denies any new medications. Does the patient have any new or worsening symptoms? ---Yes Will a triage be completed? ---Yes Related visit to physician within the last 2 weeks? ---No Does the PT have any chronic conditions? (i.e. diabetes, asthma, this includes High risk factors for pregnancy, etc.) ---No Is this a behavioral health or substance abuse call? ---No Guidelines Guideline Title Affirmed Question Affirmed Notes Nurse Date/Time (Eastern Time) Dementia Symptoms and Questions [1] Confusion getting worse AND [2] slow onset (days to weeks) Terris Brain, RN, [place] 04/07/2024 4:11:28 PM Disp. Time Titus Time) Disposition Final User 04/07/2024 4:13:38 PM See PCP within 24 Hours Yes Terris Brain, RN, [place] Final Disposition 04/07/2024 4:13:38 PM See PCP within 24 Hours Yes Terris Brain, RN, [place] PLEASE NOTE: All timestamps contained within this report are represented as [place] Standard Time. CONFIDENTIALTY NOTICE: This fax transmission is intended only for the addressee. It contains information that is legally privileged, confidential or otherwise protected from use or disclosure. If you are not the intended recipient, you are strictly prohibited from reviewing, disclosing, copying using or disseminating any of this information or taking any action in reliance on or regarding this  information. If you have received this fax in error, please notify us  immediately by telephone so that we can arrange for its return to us . Phone: [phone], Toll-Free: [phone], Fax: [phone] BARBARA_ROSSETTI 01-Sep-1945 Page: 1 of2 CallId: [id number] Caller Disagree/Comply Comply Caller Understands Yes PreDisposition Call Doctor Care Advice Given Per Guideline SEE PCP WITHIN 24 HOURS: * IF OFFICE WILL BE OPEN: You need to be examined within the next 24 hours. Call your doctor (or NP/PA) when the office opens and make an appointment. BRING MEDICINES: * Please bring a list of your current medicines when you go to see the doctor. * It is also a good idea to bring the pill bottles too. This will help the doctor to make certain you are taking the right medicines and the right dose. ANOTHER ADULT SHOULD DRIVE: * It is better and safer if another adult drives instead of you. CALL BACK IF: * Symptoms become worse CARE ADVICE given per Dementia Symptoms and Questions (Adult) guideline. Comments User: Bynum, Sostarich [place], RN Date/Time Titus Time): 04/07/2024 4:10:50 PM Occasional etoh use- admit to drinking non-alcoholic wine now with an occasional drink since she felt she was drinking too much Referrals REFERRED TO PCP OFFICE

## 2024-04-08 NOTE — Telephone Encounter (Signed)
 Called pt. appt scheduled.

## 2024-04-11 ENCOUNTER — Encounter: Payer: Self-pay | Admitting: Family Medicine

## 2024-04-11 ENCOUNTER — Ambulatory Visit (INDEPENDENT_AMBULATORY_CARE_PROVIDER_SITE_OTHER): Admitting: Family Medicine

## 2024-04-11 VITALS — BP 110/72 | HR 100 | Temp 95.7°F | Resp 16 | Ht 71.0 in | Wt 163.2 lb

## 2024-04-11 DIAGNOSIS — R413 Other amnesia: Secondary | ICD-10-CM | POA: Diagnosis not present

## 2024-04-11 NOTE — Progress Notes (Signed)
 Chief Complaint  Patient presents with   Memory Loss    Memory Loss    Subjective: Patient is a 78 y.o. female here for memory issues.  Has had issues remembering where she is or how to get somewhere (where she has been many times before) over the past 4-6 weeks. Seems it is getting worse. She sleeps well overall, getting around 7 hrs of sleep nightly. She has the [facility] for OSA. Mood is good. Eats a good diet. She walks for exercise. No behavior changes.   Past Medical History:  Diagnosis Date   Anxiety    Esophageal reflux    Fatty liver     NASH--s/p liver Bx 2011, early portal fibrosis    Goiter    Hiatal hernia    OSA (obstructive sleep apnea) 12/07/2010    does not use CPAP   Urethral stricture    uretheral stricture , s/p dilatation per urology ~ 2005   Vitamin B 12 deficiency    mild    Vitamin D  deficiency     Objective: BP 110/72 (BP Location: Left Arm, Patient Position: Sitting)   Pulse 100   Temp (!) 95.7 F (35.4 C) (Temporal)   Resp 16   Ht 5' 11 (1.803 m)   Wt 163 lb 3.2 oz (74 kg)   LMP 07/10/1996   SpO2 95%   BMI 22.76 kg/m  General: Awake, appears stated age Heart: RRR, no LE edema Lungs: CTAB, no rales, wheezes or rhonchi. No accessory muscle use Psych: Age appropriate judgment and insight, normal affect and mood  Assessment and Plan: Memory changes - Plan: Ambulatory referral to Neuropsychology, B12, CBC, Comprehensive metabolic panel with GFR, TSH  Will check the above labs.  Refer to neuropsych for formal evaluation.  If this takes a too long logistically, we will refer to neurology.  If labs are normal, will order MRI brain without contrast.  Counseled on diet and exercise. The patient voiced understanding and agreement to the plan.  Mabel [place], DO 04/11/24  2:56 PM

## 2024-04-11 NOTE — Patient Instructions (Addendum)
 Aim to do some physical exertion for 150 minutes per week. This is typically divided into 5 days per week, 30 minutes per day. The activity should be enough to get your heart rate up. Anything is better than nothing if you have time constraints.  Give us  2-3 business days to get the results of your labs back.   If labs are normal, we will proceed with a scan of your brain.  If you do not hear anything about your referral in the next 1-2 weeks, call our office and ask for an update.  Let us  know if you need anything.

## 2024-04-12 ENCOUNTER — Other Ambulatory Visit: Payer: Self-pay | Admitting: Family Medicine

## 2024-04-12 ENCOUNTER — Ambulatory Visit: Payer: Self-pay | Admitting: Family Medicine

## 2024-04-12 DIAGNOSIS — R413 Other amnesia: Secondary | ICD-10-CM

## 2024-04-12 LAB — COMPREHENSIVE METABOLIC PANEL WITH GFR
AG Ratio: 1.1 (calc) (ref 1.0–2.5)
ALT: 67 U/L — ABNORMAL HIGH (ref 6–29)
AST: 95 U/L — ABNORMAL HIGH (ref 10–35)
Albumin: 4 g/dL (ref 3.6–5.1)
Alkaline phosphatase (APISO): 51 U/L (ref 37–153)
BUN/Creatinine Ratio: 21 (calc) (ref 6–22)
BUN: 30 mg/dL — ABNORMAL HIGH (ref 7–25)
CO2: 24 mmol/L (ref 20–32)
Calcium: 9.4 mg/dL (ref 8.6–10.4)
Chloride: 104 mmol/L (ref 98–110)
Creat: 1.42 mg/dL — ABNORMAL HIGH (ref 0.60–1.00)
Globulin: 3.5 g/dL (ref 1.9–3.7)
Glucose, Bld: 83 mg/dL (ref 65–99)
Potassium: 4.2 mmol/L (ref 3.5–5.3)
Sodium: 138 mmol/L (ref 135–146)
Total Bilirubin: 0.5 mg/dL (ref 0.2–1.2)
Total Protein: 7.5 g/dL (ref 6.1–8.1)
eGFR: 38 mL/min/1.73m2 — ABNORMAL LOW (ref >=60)

## 2024-04-12 LAB — CBC
HCT: 42.2 % (ref 35.0–45.0)
Hemoglobin: 13.5 g/dL (ref 11.7–15.5)
MCH: 31.8 pg (ref 27.0–33.0)
MCHC: 32 g/dL (ref 32.0–36.0)
MCV: 99.5 fL (ref 80.0–100.0)
MPV: 11.5 fL (ref 7.5–12.5)
Platelets: 150 Thousand/uL (ref 140–400)
RBC: 4.24 Million/uL (ref 3.80–5.10)
RDW: 12.5 % (ref 11.0–15.0)
WBC: 3.5 Thousand/uL — ABNORMAL LOW (ref 3.8–10.8)

## 2024-04-12 LAB — VITAMIN B12: Vitamin B-12: 623 pg/mL (ref 200–1100)

## 2024-04-12 LAB — TSH: TSH: 2.81 m[IU]/L (ref 0.40–4.50)

## 2024-04-15 DIAGNOSIS — L821 Other seborrheic keratosis: Secondary | ICD-10-CM | POA: Diagnosis not present

## 2024-04-15 DIAGNOSIS — L570 Actinic keratosis: Secondary | ICD-10-CM | POA: Diagnosis not present

## 2024-04-15 DIAGNOSIS — L814 Other melanin hyperpigmentation: Secondary | ICD-10-CM | POA: Diagnosis not present

## 2024-04-15 DIAGNOSIS — Z008 Encounter for other general examination: Secondary | ICD-10-CM | POA: Diagnosis not present

## 2024-04-15 DIAGNOSIS — D225 Melanocytic nevi of trunk: Secondary | ICD-10-CM | POA: Diagnosis not present

## 2024-04-16 ENCOUNTER — Encounter: Payer: Self-pay | Admitting: Family Medicine

## 2024-04-22 ENCOUNTER — Ambulatory Visit (HOSPITAL_BASED_OUTPATIENT_CLINIC_OR_DEPARTMENT_OTHER)

## 2024-04-24 DIAGNOSIS — R413 Other amnesia: Secondary | ICD-10-CM | POA: Diagnosis not present

## 2024-04-25 ENCOUNTER — Other Ambulatory Visit: Payer: Self-pay | Admitting: Family Medicine

## 2024-05-11 ENCOUNTER — Ambulatory Visit (HOSPITAL_BASED_OUTPATIENT_CLINIC_OR_DEPARTMENT_OTHER)

## 2024-05-14 DIAGNOSIS — R413 Other amnesia: Secondary | ICD-10-CM | POA: Diagnosis not present

## 2024-05-20 ENCOUNTER — Encounter (HOSPITAL_BASED_OUTPATIENT_CLINIC_OR_DEPARTMENT_OTHER): Payer: Self-pay

## 2024-05-20 ENCOUNTER — Ambulatory Visit (HOSPITAL_BASED_OUTPATIENT_CLINIC_OR_DEPARTMENT_OTHER)
Admission: RE | Admit: 2024-05-20 | Discharge: 2024-05-20 | Disposition: A | Discharge status: Home / Self Care | Source: Ambulatory Visit | Attending: Family Medicine | Admitting: Family Medicine

## 2024-05-20 DIAGNOSIS — R413 Other amnesia: Secondary | ICD-10-CM

## 2024-05-25 ENCOUNTER — Encounter: Payer: Self-pay | Admitting: Family Medicine

## 2024-06-11 DIAGNOSIS — H35372 Puckering of macula, left eye: Secondary | ICD-10-CM | POA: Diagnosis not present

## 2024-06-11 DIAGNOSIS — H2513 Age-related nuclear cataract, bilateral: Secondary | ICD-10-CM | POA: Diagnosis not present

## 2024-06-11 DIAGNOSIS — Z9889 Other specified postprocedural states: Secondary | ICD-10-CM | POA: Diagnosis not present

## 2024-06-11 DIAGNOSIS — H04123 Dry eye syndrome of bilateral lacrimal glands: Secondary | ICD-10-CM | POA: Diagnosis not present

## 2024-06-22 ENCOUNTER — Encounter: Payer: Self-pay | Admitting: Family Medicine

## 2024-06-23 ENCOUNTER — Other Ambulatory Visit: Payer: Self-pay

## 2024-06-23 DIAGNOSIS — M79606 Pain in leg, unspecified: Secondary | ICD-10-CM

## 2024-06-24 ENCOUNTER — Other Ambulatory Visit: Payer: Self-pay

## 2024-06-24 ENCOUNTER — Encounter: Payer: Self-pay | Admitting: Family

## 2024-06-24 ENCOUNTER — Ambulatory Visit: Admitting: Family

## 2024-06-24 DIAGNOSIS — M5416 Radiculopathy, lumbar region: Secondary | ICD-10-CM

## 2024-06-24 MED ORDER — PREDNISONE 50 MG PO TABS: ORAL_TABLET | ORAL | 0 refills | Status: DC

## 2024-06-24 NOTE — Progress Notes (Unsigned)
 Office Visit Note   Patient: Emily Parrish           Date of Birth: 04/24/1946           MRN: [id number] Visit Date: 06/24/2024              Requested by: Frann Mabel Mt, DO [address] [place],  [place] [address] PCP: Frann Mabel Mt, DO  Chief Complaint  Patient presents with   Right Leg - Pain      HPI: The patient is a 78 year old woman seen today for concern of right leg pain this has been ongoing for up to 6 months now.  She is complaining of pain in the anterior right leg which radiates from her groin down to her knee it sometimes radiates from the knee all the way to the foot and ankle  She did have a fall in the bathroom when she was stepping out of the shower the rug went out from under her and she slipped forward, her legs were quickly spread from her she did not actually fall to the ground but she felt that she had pulled a muscle in her lower back at that time she also had some back pain at the time  She reports that when she is walking or wakes each morning she does not have any issue she has been able to walk 2 to 3 miles with friends but after a prolonged walk she begins to have shooting pain from the groin only on the right difficulty getting from a seated to a standing position after rest after a long walk.  This even occurred after slow walking such as Christmas shopping.  Denies any weakness no red flag symptoms no low back pain  Assessment & Plan: Visit Diagnoses:  1. Lumbar radiculopathy     Plan: Concern For lumbar radiculopathy right sided placed on a prednisone  burst  Follow-Up Instructions: No follow-ups on file.   Back Exam   Tenderness  The patient is experiencing no tenderness.   Range of Motion  The patient has normal back ROM.  Muscle Strength  The patient has normal back strength.  Tests  Straight leg raise right: positive Straight leg raise left: negative  Other  Gait: normal        Patient is alert, oriented, no adenopathy, well-dressed, normal affect, normal respiratory effort.     Imaging: No results found. No images are attached to the encounter.  Labs: Lab Results  Component Value Date   HGBA1C 5.9 06/29/2020   HGBA1C 5.8 07/30/2019   HGBA1C 5.4 07/30/2018   LABORGA ESCHERICHIA COLI 04/08/2014     Lab Results  Component Value Date   ALBUMIN 3.9 08/24/2023   ALBUMIN 4.0 07/18/2023   ALBUMIN 4.0 07/17/2023    No results found for: MG Lab Results  Component Value Date   VD25OH 48.89 07/18/2023   VD25OH 40.10 07/12/2022   VD25OH 35.22 07/07/2021    No results found for: PREALBUMIN    Latest Ref Rng & Units 04/11/2024    1:58 PM 07/17/2023    7:07 AM 06/20/2023   10:31 AM  CBC EXTENDED  WBC 3.8 - 10.8 Thousand/uL 3.5  4.1  3.2   RBC 3.80 - 5.10 Million/uL 4.24  4.12  4.09   Hemoglobin 11.7 - 15.5 g/dL 86.4  86.6  86.3   HCT 35.0 - 45.0 % 42.2  40.1  40.0   Platelets 140 - 400 Thousand/uL 150  136.0  140.0   NEUT# 1.4 - 7.7 K/uL  1.3    Lymph# 0.7 - 4.0 K/uL  2.3       There is no height or weight on file to calculate BMI.  Orders:  Orders Placed This Encounter  Procedures   XR Lumbar Spine 2-3 Views   No orders of the defined types were placed in this encounter.    Procedures: No procedures performed  Clinical Data: No additional findings.  ROS:  All other systems negative, except as noted in the HPI. Review of Systems  Objective: Vital Signs: LMP 07/10/1996   Specialty Comments:  No specialty comments available.  PMFS History: Patient Active Problem List   Diagnosis Date Noted   Right acute serous otitis media 01/12/2023   Acute cystitis without hematuria 01/12/2023   Status post insertion of hypoglossal nerve stimulator 11/21/2021   Excessive daytime sleepiness 08/25/2020   Mouth dryness 08/25/2020   Intolerance of continuous positive airway pressure (CPAP) ventilation 08/25/2020   GAD  (generalized anxiety disorder) 04/21/2019   Vitamin D  deficiency 04/21/2019   PCP NOTES >>> 04/13/2015   Mixed hyperlipidemia 04/08/2014   Special screening for malignant neoplasms, colon 02/10/2014   DJD (degenerative joint disease) 04/30/2012   Multinodular goiter 03/31/2012   Annual physical exam 10/17/2011   GERD (gastroesophageal reflux disease) 09/19/2011   OSA (obstructive sleep apnea) 12/07/2010   OTHER CHRONIC NONALCOHOLIC LIVER DISEASE 03/31/2010   VITAMIN B12 DEFICIENCY 02/15/2010   Leukocytopenia 02/14/2010   GERD 01/21/2009   Diaphragmatic hernia 01/18/2009   Urethral stricture 02/20/2007   Anxiety state 12/10/2006   POSTMENOPAUSAL STATUS 12/10/2006   FATIGUE, CHRONIC 12/10/2006   Past Medical History:  Diagnosis Date   Anxiety    Esophageal reflux    Fatty liver     NASH--s/p liver Bx 2011, early portal fibrosis    Goiter    Hiatal hernia    OSA (obstructive sleep apnea) 12/07/2010    does not use CPAP   Urethral stricture    uretheral stricture , s/p dilatation per urology ~ 2005   Vitamin B 12 deficiency    mild    Vitamin D  deficiency     Family History  Problem Relation Age of Onset   Coronary artery disease Maternal Grandfather    Breast cancer Maternal Aunt        died at age 53   Scleroderma Mother    Emphysema Father    Alcohol abuse Father    Stroke Brother    Colon cancer Neg Hx    Diabetes Neg Hx     Past Surgical History:  Procedure Laterality Date   APPENDECTOMY     BIOPSY THYROID   2016   Consistent w/ benign follicular nodule   DRUG INDUCED ENDOSCOPY N/A 12/15/2020   Procedure: DRUG INDUCED ENDOSCOPY;  Surgeon: Carlie Clark, MD;  Location: [facility] OR;  Service: ENT;  Laterality: N/A;   ESOPHAGOGASTRODUODENOSCOPY  2005   HH   IMPLANTATION OF HYPOGLOSSAL NERVE STIMULATOR Right 06/08/2021   Procedure: IMPLANTATION OF HYPOGLOSSAL NERVE STIMULATOR;  Surgeon: Carlie Clark, MD;  Location: [facility];  Service: ENT;  Laterality:  Right;   Social History   Occupational History   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

## 2024-06-28 ENCOUNTER — Other Ambulatory Visit: Payer: Self-pay | Admitting: Family Medicine

## 2024-07-14 HISTORY — PX: CATARACT EXTRACTION: SUR2

## 2024-07-18 ENCOUNTER — Encounter: Payer: Self-pay | Admitting: Family Medicine

## 2024-07-18 ENCOUNTER — Ambulatory Visit: Payer: Self-pay | Admitting: Family Medicine

## 2024-07-18 ENCOUNTER — Ambulatory Visit: Admitting: Family Medicine

## 2024-07-18 VITALS — BP 122/74 | HR 93 | Temp 98.0°F | Resp 16 | Ht 71.0 in | Wt 169.2 lb

## 2024-07-18 DIAGNOSIS — Z0000 Encounter for general adult medical examination without abnormal findings: Secondary | ICD-10-CM

## 2024-07-18 DIAGNOSIS — E782 Mixed hyperlipidemia: Secondary | ICD-10-CM | POA: Diagnosis not present

## 2024-07-18 DIAGNOSIS — F411 Generalized anxiety disorder: Secondary | ICD-10-CM

## 2024-07-18 DIAGNOSIS — E559 Vitamin D deficiency, unspecified: Secondary | ICD-10-CM | POA: Diagnosis not present

## 2024-07-18 DIAGNOSIS — M79604 Pain in right leg: Secondary | ICD-10-CM

## 2024-07-18 LAB — CBC
HCT: 39.8 % (ref 36.0–46.0)
Hemoglobin: 13.3 g/dL (ref 12.0–15.0)
MCHC: 33.4 g/dL (ref 30.0–36.0)
MCV: 96.8 fl (ref 78.0–100.0)
Platelets: 138 K/uL — ABNORMAL LOW (ref 150.0–400.0)
RBC: 4.11 Mil/uL (ref 3.87–5.11)
RDW: 13.9 % (ref 11.5–15.5)
WBC: 3.6 K/uL — ABNORMAL LOW (ref 4.0–10.5)

## 2024-07-18 LAB — COMPREHENSIVE METABOLIC PANEL WITH GFR
ALT: 38 U/L — ABNORMAL HIGH (ref 3–35)
AST: 54 U/L — ABNORMAL HIGH (ref 5–37)
Albumin: 4 g/dL (ref 3.5–5.2)
Alkaline Phosphatase: 48 U/L (ref 39–117)
BUN: 24 mg/dL — ABNORMAL HIGH (ref 6–23)
CO2: 30 meq/L (ref 19–32)
Calcium: 9.3 mg/dL (ref 8.4–10.5)
Chloride: 104 meq/L (ref 96–112)
Creatinine, Ser: 1.2 mg/dL (ref 0.40–1.20)
GFR: 42.89 mL/min — ABNORMAL LOW
Glucose, Bld: 100 mg/dL — ABNORMAL HIGH (ref 70–99)
Potassium: 4.2 meq/L (ref 3.5–5.1)
Sodium: 139 meq/L (ref 135–145)
Total Bilirubin: 0.5 mg/dL (ref 0.2–1.2)
Total Protein: 7.1 g/dL (ref 6.0–8.3)

## 2024-07-18 LAB — LIPID PANEL
Cholesterol: 185 mg/dL (ref 28–200)
HDL: 57.4 mg/dL
LDL Cholesterol: 111 mg/dL — ABNORMAL HIGH (ref 10–99)
NonHDL: 127.49
Total CHOL/HDL Ratio: 3
Triglycerides: 82 mg/dL (ref 10.0–149.0)
VLDL: 16.4 mg/dL (ref 0.0–40.0)

## 2024-07-18 LAB — VITAMIN D 25 HYDROXY (VIT D DEFICIENCY, FRACTURES): VITD: 55.5 ng/mL (ref 30.00–100.00)

## 2024-07-18 MED ORDER — ROSUVASTATIN CALCIUM 5 MG PO TABS: 5.0000 mg | ORAL_TABLET | Freq: Every day | ORAL | 3 refills | Status: AC

## 2024-07-18 NOTE — Patient Instructions (Addendum)
 Give us  2-3 business days to get the results of your labs back.   Keep the diet clean and stay active.  If you do not hear anything about your referral in the next 1-2 weeks, call our office and ask for an update.  Sleep Hygiene Tips: Do not watch TV or look at screens within 1 hour of going to bed. If you do, make sure there is a blue light filter (nighttime mode) involved. Try to go to bed around the same time every night. Wake up at the same time within 1 hour of regular time. Ex: If you wake up at 7 AM for work, do not sleep past 8 AM on days that you don't work. Do not drink alcohol before bedtime. Do not consume caffeine-containing beverages after noon or within 9 hours of intended bedtime. Get regular exercise/physical activity in your life, but not within 2 hours of planned bedtime. Do not take naps.  Do not eat within 2 hours of planned bedtime. Melatonin, 3-5 mg 30-60 minutes before planned bedtime may be helpful.  The bed should be for sleep or sex only. If after 20-30 minutes you are unable to fall asleep, get up and do something relaxing. Do this until you feel ready to go to sleep again.   Let us  know if you need anything.

## 2024-07-18 NOTE — Progress Notes (Signed)
 Chief Complaint  Patient presents with   Annual Exam    CPE     Well Woman Emily Parrish is here for a complete physical.   Her last physical was >1 year ago.  Current diet: in general, a healthy diet. Current exercise: Walking. Weight is stable and she denies daytime fatigue. Seatbelt? Yes Advanced directive? Yes  Health Maintenance Shingrix- Yes DEXA- Yes Tetanus- Yes Pneumonia- Yes  GAD Patient reports mood is very stable and in a good spot.  She is compliant with her Celexa  20 mg daily.  No adverse effects.  No homicidal or suicidal ideation.  No self-medication.  She is not following with therapist/counselor.  Hyperlipidemia Patient presents for mixed hyperlipidemia follow up. Currently being treated with Tricor  48 mg daily, Crestor  5 mg daily and compliance with treatment thus far has been good. She denies myalgias. Diet/exercise as above. The patient is not known to have coexisting coronary artery disease.  Past Medical History:  Diagnosis Date   Anxiety    Esophageal reflux    Fatty liver     NASH--s/p liver Bx 2011, early portal fibrosis    Goiter    Hiatal hernia    OSA (obstructive sleep apnea) 12/07/2010    does not use CPAP   Urethral stricture    uretheral stricture , s/p dilatation per urology ~ 2005   Vitamin B 12 deficiency    mild    Vitamin D  deficiency      Past Surgical History:  Procedure Laterality Date   APPENDECTOMY     BIOPSY THYROID   2016   Consistent w/ benign follicular nodule   DRUG INDUCED ENDOSCOPY N/A 12/15/2020   Procedure: DRUG INDUCED ENDOSCOPY;  Surgeon: Carlie Clark, MD;  Location: [facility] OR;  Service: ENT;  Laterality: N/A;   ESOPHAGOGASTRODUODENOSCOPY  2005   HH   IMPLANTATION OF HYPOGLOSSAL NERVE STIMULATOR Right 06/08/2021   Procedure: IMPLANTATION OF HYPOGLOSSAL NERVE STIMULATOR;  Surgeon: Carlie Clark, MD;  Location: [facility];  Service: ENT;  Laterality: Right;    Medications  Medications  Ordered Prior to Encounter[1]   Allergies Allergies[2]  Review of Systems: Constitutional:  no fevers Eye:  no recent significant change in vision Ears:  No changes in hearing Nose/Mouth/Throat:  no complaints of nasal congestion, no sore throat Cardiovascular: no chest pain Respiratory:  No shortness of breath Gastrointestinal:  No change in bowel habits GU:  Female: negative for dysuria Integumentary:  no abnormal skin lesions reported Neurologic:  no headaches Endocrine:  denies unexplained weight changes  Exam BP 122/74 (BP Location: Left Arm, Patient Position: Sitting)   Pulse 93   Temp 98 F (36.7 C) (Oral)   Resp 16   Ht 5' 11 (1.803 m)   Wt 169 lb 3.2 oz (76.7 kg)   LMP 07/10/1996   SpO2 98%   BMI 23.60 kg/m  General:  well developed, well nourished, in no apparent distress Skin:  no significant moles, warts, or growths Head:  no masses, lesions, or tenderness Eyes:  pupils equal and round, sclera anicteric without injection Ears:  canals without lesions, TMs shiny without retraction, no obvious effusion, no erythema Nose:  nares patent, mucosa normal, and no drainage Throat/Pharynx:  lips and gingiva without lesion; tongue and uvula midline; non-inflamed pharynx; no exudates or postnasal drainage Neck: neck supple without adenopathy, thyromegaly, or masses Lungs:  clear to auscultation, breath sounds equal bilaterally, no respiratory distress Cardio:  regular rate and rhythm, no bruits or LE  edema Abdomen:  abdomen soft, nontender; bowel sounds normal; no masses or organomegaly Genital: Deferred Neuro:  gait normal; deep tendon reflexes normal and symmetric Psych: well oriented with normal range of affect and appropriate judgment/insight  Assessment and Plan  Well adult exam  Right leg pain - Plan: Ambulatory referral to Physical Therapy  Mixed hyperlipidemia - Plan: CBC, Comprehensive metabolic panel with GFR, Lipid panel  Vitamin D  deficiency - Plan:  VITAMIN D  25 Hydroxy (Vit-D Deficiency, Fractures)  GAD (generalized anxiety disorder)   Well 79 y.o. female. Counseled on diet and exercise. Right leg pain: Chronic issue that is not improving.  Refer to physical therapy at her request.  She has an appointment with orthopedic surgery coming up. Mixed hyperlipidemia: Chronic, stable.  Continue Crestor  5 mg daily, Tricor  40 mg daily. GAD: Chronic, stable.  Continue Celexa  20 mg daily. Other orders as above. Follow up in 1 year. The patient voiced understanding and agreement to the plan.  Mabel Mt Romney, DO 07/18/2024 11:13 AM     [1]  Current Outpatient Medications on File Prior to Visit  Medication Sig Dispense Refill   albuterol  (VENTOLIN  HFA) 108 (90 Base) MCG/ACT inhaler Inhale 2 puffs into the lungs every 6 (six) hours as needed for wheezing or shortness of breath. 2 each 6   caffeine 200 MG TABS tablet Take 400 mg by mouth daily.     Calcium -Magnesium-Vitamin D  300-20-200 MG-MG-UNIT CHEW Chew 1 capsule by mouth daily.     citalopram  (CELEXA ) 20 MG tablet TAKE 1 TABLET DAILY 90 tablet 3   Coenzyme Q10 (COQ-10 PO) Take 1 tablet by mouth daily.     Docosahexaenoic Acid (DHA OMEGA 3 PO) Take 2 capsules by mouth 2 (two) times daily.     fenofibrate  (TRICOR ) 48 MG tablet TAKE 1 TABLET DAILY 90 tablet 0   LUTEIN PO Take 1 capsule by mouth 2 (two) times daily.     Milk Thistle 1000 MG CAPS Take 1,000 mg by mouth daily.     Multiple Vitamins-Minerals (MULTIVITAMIN PO) Take 1 tablet by mouth daily.     RABEprazole  (ACIPHEX ) 20 MG tablet Take 1 tablet (20 mg total) by mouth daily. Office visit for further refills 90 tablet 0   No current facility-administered medications on file prior to visit.  [2]  Allergies Allergen Reactions   Fluticasone -Salmeterol     rapid heart rate, medication is inhalent

## 2024-07-18 NOTE — Addendum Note (Signed)
 Addended by: FRANN MABEL SQUIBB on: 07/18/2024 11:13 AM   Modules accepted: Level of Service

## 2024-07-23 ENCOUNTER — Ambulatory Visit: Admitting: Sports Medicine

## 2024-07-23 ENCOUNTER — Ambulatory Visit: Admitting: Family

## 2024-07-23 ENCOUNTER — Encounter: Payer: Self-pay | Admitting: Internal Medicine

## 2024-07-23 ENCOUNTER — Other Ambulatory Visit: Payer: Self-pay

## 2024-07-23 ENCOUNTER — Encounter: Payer: Self-pay | Admitting: Family

## 2024-07-23 ENCOUNTER — Encounter: Payer: Self-pay | Admitting: Sports Medicine

## 2024-07-23 DIAGNOSIS — M1611 Unilateral primary osteoarthritis, right hip: Secondary | ICD-10-CM | POA: Diagnosis not present

## 2024-07-23 DIAGNOSIS — R1031 Right lower quadrant pain: Secondary | ICD-10-CM | POA: Diagnosis not present

## 2024-07-23 DIAGNOSIS — M25551 Pain in right hip: Secondary | ICD-10-CM | POA: Diagnosis not present

## 2024-07-23 DIAGNOSIS — G8929 Other chronic pain: Secondary | ICD-10-CM

## 2024-07-23 MED ORDER — METHYLPREDNISOLONE ACETATE 40 MG/ML IJ SUSP
60.0000 mg | INTRAMUSCULAR | Status: AC | PRN
  Administered 2024-07-23: 60 mg via INTRA_ARTICULAR

## 2024-07-23 MED ORDER — LIDOCAINE HCL 1 % IJ SOLN
4.0000 mL | INTRAMUSCULAR | Status: AC | PRN
  Administered 2024-07-23: 4 mL

## 2024-07-23 NOTE — Progress Notes (Signed)
" ° °  Procedure Note  Patient: Emily Parrish             Date of Birth: 03-26-1946           MRN: [id number]             Visit Date: 07/23/2024  Procedures: Visit Diagnoses:  1. Unilateral primary osteoarthritis, right hip   2. Pain in right hip    Large Joint Inj: R hip joint on 07/23/2024 9:33 AM Indications: pain Details: 22 G 3.5 in needle, ultrasound-guided anterior approach Medications: 4 mL lidocaine  1 %; 60 mg methylPREDNISolone  acetate 40 MG/ML Outcome: tolerated well, no immediate complications  Procedure: US -guided intra-articular hip injection, Right After discussion on risks/benefits/indications and informed verbal consent was obtained, a timeout was performed. Patient was lying supine on exam table. The hip was cleaned with betadine and alcohol swabs. Then utilizing ultrasound guidance, the patient's femoral head and neck junction was identified and subsequently injected with 4:1.5 lidocaine :depomedrol via an in-plane approach with ultrasound visualization of the injectate administered into the hip joint. Patient tolerated procedure well without immediate complications.  Procedure, treatment alternatives, risks and benefits explained, specific risks discussed. Consent was given by the patient. Immediately prior to procedure a time out was called to verify the correct patient, procedure, equipment, support staff and site/side marked as required. Patient was prepped and draped in the usual sterile fashion.     - patient tolerated procedure well, discussed post-injection protocol - follow-up with Erin Zimora for hip as indicated; I am happy to see her as needed  Lonell Sprang, DO Primary Care Sports Medicine Physician  [facility] - Orthopedics  This note was dictated using Dragon naturally speaking software and may contain errors in syntax, spelling, or content which have not been identified prior to signing this note.     "

## 2024-07-23 NOTE — Progress Notes (Signed)
 "  Office Visit Note   Patient: Emily Parrish           Date of Birth: January 20, 1946           MRN: [id number] Visit Date: 07/23/2024              Requested by: Frann Mabel Mt, DO [address] [place],  [place] [address] PCP: Frann Mabel Mt, DO  Chief Complaint  Patient presents with   Lower Back - Follow-up      HPI: The patient is an 79 year old woman who presents today in follow-up for radiating pain from her groin down to her knee she completed a course of prednisone  in December without any improvement or change in her symptoms.  She describes the pain as a knifelike stabbing sensation to her groin which radiates down the anterior medial thigh to the knee she does not have significant pain when she walks but after walking a few miles for exercise she will have significant pain getting from a seated to a standing position after this  Assessment & Plan: Visit Diagnoses:  1. Chronic groin pain, right   2. Unilateral primary osteoarthritis, right hip     Plan: Dr. Burnetta has agreed to proceed with intra-articular hip injection  At this point the patient would like to exhaust all conservative measures for her right hip osteoarthritis  Follow-Up Instructions: Return in about 4 weeks (around 08/20/2024), or if symptoms worsen or fail to improve, for c blackman or gil.   Right Hip Exam   Tenderness  The patient is experiencing no tenderness.   Range of Motion  Extension:  normal  Flexion:  normal  Right hip internal rotation: Painful.   Muscle Strength  The patient has normal right hip strength.  Other  Pulse: present   Back Exam   Tenderness  The patient is experiencing no tenderness.   Muscle Strength  The patient has normal back strength.      Patient is alert, oriented, no adenopathy, well-dressed, normal affect, normal respiratory effort.     Imaging: No results found. No images are attached to the  encounter.  Labs: Lab Results  Component Value Date   HGBA1C 5.9 06/29/2020   HGBA1C 5.8 07/30/2019   HGBA1C 5.4 07/30/2018   LABORGA ESCHERICHIA COLI 04/08/2014     Lab Results  Component Value Date   ALBUMIN 4.0 07/18/2024   ALBUMIN 3.9 08/24/2023   ALBUMIN 4.0 07/18/2023    No results found for: MG Lab Results  Component Value Date   VD25OH 55.50 07/18/2024   VD25OH 48.89 07/18/2023   VD25OH 40.10 07/12/2022    No results found for: PREALBUMIN    Latest Ref Rng & Units 07/18/2024    9:00 AM 04/11/2024    1:58 PM 07/17/2023    7:07 AM  CBC EXTENDED  WBC 4.0 - 10.5 K/uL 3.6  3.5  4.1   RBC 3.87 - 5.11 Mil/uL 4.11  4.24  4.12   Hemoglobin 12.0 - 15.0 g/dL 86.6  86.4  86.6   HCT 36.0 - 46.0 % 39.8  42.2  40.1   Platelets 150.0 - 400.0 K/uL 138.0  150  136.0   NEUT# 1.4 - 7.7 K/uL   1.3   Lymph# 0.7 - 4.0 K/uL   2.3      There is no height or weight on file to calculate BMI.  Orders:  No orders of the defined types were placed in this encounter.  No orders of the defined types were placed in this encounter.    Procedures: No procedures performed  Clinical Data: No additional findings.  ROS:  All other systems negative, except as noted in the HPI. Review of Systems  Objective: Vital Signs: LMP 07/10/1996   Specialty Comments:  No specialty comments available.  PMFS History: Patient Active Problem List   Diagnosis Date Noted   Right acute serous otitis media 01/12/2023   Acute cystitis without hematuria 01/12/2023   Status post insertion of hypoglossal nerve stimulator 11/21/2021   Excessive daytime sleepiness 08/25/2020   Mouth dryness 08/25/2020   Intolerance of continuous positive airway pressure (CPAP) ventilation 08/25/2020   GAD (generalized anxiety disorder) 04/21/2019   Vitamin D  deficiency 04/21/2019   PCP NOTES >>> 04/13/2015   Mixed hyperlipidemia 04/08/2014   Special screening for malignant neoplasms, colon 02/10/2014   DJD  (degenerative joint disease) 04/30/2012   Multinodular goiter 03/31/2012   Annual physical exam 10/17/2011   GERD (gastroesophageal reflux disease) 09/19/2011   OSA (obstructive sleep apnea) 12/07/2010   OTHER CHRONIC NONALCOHOLIC LIVER DISEASE 03/31/2010   VITAMIN B12 DEFICIENCY 02/15/2010   Leukocytopenia 02/14/2010   GERD 01/21/2009   Diaphragmatic hernia 01/18/2009   Urethral stricture 02/20/2007   Anxiety state 12/10/2006   POSTMENOPAUSAL STATUS 12/10/2006   FATIGUE, CHRONIC 12/10/2006   Past Medical History:  Diagnosis Date   Anxiety    Esophageal reflux    Fatty liver     NASH--s/p liver Bx 2011, early portal fibrosis    Goiter    Hiatal hernia    OSA (obstructive sleep apnea) 12/07/2010    does not use CPAP   Urethral stricture    uretheral stricture , s/p dilatation per urology ~ 2005   Vitamin B 12 deficiency    mild    Vitamin D  deficiency     Family History  Problem Relation Age of Onset   Coronary artery disease Maternal Grandfather    Breast cancer Maternal Aunt        died at age 53   Scleroderma Mother    Emphysema Father    Alcohol abuse Father    Stroke Brother    Colon cancer Neg Hx    Diabetes Neg Hx     Past Surgical History:  Procedure Laterality Date   APPENDECTOMY     BIOPSY THYROID   2016   Consistent w/ benign follicular nodule   DRUG INDUCED ENDOSCOPY N/A 12/15/2020   Procedure: DRUG INDUCED ENDOSCOPY;  Surgeon: Carlie Clark, MD;  Location: [facility] OR;  Service: ENT;  Laterality: N/A;   ESOPHAGOGASTRODUODENOSCOPY  2005   HH   IMPLANTATION OF HYPOGLOSSAL NERVE STIMULATOR Right 06/08/2021   Procedure: IMPLANTATION OF HYPOGLOSSAL NERVE STIMULATOR;  Surgeon: Carlie Clark, MD;  Location: [facility];  Service: ENT;  Laterality: Right;   Social History   Occupational History   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

## 2024-07-24 ENCOUNTER — Other Ambulatory Visit: Payer: Self-pay | Admitting: Family Medicine

## 2024-07-24 ENCOUNTER — Encounter: Payer: Self-pay | Admitting: Family Medicine

## 2024-07-25 ENCOUNTER — Other Ambulatory Visit: Payer: Self-pay

## 2024-07-25 DIAGNOSIS — M25551 Pain in right hip: Secondary | ICD-10-CM

## 2024-07-28 ENCOUNTER — Ambulatory Visit: Admitting: *Deleted

## 2024-07-28 VITALS — Ht 71.0 in | Wt 169.0 lb

## 2024-07-28 DIAGNOSIS — Z0000 Encounter for general adult medical examination without abnormal findings: Secondary | ICD-10-CM | POA: Diagnosis not present

## 2024-07-28 DIAGNOSIS — Z780 Asymptomatic menopausal state: Secondary | ICD-10-CM | POA: Diagnosis not present

## 2024-07-28 NOTE — Progress Notes (Signed)
 "  Chief Complaint  Patient presents with   Medicare Wellness     Subjective:   Emily Parrish is a 79 y.o. female who presents for a Medicare Annual Wellness Visit.  Visit info / Clinical Intake: Medicare Wellness Visit Type:: Subsequent Annual Wellness Visit Persons participating in visit and providing information:: patient Medicare Wellness Visit Mode:: Telephone If telephone:: video declined Since this visit was completed virtually, some vitals may be partially provided or unavailable. Missing vitals are due to the limitations of the virtual format.: Unable to obtain vitals - no equipment If Telephone or Video please confirm:: I connected with patient using audio/video enable telemedicine. I verified patient identity with two identifiers, discussed telehealth limitations, and patient agreed to proceed. Patient Location:: home Provider Location:: office Interpreter Needed?: No Pre-visit prep was completed: yes AWV questionnaire completed by patient prior to visit?: yes Date:: 07/26/24 Living arrangements:: (!) (Patient-Rptd) lives alone Patient's Overall Health Status Rating: (Patient-Rptd) very good Typical amount of pain: (Patient-Rptd) some Does pain affect daily life?: (Patient-Rptd) no Are you currently prescribed opioids?: no  Dietary Habits and Nutritional Risks How many meals a day?: (Patient-Rptd) 2 Eats fruit and vegetables daily?: (Patient-Rptd) yes Most meals are obtained by: (Patient-Rptd) preparing own meals In the last 2 weeks, have you had any of the following?: none Diabetic:: no  Functional Status Activities of Daily Living (to include ambulation/medication): (Patient-Rptd) Independent Ambulation: (Patient-Rptd) Independent Medication Administration: (Patient-Rptd) Independent Home Management (perform basic housework or laundry): (Patient-Rptd) Independent Manage your own finances?: (Patient-Rptd) yes Primary transportation is: (Patient-Rptd)  driving Concerns about vision?: no *vision screening is required for WTM* Concerns about hearing?: no Uses hearing aids?: (!) yes  Fall Screening Falls in the past year?: (Patient-Rptd) 0 Number of falls in past year: 0 Was there an injury with Fall?: 0 Fall Risk Category Calculator: 0 Patient Fall Risk Level: Low Fall Risk  Fall Risk Patient at Risk for Falls Due to: Other (Comment) (DJD) Fall risk Follow up: Falls evaluation completed  Home and Transportation Safety: All rugs have non-skid backing?: (Patient-Rptd) yes All stairs or steps have railings?: (Patient-Rptd) N/A, no stairs Grab bars in the bathtub or shower?: (!) (Patient-Rptd) no Have non-skid surface in bathtub or shower?: (Patient-Rptd) yes Good home lighting?: (Patient-Rptd) yes Regular seat belt use?: (Patient-Rptd) yes Hospital stays in the last year:: (Patient-Rptd) no  Cognitive Assessment Difficulty concentrating, remembering, or making decisions? : no Will 6CIT or Mini Cog be Completed: no 6CIT or Mini Cog Declined: patient declined (Just had comprehensive memory testing 2 months ago and told that I was above average for my age-- no concerns found)  Advance Directives (For Healthcare) Does Patient Have a Medical Advance Directive?: Yes Does patient want to make changes to medical advance directive?: No - Patient declined Type of Advance Directive: Living will Copy of Living Will in Chart?: Yes - validated most recent copy scanned in chart (See row information)  Reviewed/Updated  Reviewed/Updated: Reviewed All (Medical, Surgical, Family, Medications, Allergies, Care Teams, Patient Goals)    Allergies (verified) Fluticasone -salmeterol   Current Medications (verified) Outpatient Encounter Medications as of 07/28/2024  Medication Sig   albuterol  (VENTOLIN  HFA) 108 (90 Base) MCG/ACT inhaler Inhale 2 puffs into the lungs every 6 (six) hours as needed for wheezing or shortness of breath.   caffeine 200  MG TABS tablet Take 400 mg by mouth daily.   Calcium -Magnesium-Vitamin D  300-20-200 MG-MG-UNIT CHEW Chew 1 capsule by mouth daily.   citalopram  (CELEXA ) 20 MG tablet TAKE  1 TABLET DAILY   Coenzyme Q10 (COQ-10 PO) Take 1 tablet by mouth daily.   Docosahexaenoic Acid (DHA OMEGA 3 PO) Take 2 capsules by mouth 2 (two) times daily.   fenofibrate  (TRICOR ) 48 MG tablet TAKE 1 TABLET DAILY   LUTEIN PO Take 1 capsule by mouth 2 (two) times daily.   Milk Thistle 1000 MG CAPS Take 1,000 mg by mouth daily.   Multiple Vitamins-Minerals (MULTIVITAMIN PO) Take 1 tablet by mouth daily.   RABEprazole  (ACIPHEX ) 20 MG tablet Take 1 tablet (20 mg total) by mouth daily. Office visit for further refills   rosuvastatin  (CRESTOR ) 5 MG tablet Take 1 tablet (5 mg total) by mouth daily.   No facility-administered encounter medications on file as of 07/28/2024.    History: Past Medical History:  Diagnosis Date   Anxiety    Esophageal reflux    Fatty liver     NASH--s/p liver Bx 2011, early portal fibrosis    Goiter    Hiatal hernia    OSA (obstructive sleep apnea) 12/07/2010    does not use CPAP   Urethral stricture    uretheral stricture , s/p dilatation per urology ~ 2005   Vitamin B 12 deficiency    mild    Vitamin D  deficiency    Past Surgical History:  Procedure Laterality Date   APPENDECTOMY     BIOPSY THYROID   2016   Consistent w/ benign follicular nodule   CATARACT EXTRACTION Right 07/14/2024   07/29/24 -- left   DRUG INDUCED ENDOSCOPY N/A 12/15/2020   Procedure: DRUG INDUCED ENDOSCOPY;  Surgeon: Carlie Clark, MD;  Location: [facility] OR;  Service: ENT;  Laterality: N/A;   ESOPHAGOGASTRODUODENOSCOPY  2005   HH   IMPLANTATION OF HYPOGLOSSAL NERVE STIMULATOR Right 06/08/2021   Procedure: IMPLANTATION OF HYPOGLOSSAL NERVE STIMULATOR;  Surgeon: Carlie Clark, MD;  Location: [facility];  Service: ENT;  Laterality: Right;   Family History  Problem Relation Age of Onset   Coronary artery  disease Maternal Grandfather    Breast cancer Maternal Aunt        died at age 76   Scleroderma Mother    Emphysema Father    Alcohol abuse Father    Stroke Brother    Colon cancer Neg Hx    Diabetes Neg Hx    Social History   Occupational History   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, dispensing  -- independent  Tobacco Use   Smoking status: Former    Current packs/day: 0.00    Types: Cigarettes    Start date: 07/10/1961    Quit date: 07/11/1971    Years since quitting: 53.0   Smokeless tobacco: Never   Tobacco comments:    used to smoke--1 1/2 pack per week  Vaping Use   Vaping status: Never Used  Substance and Sexual Activity   Alcohol use: Not Currently    Comment: social, drinks non-alcoholic beverages   Drug use: No   Sexual activity: Not Currently    Partners: Male    Birth control/protection: Post-menopausal   Tobacco Counseling Counseling given: Not Answered Tobacco comments: used to smoke--1 1/2 pack per week  SDOH Screenings   Food Insecurity: No Food Insecurity (07/28/2024)  Housing: Low Risk (07/28/2024)  Transportation Needs: No Transportation Needs (07/28/2024)  Utilities: Not At Risk (07/28/2024)  Alcohol Screen: Low Risk (07/11/2024)  Depression (PHQ2-9): Low Risk (07/28/2024)  Financial Resource Strain: Low Risk (07/11/2024)  Physical Activity: Sufficiently Active (07/28/2024)  Social Connections: Socially  Isolated (07/28/2024)  Stress: No Stress Concern Present (07/28/2024)  Tobacco Use: Medium Risk (07/28/2024)   See flowsheets for full screening details  Depression Screen PHQ 2 & 9 Depression Scale- Over the past 2 weeks, how often have you been bothered by any of the following problems? Little interest or pleasure in doing things: 0 Feeling down, depressed, or hopeless (PHQ Adolescent also includes...irritable): 1 PHQ-2 Total Score: 1 Trouble falling or staying asleep, or sleeping too much: 0 Feeling tired or having little energy: 0 Poor  appetite or overeating (PHQ Adolescent also includes...weight loss): 0 Feeling bad about yourself - or that you are a failure or have let yourself or your family down: 0 Trouble concentrating on things, such as reading the newspaper or watching television (PHQ Adolescent also includes...like school work): 0 Moving or speaking so slowly that other people could have noticed. Or the opposite - being so fidgety or restless that you have been moving around a lot more than usual: 0 Thoughts that you would be better off dead, or of hurting yourself in some way: 0 PHQ-9 Total Score: 1 If you checked off any problems, how difficult have these problems made it for you to do your work, take care of things at home, or get along with other people?: Not difficult at all     Goals Addressed             This Visit's Progress    Healthy Lifestyle   On track    Continue to eat heart healthy diet (full of fruits, vegetables, whole grains, lean protein, water--limit salt, fat, and sugar intake) and increase physical activity as tolerated. Continue doing brain stimulating activities (puzzles, reading, adult coloring books, staying active) to keep memory sharp.               Objective:    Today's Vitals   07/28/24 1339  Weight: 169 lb (76.7 kg)  Height: 5' 11 (1.803 m)   Body mass index is 23.57 kg/m.  Hearing/Vision screen No results found. Immunizations and Health Maintenance Health Maintenance  Topic Date Due   COVID-19 Vaccine (9 - 2025-26 season) 10/13/2024   Medicare Annual Wellness (AWV)  07/28/2025   DTaP/Tdap/Td (4 - Tdap) 09/23/2033   Pneumococcal Vaccine: 50+ Years  Completed   Influenza Vaccine  Completed   Bone Density Scan  Completed   Zoster Vaccines- Shingrix  Completed   Meningococcal B Vaccine  Aged Out   Mammogram  Discontinued   Colonoscopy  Discontinued   Hepatitis C Screening  Discontinued        Assessment/Plan:  This is a routine wellness examination for  Emily Parrish.  Patient Care Team: Frann Mabel Mt, DO as PCP - General (Family Medicine) Watt Rush, MD as Consulting Physician (Urology) Rodgers Barnie RAMAN, CNM as Consulting Physician (Certified Nurse Midwife) Octavia Charleston, MD as Consulting Physician (Ophthalmology) Vernetta Lonni GRADE, MD as Consulting Physician (Orthopedic Surgery) Abran Rush SAILOR, MD as Consulting Physician (Gastroenterology) Hays Morrison, CRNP as Nurse Practitioner Lesavage, Manuelita Crank, DPM as Referring Physician (Podiatry) Hunsucker, Donnice SAUNDERS, MD as Consulting Physician (Pulmonary Disease)  I have personally reviewed and noted the following in the patients chart:   Medical and social history Use of alcohol, tobacco or illicit drugs  Current medications and supplements including opioid prescriptions. Functional ability and status Nutritional status Physical activity Advanced directives List of other physicians Hospitalizations, surgeries, and ER visits in previous 12 months Vitals Screenings to include cognitive, depression, and falls Referrals and  appointments  No orders of the defined types were placed in this encounter.  In addition, I have reviewed and discussed with patient certain preventive protocols, quality metrics, and best practice recommendations. A written personalized care plan for preventive services as well as general preventive health recommendations were provided to patient.   Lolita Libra, CMA   07/28/2024   Return in 1 year (on 07/28/2025).  After Visit Summary: (MyChart) Due to this being a telephonic visit, the after visit summary with patients personalized plan was offered to patient via MyChart   Nurse Notes: HM Addressed: DEXA ordered See phone note  "

## 2024-07-28 NOTE — Patient Instructions (Addendum)
 Emily Parrish,  Thank you for taking the time for your Medicare Wellness Visit. I appreciate your continued commitment to your health goals. Please review the care plan we discussed, and feel free to reach out if I can assist you further.  Please note that Annual Wellness Visits do not include a physical exam. Some assessments may be limited, especially if the visit was conducted virtually. If needed, we may recommend an in-person follow-up with your provider.  Goal: Continue to eat heart healthy diet (full of fruits, vegetables, whole grains, lean protein, water--limit salt, fat, and sugar intake) and increase physical activity as tolerated. Continue doing brain stimulating activities (puzzles, reading, adult coloring books, staying active) to keep memory sharp.   Ongoing Care Seeing your primary care provider every 3 to 6 months helps us  monitor your health and provide consistent, personalized care.   Dr Frann: due 07/19/25, physical. Please call [phone] to schedule. Medicare AWV: Due 07/29/25. Please call [phone] to schedule.  Referrals If a referral was made during today's visit and you haven't received any updates within two weeks, please contact the referred provider directly to check on the status.  Bone Density [facility]):  [phone]   Recommended Screenings: Health Maintenance  Topic Date Due   Medicare Annual Wellness Visit  04/27/2017   COVID-19 Vaccine (9 - 2025-26 season) 10/13/2024   DTaP/Tdap/Td vaccine (4 - Tdap) 09/23/2033   Pneumococcal Vaccine for age over 51  Completed   Flu Shot  Completed   Osteoporosis screening with Bone Density Scan  Completed   Zoster (Shingles) Vaccine  Completed   Meningitis B Vaccine  Aged Out   Breast Cancer Screening  Discontinued   Colon Cancer Screening  Discontinued   Hepatitis C Screening  Discontinued       07/26/2024    9:19 AM  Advanced Directives  Does Patient Have a Medical Advance Directive? Yes  Type of  Advance Directive Living will  Does patient want to make changes to medical advance directive? No - Patient declined    Vision: Annual vision screenings are recommended for early detection of glaucoma, cataracts, and diabetic retinopathy. These exams can also reveal signs of chronic conditions such as diabetes and high blood pressure.  Dental: Annual dental screenings help detect early signs of oral cancer, gum disease, and other conditions linked to overall health, including heart disease and diabetes.  Please see the attached documents for additional preventive care recommendations.

## 2024-08-20 ENCOUNTER — Ambulatory Visit: Admitting: Orthopaedic Surgery

## 2024-08-28 ENCOUNTER — Ambulatory Visit: Admitting: Internal Medicine

## 2024-10-27 ENCOUNTER — Ambulatory Visit (HOSPITAL_COMMUNITY): Admit: 2024-10-27 | Admitting: Orthopedic Surgery
